# Patient Record
Sex: Female | Born: 1935 | Race: White | Hispanic: No | Smoking: Current every day smoker
Health system: Southern US, Community
[De-identification: ages and names within clinical notes are randomized; demographics above are authoritative.]

## PROBLEM LIST (undated history)

## (undated) DIAGNOSIS — I639 Cerebral infarction, unspecified: Secondary | ICD-10-CM

## (undated) DIAGNOSIS — R600 Localized edema: Secondary | ICD-10-CM

## (undated) DIAGNOSIS — E7800 Pure hypercholesterolemia, unspecified: Secondary | ICD-10-CM

## (undated) DIAGNOSIS — E079 Disorder of thyroid, unspecified: Secondary | ICD-10-CM

## (undated) DIAGNOSIS — I10 Essential (primary) hypertension: Secondary | ICD-10-CM

## (undated) DIAGNOSIS — I050 Rheumatic mitral stenosis: Secondary | ICD-10-CM

## (undated) DIAGNOSIS — I499 Cardiac arrhythmia, unspecified: Secondary | ICD-10-CM

## (undated) HISTORY — DX: Disorder of thyroid, unspecified: E07.9

## (undated) HISTORY — DX: Rheumatic mitral stenosis: I05.0

## (undated) HISTORY — DX: Cardiac arrhythmia, unspecified: I49.9

## (undated) HISTORY — DX: Localized edema: R60.0

---

## 1994-10-16 DIAGNOSIS — I050 Rheumatic mitral stenosis: Secondary | ICD-10-CM

## 1994-10-16 HISTORY — DX: Rheumatic mitral stenosis: I05.0

## 1994-10-16 HISTORY — PX: MITRAL VALVE SURGERY: SHX714

## 2004-03-08 HISTORY — PX: NM MYOCAR PERF WALL MOTION: HXRAD629

## 2009-11-06 ENCOUNTER — Emergency Department (HOSPITAL_COMMUNITY): Admission: EM | Admit: 2009-11-06 | Discharge: 2009-11-06 | Payer: Self-pay | Admitting: Emergency Medicine

## 2010-08-04 DIAGNOSIS — E039 Hypothyroidism, unspecified: Secondary | ICD-10-CM | POA: Insufficient documentation

## 2010-08-17 DIAGNOSIS — I639 Cerebral infarction, unspecified: Secondary | ICD-10-CM

## 2010-08-17 HISTORY — DX: Cerebral infarction, unspecified: I63.9

## 2010-08-17 HISTORY — PX: TRANSESOPHAGEAL ECHOCARDIOGRAM: SHX273

## 2010-09-05 ENCOUNTER — Inpatient Hospital Stay (HOSPITAL_COMMUNITY)
Admission: EM | Admit: 2010-09-05 | Discharge: 2010-09-09 | DRG: 066 | Disposition: A | Discharge status: Home / Self Care | Payer: Medicare Other | Attending: Internal Medicine | Admitting: Internal Medicine

## 2010-09-05 ENCOUNTER — Emergency Department (HOSPITAL_COMMUNITY): Payer: Medicare Other

## 2010-09-05 DIAGNOSIS — R791 Abnormal coagulation profile: Secondary | ICD-10-CM | POA: Diagnosis present

## 2010-09-05 DIAGNOSIS — F17200 Nicotine dependence, unspecified, uncomplicated: Secondary | ICD-10-CM | POA: Diagnosis present

## 2010-09-05 DIAGNOSIS — M216X9 Other acquired deformities of unspecified foot: Secondary | ICD-10-CM | POA: Diagnosis present

## 2010-09-05 DIAGNOSIS — Z79899 Other long term (current) drug therapy: Secondary | ICD-10-CM

## 2010-09-05 DIAGNOSIS — E039 Hypothyroidism, unspecified: Secondary | ICD-10-CM | POA: Diagnosis present

## 2010-09-05 DIAGNOSIS — I4891 Unspecified atrial fibrillation: Secondary | ICD-10-CM | POA: Diagnosis present

## 2010-09-05 DIAGNOSIS — Z954 Presence of other heart-valve replacement: Secondary | ICD-10-CM

## 2010-09-05 DIAGNOSIS — Z8673 Personal history of transient ischemic attack (TIA), and cerebral infarction without residual deficits: Secondary | ICD-10-CM

## 2010-09-05 DIAGNOSIS — Z7901 Long term (current) use of anticoagulants: Secondary | ICD-10-CM

## 2010-09-05 DIAGNOSIS — E785 Hyperlipidemia, unspecified: Secondary | ICD-10-CM | POA: Diagnosis present

## 2010-09-05 DIAGNOSIS — I498 Other specified cardiac arrhythmias: Secondary | ICD-10-CM | POA: Diagnosis not present

## 2010-09-05 DIAGNOSIS — I6340 Cerebral infarction due to embolism of unspecified cerebral artery: Principal | ICD-10-CM | POA: Diagnosis present

## 2010-09-05 DIAGNOSIS — Z23 Encounter for immunization: Secondary | ICD-10-CM

## 2010-09-05 DIAGNOSIS — I672 Cerebral atherosclerosis: Secondary | ICD-10-CM | POA: Diagnosis present

## 2010-09-05 DIAGNOSIS — I10 Essential (primary) hypertension: Secondary | ICD-10-CM | POA: Diagnosis present

## 2010-09-05 DIAGNOSIS — I6529 Occlusion and stenosis of unspecified carotid artery: Secondary | ICD-10-CM | POA: Diagnosis present

## 2010-09-05 LAB — CBC
Hemoglobin: 13.5 g/dL (ref 12.0–15.0)
MCHC: 32.5 g/dL (ref 30.0–36.0)
Platelets: 159 10*3/uL (ref 150–400)
RDW: 14.7 % (ref 11.5–15.5)

## 2010-09-05 LAB — COMPREHENSIVE METABOLIC PANEL
AST: 27 U/L (ref 0–37)
Albumin: 3.4 g/dL — ABNORMAL LOW (ref 3.5–5.2)
Alkaline Phosphatase: 56 U/L (ref 39–117)
BUN: 18 mg/dL (ref 6–23)
CO2: 29 mEq/L (ref 19–32)
Creatinine, Ser: 0.74 mg/dL (ref 0.4–1.2)
GFR calc Af Amer: >60 mL/min (ref >60)
GFR calc non Af Amer: >60 mL/min (ref >60)
Sodium: 137 mEq/L (ref 135–145)

## 2010-09-05 LAB — CK TOTAL AND CKMB (NOT AT ARMC)
CK, MB: 2.5 ng/mL (ref 0.3–4.0)
Total CK: 116 U/L (ref 7–177)

## 2010-09-05 LAB — HEMOGLOBIN A1C: Hgb A1c MFr Bld: 5.6 % (ref <5.7)

## 2010-09-05 MED ORDER — GADOBENATE DIMEGLUMINE 529 MG/ML IV SOLN
15.0000 mL | Freq: Once | INTRAVENOUS | Status: AC
  Administered 2010-09-05: 15 mL via INTRAVENOUS

## 2010-09-05 NOTE — H&P (Signed)
NAME:  Tina Wade, Tina Wade NO.:  [id number]  MEDICAL RECORD NO.:  [id number]           PATIENT TYPE:  E  LOCATION:  [facility]                         FACILITY:  [facility]  PHYSICIAN:  Jeoffrey Massed, MD    DATE OF BIRTH:  1929-05-07  DATE OF ADMISSION:  09/05/2010 DATE OF DISCHARGE:                             HISTORY & PHYSICAL   PRIMARY CARE PRACTITIONER:  Maryelizabeth Rowan, MD.  PRIMARY CARDIOLOGIST:  Garen Lah, MD, [facility], [facility].  CHIEF COMPLAINT:  Right-sided weakness since she woke up early this a.m.  HISTORY OF PRESENT ILLNESS:  The patient is a very pleasant 77 year old left-handed female with a past medical history of atrial fibrillation, history of mitral valve replacement with a mechanical valve on chronic Coumadin therapy, dyslipidemia, hypertension, smoker, comes in with the above-noted complaint.  Please note that this patient was last seen normal yesterday before she went to bed.  This morning when she woke up, she realized she had right-sided weakness.  She claims her legs were much more weaker than her right upper extremity.  She claims that she could not even stand and had difficulty walking to the bathroom.  To walk to the bathroom, she had to mostly lean on her left side and hold on to wall or different objects to get to the bathroom.  She denies any visual changes.  Denies any speech difficulty.  Denies any shortness of breath, chest pain, nausea, vomiting, diarrhea, or headaches.  Denies any urinary problems as well.  The patient does not have any fever, no cough as well.  The patient claims she has had TIAs twice in the past few years.  ALLERGIES:  The patient denies any allergies.  PAST MEDICAL HISTORY: 1. Hypertension. 2. Hypothyroidism. 3. Transient ischemic attacks x2 in the past few years. 4. Atrial fibrillation. 5. Dyslipidemia. 6. On chronic Coumadin therapy for atrial fibrillation and mechanical     mitral  valve. 7. Questionable history of carotid stenosis on the left side.  PAST SURGICAL HISTORY: 1. Tonsillectomy. 2. Status post mechanical/metallic valve replacement 17 years ago.  HOME MEDICATIONS:  Include, 1. Captopril. 2. Digoxin. 3. Lasix. 4. Levothyroxine. 5. Vytorin. 6. Coumadin.  FAMILY HISTORY:  Her sister had a stroke a few years back, otherwise nonsignificant to this current condition.  SOCIAL HISTORY:  The patient continues to smoke and now she is cut down to around 7-10 cigarettes a day and she has been a smoker for around 50 years.  She denies any toxic habits.  REVIEW OF SYSTEMS:  A detailed review of 12 systems were done and these are negative except for the ones mentioned in the HPI.  PHYSICAL EXAM:  VITAL SIGNS:  Last set of vital signs shows a heart rate of 72, blood pressure 124/67, respirations of 14, pulse ox of 96% on room air, afebrile. GENERAL:  An elderly white female lying in bed, does not appear to be any distress.  There is no facial droop.  She has clear speech. HEENT:  Atraumatic, normocephalic.  Pupils are equally reactive to light and accommodation.  Oral mucosa is moist. NECK:  Supple.  No JVD. CHEST:  Bilaterally clear to auscultation. CARDIOVASCULAR:  Heart sounds are regular.  No murmurs heard. ABDOMEN:  Soft, nontender, nondistended. EXTREMITIES:  No edema, no cyanosis. SKIN:  No rash. NEUROLOGIC:  The patient is alert and awake.  She is able to follow all commands and is able to communicate with very clear speech.  She has no gross sensory deficits.  Her motor strength on her left upper and left lower extremity are 5/5.  Her strength on her right upper extremity is around 4+/5.  Her strength on the left lower extremity is around 4/5 with no obvious footdrop on the right side.  Her plantars are equivocal, but tend to go up bilaterally.  She has brisk reflexes on the deep tendon reflexes, particularly on the knee on her right side  compared to the left.  LABORATORY DATA:  CBC shows WBC of 7.6, hemoglobin of 13.5, hematocrit of 41.5, MCV of 93.5, and platelet count of 159.  INR is 2.25. Chemistry shows sodium of 137, potassium of 4.2, chloride of 102, bicarb of 29, glucose of 105, BUN of 18, creatinine of 0.74.  LFT shows a total bilirubin of 0.7, alkaline phosphatase of 56, AST of 27, ALT of 16, total protein of 7.0, albumin of 3.5, and calcium of 9.0.  Digoxin level is 0.9.  RADIOLOGICAL STUDIES:  CT of the head done on 2010-10-02, shows no acute intracranial hemorrhage.  Remote infarction, small vessel disease type changes without CT evidence of large acute infarct as noted above.  EKG shows atrial fibrillation with a rate around 70 beats per minute.  ASSESSMENT: 1. Sudden onset of right-sided weakness, mostly resolved now with     mostly a persistant right footdrop, given her symptomatology obviously     cerebrovascular accident is a concern. 2. Coagulopathy secondary to chronic Coumadin therapy. 3. Rate-controlled atrial fibrillation, on chronic Coumadin therapy.  PLAN: 1. The patient will be admitted to the Neuro Telemetry Unit. 2. This patient, although she has a metallic mitral valve and dental    implants both of which were placed more than 10 years ago has had     an MRI of her brain done last year without any incident, so we will     go ahead and repeat a MRI/MRA of her brain.  We will also obtain an     MRA of her neck.  We will also go ahead and order a 2-D echo as     well. 3. We will place her on aspirin and continue her on simvastatin. 4. A Neurology consultation has also been called for. 5. Further plan will depend as the patient's clinical situation     evolves, and further input from Neurology as well as further     results pending radiological studies. 6. The patient will be monitored and followed closely on a day-to-day     basis. 7. DVT prophylaxis is not needed as her INR is  therapeutic.  CODE STATUS:  The patient is a full code.  Total time spent 45 minutes.     Jeoffrey Massed, MD     SG/MEDQ  D:  Oct 02, 2010  T:  2010-10-02  Job:  [id number]  cc:   Maryelizabeth Rowan, M.D. Rollene Rotunda, MD, [facility]  Electronically Signed by Jeoffrey Massed  on 02-Oct-2010 04:37:11 PM

## 2010-09-06 ENCOUNTER — Inpatient Hospital Stay (HOSPITAL_COMMUNITY): Payer: Medicare Other

## 2010-09-06 LAB — PROTIME-INR: INR: 2.49 — ABNORMAL HIGH (ref 0.00–1.49)

## 2010-09-06 LAB — URINALYSIS, MICROSCOPIC ONLY
Urobilinogen, UA: 0.2 mg/dL (ref 0.0–1.0)
pH: 7 (ref 5.0–8.0)

## 2010-09-06 LAB — GLUCOSE, CAPILLARY: Glucose-Capillary: 94 mg/dL (ref 70–99)

## 2010-09-06 LAB — LIPID PANEL
Cholesterol: 114 mg/dL (ref 0–200)
LDL Cholesterol: 52 mg/dL (ref 0–99)
Triglycerides: 101 mg/dL (ref <150)

## 2010-09-07 LAB — GLUCOSE, CAPILLARY
Glucose-Capillary: 105 mg/dL — ABNORMAL HIGH (ref 70–99)
Glucose-Capillary: 91 mg/dL (ref 70–99)
Glucose-Capillary: 125 mg/dL — ABNORMAL HIGH (ref 70–99)
Glucose-Capillary: 110 mg/dL — ABNORMAL HIGH (ref 70–99)

## 2010-09-07 LAB — PROTIME-INR: Prothrombin Time: 28 seconds — ABNORMAL HIGH (ref 11.6–15.2)

## 2010-09-08 LAB — DIGOXIN LEVEL: Digoxin Level: 1.1 ng/mL (ref 0.8–2.0)

## 2010-09-08 LAB — GLUCOSE, CAPILLARY

## 2010-09-08 LAB — MAGNESIUM: Magnesium: 2.5 mg/dL (ref 1.5–2.5)

## 2010-09-08 LAB — POTASSIUM: Potassium: 4.5 mEq/L (ref 3.5–5.1)

## 2010-09-09 LAB — GLUCOSE, CAPILLARY: Glucose-Capillary: 106 mg/dL — ABNORMAL HIGH (ref 70–99)

## 2010-09-09 LAB — CBC
HCT: 41.9 % (ref 36.0–46.0)
Hemoglobin: 13.6 g/dL (ref 12.0–15.0)
MCH: 30.9 pg (ref 26.0–34.0)
RBC: 4.4 MIL/uL (ref 3.87–5.11)

## 2010-09-09 LAB — PROTIME-INR: Prothrombin Time: 30.2 seconds — ABNORMAL HIGH (ref 11.6–15.2)

## 2010-09-10 NOTE — Discharge Summary (Signed)
NAME:  Tina Wade, Tina Wade              ACCOUNT NO.:  [id number]  MEDICAL RECORD NO.:  [id number]           PATIENT TYPE:  I  LOCATION:  3005                         FACILITY:  [facility]  PHYSICIAN:  Andreas Blower, MD       DATE OF BIRTH:  11-16-1928  DATE OF ADMISSION:  09/05/2010 DATE OF DISCHARGE:  09/09/2010                              DISCHARGE SUMMARY   PRIMARY CARE PHYSICIAN:  Dr. Duanne Guess.  PRIMARY CARDIOLOGIST:  [facility].  DISCHARGE DIAGNOSES: 1. Multifocal areas of acute cortical infarcts. 2. Bradycardia/slow heart rate. 3. History of atrial fibrillation. 4. Hypothyroidism. 5. History of transient ischemic attacks x2. 6. Hyperlipidemia. 7. On chronic anticoagulation for atrial fibrillation and mechanical     mitral valve with slow INR between 3 and 3.5. 8. Mechanical mitral valve. 9. A 50-75% stenosis of proximal left internal carotid artery.  DISCHARGE MEDICATIONS: 1. Aspirin 81 mg p.o. daily 2. Polyethylene glycol 17 g p.o. daily. 3. Warfarin 7.5 mg p.o. daily. 4. Acetaminophen 325 mg 1-2 tablets 3 times a day as needed. 5. Calcium carbonate over the counter 1 tablet p.o. daily. 6. Captopril 12.5 mg p.o. 3 times a day. 7. Furosemide 40 mg p.o. daily 8. Levothyroxine 112 mcg p.o. daily. 9. Miconazole vaginal cream 1 application topically daily as needed     for yeast infections. 10.Multivitamins 1 tablet p.o. daily. 11.Ezetimibe/simvastatin 10/20 one tablet p.o. daily.  RADIOLOGY/IMAGING: 1. The patient had a CT of the head without contrast, which shows no     intracranial hemorrhage.  Remote infarcts and small-vessel disease     type changes without CT evidence of large acute infarct. 2. The patient had an MRA of the head without contrast, MRA of the     neck with and without contrast and MRI of the head with and without     contrast on September 05, 2010, which showed multifocal areas of     acute cortical infarctions, question emboli.  The  patient has left     posterior frontal parasagittal acute infarct, which correlates with     right foot weakness. 3. MRA of the head is negative.  MRA of the neck showed 50-70%     stenosis in the proximal left internal carotid artery. 4. The patient had chest x-ray 2-view, which shows no acute     cardiopulmonary process status post aortic valve replacement. 5. The patient had a transesophageal echocardiogram on September 08, 2010, which showed that her left ventricle had mild concentric     hypertrophy.  Systolic function was normal.  Ejection fraction was     55-60%.  Wall motion was normal.  Aortic valve showed moderate     regurgitation, mitral valve showed mildly calcified annulus.  A St.     Jude mechanical prosthesis was present and was functioning     normally.  Left atrium showed spontaneous echo contrast (smoke in     the cavity).  The source of embolus likely from the left atrium.  LABORATORY DATA:  CBC shows white count of 7.7, hemoglobin 13.6, hematocrit 41.9, platelet count 161.  INR is 2.88.  Electrolytes normal with a creatinine of 0.74.  Liver function tests normal except albumin is 3.48, magnesium is 2.5.  Hemoglobin A1c is 5.6.  LDL is 52.  UA was negative for nitrites and leukocytes.  CONSULTATIONS:  [facility] was consulted during the course of the hospital stay and Neurology was consulted during the course of the hospital stay.  DISPOSITION AND FOLLOWUP:  The patient to follow up with her primary care physician in 1 week.  The patient to follow up with Dr. Lynnea Ferrier, office will call for appointment, date and time.  The patient to have her anticoagulation checked at Dr. Donavan Burnet office early next week. Dr. Donavan Burnet office will call with an appointment, date and time.  HOSPITAL COURSE BY PROBLEM: 1. Acute multifocal CVA, most likely from left atrium.  Neurology and     Cardiology was consulted.  The above imaging was obtained.      Neurology and Cardiology agreed that her goal INR should be     increased to 3.0-3.5.  In regards to her right foot drop, a brace     was obtained and the patient ambulated with the brace.  The patient     will receive outpatient physical therapy and occupational therapy. 2. Bradycardia/slow heart rate.  During the course of the hospital     stay, the patient had a slow heart rate and was bradycardic while     the patient was getting a TEE.  She was bradycardic with a heart     rate in 30s.  Cardiology recommended discontinuing the digoxin.     Further followup with Dr. Lynnea Ferrier, Cardiology as outpatient. 3. AFib.  The patient anticoagulated.  Heart rate is controlled. 4. Hypothyroidism.  Continue levothyroxine. 5. Hypertension, stable.  Continue the patient on home medications.     Further titration of antihypertensive medications to be done as     outpatient.  Time spent on discharge talking to the patient, talking to the consultants, and coordinating care was 35 minutes.     Andreas Blower, MD     SR/MEDQ  D:  09/09/2010  T:  09/10/2010  Job:  [id number]  Electronically Signed by Wardell Heath Artelia Game  on 09/10/2010 08:08:05 PM

## 2010-09-14 NOTE — Consult Note (Signed)
NAME:  Tina Wade, Tina Wade              ACCOUNT NO.:  [id number]  MEDICAL RECORD NO.:  [id number]           PATIENT TYPE:  I  LOCATION:  3005                         FACILITY:  [facility]  PHYSICIAN:  Ritta Slot, MD     DATE OF BIRTH:  16-Jan-1929  DATE OF CONSULTATION:  09/06/2010 DATE OF DISCHARGE:                                CONSULTATION   HISTORY OF PRESENT ILLNESS:  The patient is a pleasant 77 year old female who had been seen by our group back in March 2011.  She has a history of valvular heart disease and had a St. Jude Mitral valve implanted in [place] in 1996.  She had no coronary disease.  She then moved to [facility] and had spent last several years out there.  She recently moved back to the area.  She was seen in our office and set up for an echocardiogram, which showed good LV function and no significant valvular abnormalities.  Dr.Eichorne who saw her at that time did offer her a Myoview because she has some risk factors for coronary disease, but the patient declined.  She has some moderate carotid disease and we did do carotid Doppler and lower extremity arterial Doppler.  Lower extremity arterial Doppler revealed ABIs of 1.1 on the right and 1.07 on the left.  A carotid Doppler showed less than 49% left internal carotid artery narrowing.  Previous adenosine Myoview had been done in [facility] in 2005 and showed a question of mild ischemia in the anterior apical walls.  This was felt to be a subtle defect.  The patient has not had symptoms of angina.  She had been followed by Dr. Larina Bras and then her care was transferred to Dr. Maryelizabeth Rowan in December 2011.  I called Dr. Chauncy Passy office, they had seen her on August 04, 2010 and her INR was 2.5.  The patient was admitted to the emergency room on September 05, 2010 with numbness and weakness in the right arm and right leg.  Her INR on admission was 2.49.  MRI showed multiple areas of acute cortical  infarct, question embolic.  The patient has improved since admission, but continues to have some very mild right upper extremity grip weakness and still some significant right lower extremity weakness.  She says her INR has been checked monthly and has been stable.  As noted above, the last INR I have is from August 04, 2010 and it was 2.5.  We were asked to see her for further evaluation.  PAST MEDICAL HISTORY:  Remarkable for chronic atrial fibrillation with controlled ventricular response.  She has treated hypothyroidism.  She has treated hypertension.  She has treated dyslipidemia.  HOME MEDICATIONS: 1. Capoten 12.5 t.i.d. 2. Lasix 40 mg a day. 3. Coumadin 5 mg a day. 4. Vytorin 10/20 daily. 5. Multivitamin daily. 6. Synthroid 0.112 mg a day. 7. Lanoxin 0.25 mg a day.  ALLERGIES:  She has no known drug allergies.  SOCIAL HISTORY:  She is a widow.  She has 6 children, 12 grandchildren. She smokes a half-pack a day and drinks alcohol occasionally.  FAMILY HISTORY:  Remarkable that the patient's mother died at 81 from coronary disease, which was diagnosed in her late 21s.  The patient's father died at 56 of TB.  REVIEW OF SYSTEMS:  Essentially unremarkable except for noted above. Please see history and physical for complete details.  She did complain to me of some right ear pain and asked that to evaluate this.  PHYSICAL EXAM:  VITAL SIGNS:  Blood pressure 134/75, pulse 68, temp 97.6, room air sat is 94%. GENERAL:  She is an elderly female, in no acute distress. HEENT:  Grossly normocephalic.  Extraocular movements are intact. Sclerae are nonicteric.  I did examine her ears, the right ear was very tender and I could not get a good look in canal, there was cerumen noted.  There is no obvious mass noted.  There was no obvious erythema. The left ear exam was essentially normal with some wax noted in the canal, but no obstruction. NECK:  Without JVD or bruit. CHEST:  Clear to  auscultation and percussion. CARDIAC EXAM:  Reveals irregularly irregular rhythm with a soft systolic murmur and positive bowel sounds. ABDOMEN:  Not tender, not distended. EXTREMITIES:  Without edema.  Distal pulses are faint, but intact. NEURO EXAM:  Grossly intact, she does have some mild grip weakness compared to the left in the upper extremities and some right footdrop noted.  She is awake, alert, oriented, and cooperative. SKIN:  Cool and dry.  LABORATORY DATA:  INR is 2.49, INR on admission was 2.25.  Sodium 137, potassium 4.2, BUN 18, creatinine 0.74.  Liver functions are normal. Hemoglobin A1c is 5.6.  CK and troponin are negative.  Cholesterol is 111, HDL 42, LDL 52.  MRI shows multiple areas of acute cortical infarct, question embolic.  MRA of the neck shows a 50-75% left internal carotid artery stenosis.  Carotid Doppler have been done and the results are pending.  EKG shows atrial fibrillation with controlled ventricular response and narrow complex QRS LVH by voltage.  IMPRESSION: 1. Embolic cerebrovascular accident in the setting of subtherapeutic     INR of 2.25. 2. St. Jude mitral valve in [place], [place], in 1996. 3. Moderate vascular disease in the left internal carotid artery,     Doppler are pending, but Doppler in our office in October of 2011     showed less than 49% narrowing. 4. Smoking history. 5. Treated hypothyroidism. 6. Treated dyslipidemia. 7. Treated hypertension. 8. Mildly abnormal Myoview in 2005 with no history of coronary disease     and no history of chest pain.  PLAN:  The patient will be seen by cardiologist this afternoon.  She will need closer followup of her INR with a goal of 2.5 to 3.5, although as noted at Dr. Chauncy Passy office her INR was 2.5.     Abelino Derrick, P.A.   ______________________________ Ritta Slot, MD    LKK/MEDQ  D:  09/06/2010  T:  09/06/2010  Job:  [id number]  cc:   Ritta Slot, MD Maryelizabeth Rowan, M.D.  Electronically Signed by Corine Shelter P.A. on 09/07/2010 12:26:54 PM Electronically Signed by Ritta Slot MD on 09/14/2010 02:27:14 PM

## 2010-09-15 ENCOUNTER — Ambulatory Visit: Payer: Medicare Other | Attending: Internal Medicine

## 2010-09-15 DIAGNOSIS — M6281 Muscle weakness (generalized): Secondary | ICD-10-CM | POA: Insufficient documentation

## 2010-09-15 DIAGNOSIS — IMO0001 Reserved for inherently not codable concepts without codable children: Secondary | ICD-10-CM | POA: Insufficient documentation

## 2010-09-15 DIAGNOSIS — R269 Unspecified abnormalities of gait and mobility: Secondary | ICD-10-CM | POA: Insufficient documentation

## 2010-09-19 ENCOUNTER — Ambulatory Visit: Payer: Medicare Other

## 2010-09-21 ENCOUNTER — Ambulatory Visit: Payer: Medicare Other

## 2010-09-26 ENCOUNTER — Ambulatory Visit: Payer: Medicare Other

## 2010-09-28 ENCOUNTER — Ambulatory Visit: Payer: Medicare Other

## 2010-10-04 LAB — POCT CARDIAC MARKERS: Myoglobin, poc: 171 ng/mL (ref 12–200)

## 2010-10-04 LAB — COMPREHENSIVE METABOLIC PANEL
ALT: 24 U/L (ref 0–35)
Alkaline Phosphatase: 72 U/L (ref 39–117)
CO2: 32 mEq/L (ref 19–32)
GFR calc non Af Amer: >60 mL/min (ref >60)
Glucose, Bld: 109 mg/dL — ABNORMAL HIGH (ref 70–99)
Potassium: 4 mEq/L (ref 3.5–5.1)
Sodium: 139 mEq/L (ref 135–145)

## 2010-10-04 LAB — URINALYSIS, ROUTINE W REFLEX MICROSCOPIC
Bilirubin Urine: NEGATIVE
Hgb urine dipstick: NEGATIVE
Ketones, ur: NEGATIVE mg/dL
Protein, ur: NEGATIVE mg/dL
Specific Gravity, Urine: 1.007 (ref 1.005–1.030)
Urobilinogen, UA: 0.2 mg/dL (ref 0.0–1.0)

## 2010-10-04 LAB — CBC
HCT: 46.7 % — ABNORMAL HIGH (ref 36.0–46.0)
Hemoglobin: 15.3 g/dL — ABNORMAL HIGH (ref 12.0–15.0)
MCHC: 32.7 g/dL (ref 30.0–36.0)
RBC: 5.03 MIL/uL (ref 3.87–5.11)

## 2010-10-04 LAB — DIFFERENTIAL
Basophils Absolute: 0 10*3/uL (ref 0.0–0.1)
Basophils Relative: 0 % (ref 0–1)
Eosinophils Absolute: 0.1 10*3/uL (ref 0.0–0.7)
Neutrophils Relative %: 81 % — ABNORMAL HIGH (ref 43–77)

## 2010-10-04 LAB — PROTIME-INR
INR: 2.5 — ABNORMAL HIGH (ref 0.00–1.49)
Prothrombin Time: 26.8 seconds — ABNORMAL HIGH (ref 11.6–15.2)

## 2010-10-05 ENCOUNTER — Ambulatory Visit: Payer: Medicare Other

## 2010-10-06 ENCOUNTER — Ambulatory Visit: Payer: Medicare Other

## 2010-10-10 ENCOUNTER — Ambulatory Visit: Payer: Medicare Other

## 2010-10-12 ENCOUNTER — Ambulatory Visit: Payer: Medicare Other

## 2010-10-17 ENCOUNTER — Ambulatory Visit: Payer: Medicare Other | Attending: Internal Medicine

## 2010-10-17 DIAGNOSIS — IMO0001 Reserved for inherently not codable concepts without codable children: Secondary | ICD-10-CM | POA: Insufficient documentation

## 2010-10-17 DIAGNOSIS — R269 Unspecified abnormalities of gait and mobility: Secondary | ICD-10-CM | POA: Insufficient documentation

## 2010-10-17 DIAGNOSIS — M6281 Muscle weakness (generalized): Secondary | ICD-10-CM | POA: Insufficient documentation

## 2010-10-18 DIAGNOSIS — Z8673 Personal history of transient ischemic attack (TIA), and cerebral infarction without residual deficits: Secondary | ICD-10-CM | POA: Insufficient documentation

## 2010-10-19 ENCOUNTER — Ambulatory Visit: Payer: Medicare Other

## 2010-10-25 ENCOUNTER — Ambulatory Visit: Payer: Medicare Other

## 2010-10-27 ENCOUNTER — Ambulatory Visit: Payer: Medicare Other

## 2010-10-31 ENCOUNTER — Ambulatory Visit: Payer: Medicare Other

## 2010-11-02 ENCOUNTER — Ambulatory Visit: Payer: Medicare Other

## 2010-11-07 ENCOUNTER — Ambulatory Visit: Payer: Medicare Other

## 2010-11-09 ENCOUNTER — Ambulatory Visit: Payer: Medicare Other

## 2010-11-14 ENCOUNTER — Ambulatory Visit: Payer: Medicare Other

## 2010-11-16 ENCOUNTER — Ambulatory Visit: Payer: Medicare Other | Attending: Internal Medicine

## 2010-11-16 DIAGNOSIS — R269 Unspecified abnormalities of gait and mobility: Secondary | ICD-10-CM | POA: Insufficient documentation

## 2010-11-16 DIAGNOSIS — M6281 Muscle weakness (generalized): Secondary | ICD-10-CM | POA: Insufficient documentation

## 2010-11-16 DIAGNOSIS — IMO0001 Reserved for inherently not codable concepts without codable children: Secondary | ICD-10-CM | POA: Insufficient documentation

## 2010-11-22 ENCOUNTER — Ambulatory Visit: Payer: Medicare Other

## 2010-11-22 NOTE — Consult Note (Signed)
Tina Wade, Tina Wade              ACCOUNT NO.:  [id number]  MEDICAL RECORD NO.:  [id number]           PATIENT TYPE:  I  LOCATION:  3005                         FACILITY:  [facility]  PHYSICIAN:  Melvyn Novas, M.D.  DATE OF BIRTH:  [birth year]  DATE OF CONSULTATION:  09/05/2010 DATE OF DISCHARGE:                                CONSULTATION   CHIEF COMPLAINT:  Right lower extremity weakness.  HISTORY OF PRESENT ILLNESS:  A 77 year old woman with past medical history of mitral valve prolapse, on Coumadin, comes to the ED for right lower extremity weakness.  The patient reports that she had no symptoms before going to sleep.  The patient woke up with right lower extremity weakness.  Denies upper extremity involvement.  Denies trauma, headache, fever, or chills.  The patient had resolved right lower leg weakness, except for a foot drop on the right side.  Neurology consulted for further evaluation.  PAST MEDICAL HISTORY: 1. Hypertension. 2. TIA, April 2011. 3. Hypothyroidism.  SURGICAL HISTORY: 1. Mitral valve replacement. 2. Cataract surgery.  MEDICATIONS: 1. Warfarin 5 mg p.o. daily as directed. 2. Tylenol 325 mg 1-2 tablets p.o. t.i.d. p.r.n. pain. 3. Multivitamin 1 tablet p.o. daily. 4. Calcium carbonate p.o. 1 tablet daily. 5. Levothyroxine 112 mcg p.o. daily. 6. Lasix 40 mg p.o. daily. 7. Digoxin 0.25 p.o. daily. 8. Captopril 12.5 mg p.o. daily. 9. Vytorin 10/20 mg p.o. daily.  ALLERGIES:  No known drug allergies.  FAMILY HISTORY:  Noncontributory.  SOCIAL HISTORY:  Smoking history and occasional alcohol drinker.  REVIEW OF SYSTEMS:  All negative as per HPI.  PHYSICAL EXAM:  VITAL SIGNS:  Blood pressure 163/67, pulse 69, respiratory rate 20, temperature 97.6. MENTAL STATUS:  Alert and oriented x3, carries out two-step commands. CRANIAL NERVES:  Eyes:  EOMI, PERRLA.  Visual field intact. NEUROLOGIC:  Face symmetrical.  Tongue midline.  Uvula midline.   No dysarthria.  Sensation normal.  Shoulder shrug and head turn normal. Coordination:  Finger-to-nose normal.  Heel-to-shin normal.  Fine motor movement normal.  Deep tendon reflexes, right lower extremity 0, all other extremities 1.  Motor:  4/5 right plantar flexion, 3/5 right dorsi flexion, 5/5 strength in lower left extremity, upper left extremity and right upper extremity normal.  Drift none. SKIN:  No rashes. EXTREMITIES:  2+ pulses throughout. PULMONARY:  Clear to auscultation bilaterally. CARDIOVASCULAR:  Regular rate and rhythm.  No murmurs, rubs, or gallops. NECK:  Supple.  Sensation normal.  LABS:  Sodium 137, potassium 4.2, chloride 102, bicarb 29, BUN 18, creatinine 0.74, glucose 105.  White blood cells 7.6, hemoglobin 13.5, hematocrit 41.5, platelets 159.  PT 25, INR 2.25, digoxin 0.9.  IMAGING TEST:  CT of the head, impression: 1. No intracranial hemorrhage. 2. Remote infarct and small vessel disease type changes without CT     evidence of large acute infarct.  ASSESSMENT:  Right foot drop most likely peripheral nerve injury.  Check MRI of the head.  PT consult done for continued physical therapy.  The patient will need a foot brace.  Neurology will follow as needed.     Danne Harbor, MD  ______________________________ Melvyn Novas, M.D.    RV/MEDQ  D:  09/05/2010  T:  09/06/2010  Job:  [id number]  Electronically Signed by Danne Harbor MD on 10/19/2010 01:59:59 PM Electronically Signed by Melvyn Novas M.D. on 11/22/2010 12:05:40 PM

## 2010-12-05 ENCOUNTER — Ambulatory Visit: Payer: Medicare Other

## 2010-12-11 ENCOUNTER — Emergency Department (HOSPITAL_COMMUNITY)
Admission: EM | Admit: 2010-12-11 | Discharge: 2010-12-11 | Disposition: A | Discharge status: Home / Self Care | Payer: Medicare Other | Attending: Emergency Medicine | Admitting: Emergency Medicine

## 2010-12-11 DIAGNOSIS — I10 Essential (primary) hypertension: Secondary | ICD-10-CM | POA: Insufficient documentation

## 2010-12-11 DIAGNOSIS — J3489 Other specified disorders of nose and nasal sinuses: Secondary | ICD-10-CM | POA: Insufficient documentation

## 2010-12-11 DIAGNOSIS — Z8673 Personal history of transient ischemic attack (TIA), and cerebral infarction without residual deficits: Secondary | ICD-10-CM | POA: Insufficient documentation

## 2010-12-11 DIAGNOSIS — E039 Hypothyroidism, unspecified: Secondary | ICD-10-CM | POA: Insufficient documentation

## 2010-12-11 DIAGNOSIS — R040 Epistaxis: Secondary | ICD-10-CM | POA: Insufficient documentation

## 2010-12-11 DIAGNOSIS — Z7901 Long term (current) use of anticoagulants: Secondary | ICD-10-CM | POA: Insufficient documentation

## 2010-12-11 DIAGNOSIS — Z8679 Personal history of other diseases of the circulatory system: Secondary | ICD-10-CM | POA: Insufficient documentation

## 2010-12-11 LAB — PROTIME-INR
INR: 2.46 — ABNORMAL HIGH (ref 0.00–1.49)
Prothrombin Time: 26.8 seconds — ABNORMAL HIGH (ref 11.6–15.2)

## 2010-12-11 LAB — CBC
Hemoglobin: 12.7 g/dL (ref 12.0–15.0)
MCH: 31.1 pg (ref 26.0–34.0)
RBC: 4.09 MIL/uL (ref 3.87–5.11)

## 2010-12-11 LAB — DIFFERENTIAL
Basophils Relative: 0 % (ref 0–1)
Monocytes Relative: 10 % (ref 3–12)
Neutro Abs: 5.6 10*3/uL (ref 1.7–7.7)
Neutrophils Relative %: 68 % (ref 43–77)

## 2011-04-04 DIAGNOSIS — Z87891 Personal history of nicotine dependence: Secondary | ICD-10-CM | POA: Insufficient documentation

## 2011-06-18 ENCOUNTER — Emergency Department (HOSPITAL_COMMUNITY)
Admission: EM | Admit: 2011-06-18 | Discharge: 2011-06-18 | Disposition: A | Discharge status: Home / Self Care | Payer: Medicare Other | Attending: Emergency Medicine | Admitting: Emergency Medicine

## 2011-06-18 ENCOUNTER — Encounter: Payer: Self-pay | Admitting: Emergency Medicine

## 2011-06-18 DIAGNOSIS — Z7982 Long term (current) use of aspirin: Secondary | ICD-10-CM | POA: Insufficient documentation

## 2011-06-18 DIAGNOSIS — Z79899 Other long term (current) drug therapy: Secondary | ICD-10-CM | POA: Insufficient documentation

## 2011-06-18 DIAGNOSIS — R040 Epistaxis: Secondary | ICD-10-CM

## 2011-06-18 DIAGNOSIS — Z8673 Personal history of transient ischemic attack (TIA), and cerebral infarction without residual deficits: Secondary | ICD-10-CM | POA: Insufficient documentation

## 2011-06-18 DIAGNOSIS — E7800 Pure hypercholesterolemia, unspecified: Secondary | ICD-10-CM | POA: Insufficient documentation

## 2011-06-18 DIAGNOSIS — Z7901 Long term (current) use of anticoagulants: Secondary | ICD-10-CM | POA: Insufficient documentation

## 2011-06-18 DIAGNOSIS — I10 Essential (primary) hypertension: Secondary | ICD-10-CM | POA: Insufficient documentation

## 2011-06-18 HISTORY — DX: Pure hypercholesterolemia, unspecified: E78.00

## 2011-06-18 HISTORY — DX: Cerebral infarction, unspecified: I63.9

## 2011-06-18 HISTORY — DX: Essential (primary) hypertension: I10

## 2011-06-18 LAB — CBC
HCT: 32 % — ABNORMAL LOW (ref 36.0–46.0)
Hemoglobin: 10.6 g/dL — ABNORMAL LOW (ref 12.0–15.0)
MCV: 93 fL (ref 78.0–100.0)
RBC: 3.44 MIL/uL — ABNORMAL LOW (ref 3.87–5.11)
RDW: 15.5 % (ref 11.5–15.5)
WBC: 7.6 10*3/uL (ref 4.0–10.5)

## 2011-06-18 LAB — BASIC METABOLIC PANEL
CO2: 30 mEq/L (ref 19–32)
Calcium: 8.6 mg/dL (ref 8.4–10.5)
Potassium: 3.4 mEq/L — ABNORMAL LOW (ref 3.5–5.1)
Sodium: 135 mEq/L (ref 135–145)

## 2011-06-18 LAB — PROTIME-INR: INR: 5.31 (ref 0.00–1.49)

## 2011-06-18 NOTE — ED Provider Notes (Signed)
History     CSN: [id number] Arrival date & time: 06/18/2011  4:34 PM   First MD Initiated Contact with Patient 06/18/11 1758      Chief Complaint  Patient presents with  . Epistaxis    (Consider location/radiation/quality/duration/timing/severity/associated sxs/prior treatment) Patient is a 77 y.o. female presenting with nosebleeds. The history is provided by the patient.  Epistaxis  The current episode started 6 to 12 hours ago (Today at 6:00 in the morning). The problem occurs constantly. The problem has been resolved. The problem is associated with anticoagulants. The bleeding has been from both (But predominantly right side) nares. She has tried applying pressure for the symptoms. Her past medical history is significant for bleeding disorder. Her past medical history does not include sinus problems, nose-picking or frequent nosebleeds.   Patient is on Coumadin status post mitral valve replacement. Her primary care doctors are keeping her INR in a range of 3-3.5, her Coumadin was recently adjusted from taking 5 mg a day to taking 7.5 mg a day on Monday Wednesdays and Friday. Patient is also taking aspirin daily. Currently the nosebleed has stopped that was at approximately 6:00 PM. Patient has a history of nosebleed in the past that occurred [place]. This required packing and she was seen by ear nose and throat doctor there and upon return home follow up with ear nose and throat here. However she cannot remember who it was but she has those records at home. The ear nose and throat doctors discovered that she has a septal defect. However other than the packing no specific therapy was recommended. No history of any bleeding since June until today.   Patient also gives a history of black stool for several days, this has preceded the history of the nosebleed. No other complaints.   Past Medical History  Diagnosis Date  . Stroke   . Hypertension   . High cholesterol      No past surgical history on file.  No family history on file.  History  Substance Use Topics  . Smoking status: Current Everyday Smoker  . Smokeless tobacco: Not on file  . Alcohol Use: No    OB History    Grav Para Term Preterm Abortions TAB SAB Ect Mult Living                  Review of Systems  Constitutional: Negative for fever.  HENT: Positive for nosebleeds. Negative for congestion, trouble swallowing and neck pain.   Eyes: Negative for visual disturbance.  Respiratory: Negative for cough and shortness of breath.   Cardiovascular: Negative for chest pain.  Gastrointestinal: Negative for nausea, vomiting, abdominal pain and diarrhea.  Genitourinary: Negative for dysuria.  Musculoskeletal: Negative for back pain.  Neurological: Negative for weakness and headaches.  Hematological: Bruises/bleeds easily.    Allergies  Review of patient's allergies indicates no known allergies.  Home Medications   Current Outpatient Rx  Name Route Sig Dispense Refill  . ASPIRIN 81 MG PO TABS Oral Take 81 mg by mouth daily.      Marland Kitchen CALCIUM CARBONATE 1500 MG PO TABS Oral Take 1,500 mg by mouth daily.      Marland Kitchen EZETIMIBE-SIMVASTATIN 10-20 MG PO TABS Oral Take 1 tablet by mouth at bedtime.      . FUROSEMIDE 40 MG PO TABS Oral Take 40 mg by mouth 2 (two) times daily.      Marland Kitchen LEVOTHYROXINE SODIUM 112 MCG PO TABS Oral Take 112  mcg by mouth daily.      Marland Kitchen LOSARTAN POTASSIUM 100 MG PO TABS Oral Take 100 mg by mouth daily.      Carma Leaven M PLUS PO TABS Oral Take 1 tablet by mouth daily.      . WARFARIN SODIUM 5 MG PO TABS Oral Take 5-7.5 mg by mouth daily. Take 7.5 mg on mondays, wednesdays and fridays. Take 5 mg the rest of the week.       BP 126/51  Pulse 61  Temp(Src) 97.9 F (36.6 C) (Oral)  Resp 18  SpO2 95%  Physical Exam  Nursing note and vitals reviewed. Constitutional: She is oriented to person, place, and time. She appears well-developed and well-nourished. No distress.  HENT:   Head: Normocephalic and atraumatic.  Mouth/Throat: Oropharynx is clear and moist.       Dried blood around both nares no blood in the hypopharynx area. Blood clot in the right nares. No active bleeding.  Neck: Normal range of motion. Neck supple.  Cardiovascular: Normal rate and regular rhythm.   Pulmonary/Chest: Effort normal and breath sounds normal. She has no rales.  Abdominal: Soft. Bowel sounds are normal. There is no tenderness.  Musculoskeletal: Normal range of motion. She exhibits no edema.  Neurological: She is alert and oriented to person, place, and time. No cranial nerve deficit. She exhibits normal muscle tone. Coordination normal.  Skin: Skin is warm. No rash noted.    ED Course  EPISTAXIS MANAGEMENT Performed by: Shelda Jakes. Authorized by: Shelda Jakes. Risks and benefits: risks, benefits and alternatives were discussed Patient understanding: patient states understanding of the procedure being performed Patient identity confirmed: verbally with patient Time out: Immediately prior to procedure a "time out" was called to verify the correct patient, procedure, equipment, support staff and site/side marked as required. Comments: Rhino Rocket 5.5 cm placed in the right nares. Prior to placement it was greased with bacitracin ointment. After placement in the right nares which patient tolerated well the balloon was inflated the patient felt pressure that she could tolerate.    (including critical care time)  Labs Reviewed  CBC - Abnormal; Notable for the following:    RBC 3.44 (*)    Hemoglobin 10.6 (*)    HCT 32.0 (*)    All other components within normal limits  PROTIME-INR - Abnormal; Notable for the following:    Prothrombin Time 49.4 (*)    INR 5.31 (*)    All other components within normal limits  BASIC METABOLIC PANEL - Abnormal; Notable for the following:    Potassium 3.4 (*)    Glucose, Bld 106 (*)    GFR calc non Af Amer 79 (*)    All other  components within normal limits     1. Epistaxis       MDM   As stated above patient with a history of nosebleed back in June. No nosebleeds since then until today. Onset today was at 6:00 in the morning. But, saw patient this evening the nosebleed did stop it she stated that touch very concerned about a recurring so I Rhino Rocket was placed in her right nares which was the site of the per dominant bleeding. She tolerated placement of Rhino Rocket fine. INR is elevated at 5.3, patient informed of this. She will hold Coumadin and has followup with her primary care provider tomorrow. She will follow up with ear nose and throat doctor that saw her after the incident in June here  locally, to have the packing Rhino Rocket removed in 2-3 days.         Shelda Jakes, MD 06/18/11 [phone]

## 2011-06-18 NOTE — ED Notes (Signed)
Patient is resting comfortably. Nose continues to bleed pt given tissue to hold pressure.Nose bleed cart at the bedside.

## 2011-06-18 NOTE — ED Notes (Signed)
C/o nosebleed since 6am this morning.  Pt states she is taking Coumadin.

## 2011-06-18 NOTE — ED Notes (Signed)
Nasal tampon in r nare w/o s/s bleeding. No c/o's

## 2011-09-13 HISTORY — PX: DOPPLER ECHOCARDIOGRAPHY: SHX263

## 2012-01-10 ENCOUNTER — Ambulatory Visit
Admission: RE | Admit: 2012-01-10 | Discharge: 2012-01-10 | Disposition: A | Discharge status: Home / Self Care | Payer: Medicare Other | Source: Ambulatory Visit | Attending: Family Medicine | Admitting: Family Medicine

## 2012-01-10 ENCOUNTER — Other Ambulatory Visit: Payer: Self-pay | Admitting: Family Medicine

## 2012-01-10 DIAGNOSIS — R05 Cough: Secondary | ICD-10-CM

## 2012-01-10 DIAGNOSIS — R059 Cough, unspecified: Secondary | ICD-10-CM

## 2012-01-11 DIAGNOSIS — I517 Cardiomegaly: Secondary | ICD-10-CM | POA: Insufficient documentation

## 2012-01-26 ENCOUNTER — Other Ambulatory Visit: Payer: Self-pay | Admitting: Family Medicine

## 2012-01-26 DIAGNOSIS — Z1231 Encounter for screening mammogram for malignant neoplasm of breast: Secondary | ICD-10-CM

## 2012-01-26 DIAGNOSIS — Z780 Asymptomatic menopausal state: Secondary | ICD-10-CM

## 2012-02-12 ENCOUNTER — Ambulatory Visit
Admission: RE | Admit: 2012-02-12 | Discharge: 2012-02-12 | Disposition: A | Discharge status: Home / Self Care | Payer: Medicare Other | Source: Ambulatory Visit | Attending: Family Medicine | Admitting: Family Medicine

## 2012-02-12 DIAGNOSIS — Z1231 Encounter for screening mammogram for malignant neoplasm of breast: Secondary | ICD-10-CM

## 2012-02-12 DIAGNOSIS — Z780 Asymptomatic menopausal state: Secondary | ICD-10-CM

## 2012-02-13 DIAGNOSIS — M810 Age-related osteoporosis without current pathological fracture: Secondary | ICD-10-CM | POA: Insufficient documentation

## 2012-04-25 HISTORY — PX: OTHER SURGICAL HISTORY: SHX169

## 2012-06-18 DIAGNOSIS — K921 Melena: Secondary | ICD-10-CM | POA: Insufficient documentation

## 2012-10-03 ENCOUNTER — Ambulatory Visit: Payer: Self-pay | Admitting: Cardiology

## 2012-10-03 DIAGNOSIS — I639 Cerebral infarction, unspecified: Secondary | ICD-10-CM | POA: Insufficient documentation

## 2012-10-03 DIAGNOSIS — Z7901 Long term (current) use of anticoagulants: Secondary | ICD-10-CM | POA: Insufficient documentation

## 2012-10-03 DIAGNOSIS — Z952 Presence of prosthetic heart valve: Secondary | ICD-10-CM | POA: Insufficient documentation

## 2012-12-13 ENCOUNTER — Ambulatory Visit (INDEPENDENT_AMBULATORY_CARE_PROVIDER_SITE_OTHER): Payer: Medicare Other | Admitting: Pharmacist Clinician (PhC)/ Clinical Pharmacy Specialist

## 2012-12-13 VITALS — BP 130/80 | HR 76

## 2012-12-13 DIAGNOSIS — I639 Cerebral infarction, unspecified: Secondary | ICD-10-CM

## 2012-12-13 DIAGNOSIS — Z7901 Long term (current) use of anticoagulants: Secondary | ICD-10-CM

## 2012-12-13 DIAGNOSIS — Z952 Presence of prosthetic heart valve: Secondary | ICD-10-CM

## 2012-12-13 DIAGNOSIS — Z954 Presence of other heart-valve replacement: Secondary | ICD-10-CM

## 2012-12-13 DIAGNOSIS — I6350 Cerebral infarction due to unspecified occlusion or stenosis of unspecified cerebral artery: Secondary | ICD-10-CM

## 2012-12-13 LAB — POCT INR: INR: 3.6

## 2012-12-22 DIAGNOSIS — D3502 Benign neoplasm of left adrenal gland: Secondary | ICD-10-CM | POA: Insufficient documentation

## 2012-12-27 ENCOUNTER — Telehealth: Payer: Self-pay | Admitting: Cardiology

## 2012-12-27 NOTE — Telephone Encounter (Signed)
Message forwarded to B. Leron Croak, PA-C for further instructions.  Chart# [id number] on cart.

## 2012-12-27 NOTE — Telephone Encounter (Signed)
Pt Had to have blood today because hemoglobin was so low-Can they hold her Coumadin and Aspirin?Please let them know asap!

## 2012-12-27 NOTE — Telephone Encounter (Signed)
I spoke with from [place] at [facility] who asked if it is ok to hold coumadin and ASA due to GIB.  Patient's HGB was 6.1 and stool guaiac was positive.  She was transfused two units of PRBCs.  I recommended holding coumadin and continuing ASA as long as the patient's H&H is monitored closely.  She is scheduled to see GI next week. Chronic Afib and St. Jude mitral valve.

## 2012-12-31 ENCOUNTER — Telehealth: Payer: Self-pay | Admitting: Cardiology

## 2012-12-31 ENCOUNTER — Encounter: Payer: Self-pay | Admitting: Pharmacist Clinician (PhC)/ Clinical Pharmacy Specialist

## 2012-12-31 MED ORDER — ENOXAPARIN SODIUM 80 MG/0.8ML ~~LOC~~ SOLN: 80.0000 mg | Freq: Two times a day (BID) | SUBCUTANEOUS | Status: DC

## 2012-12-31 NOTE — Telephone Encounter (Signed)
LMOM for Tina Wade, we will lovenox bridge patient.  Spoke w/ patient, explained Lovenox bridge to her, will mail copy of info to her today as well.  Lovenox rx x 5 days sent to pharmacy.  Will not bridge prior to procedure as she is only off warfarin x 3 days prior.

## 2012-12-31 NOTE — Telephone Encounter (Signed)
I would defer this to [place].    She is on long-term anticoagulation for her atrial fibrillation and mechanical mitral valve. For her to endoscopy on Friday he she should aborted and stopping her warfarin.  Usually bridge for 2-3 days preprocedure with Lovenox. But Belenda Cruise is a protocol for this.

## 2012-12-31 NOTE — Telephone Encounter (Signed)
Message forwarded to Dr. Herbie Baltimore.  Paper chart# [id number]

## 2012-12-31 NOTE — Telephone Encounter (Signed)
Please advise on Lovenox bridge for Endo!Need this asap-having Endo on Friday!

## 2013-01-03 DIAGNOSIS — D500 Iron deficiency anemia secondary to blood loss (chronic): Secondary | ICD-10-CM | POA: Insufficient documentation

## 2013-01-09 ENCOUNTER — Ambulatory Visit (INDEPENDENT_AMBULATORY_CARE_PROVIDER_SITE_OTHER): Payer: Medicare Other | Admitting: Pharmacist Clinician (PhC)/ Clinical Pharmacy Specialist

## 2013-01-09 VITALS — BP 140/72 | HR 84

## 2013-01-09 DIAGNOSIS — Z7901 Long term (current) use of anticoagulants: Secondary | ICD-10-CM

## 2013-01-09 DIAGNOSIS — Z952 Presence of prosthetic heart valve: Secondary | ICD-10-CM

## 2013-01-09 DIAGNOSIS — Z954 Presence of other heart-valve replacement: Secondary | ICD-10-CM

## 2013-01-09 DIAGNOSIS — I639 Cerebral infarction, unspecified: Secondary | ICD-10-CM

## 2013-01-09 DIAGNOSIS — I6350 Cerebral infarction due to unspecified occlusion or stenosis of unspecified cerebral artery: Secondary | ICD-10-CM

## 2013-01-09 LAB — POCT INR: INR: 3.4

## 2013-01-23 ENCOUNTER — Ambulatory Visit: Payer: Medicare Other | Admitting: Pharmacist Clinician (PhC)/ Clinical Pharmacy Specialist

## 2013-02-11 ENCOUNTER — Ambulatory Visit (INDEPENDENT_AMBULATORY_CARE_PROVIDER_SITE_OTHER): Payer: Medicare Other | Admitting: Pharmacist Clinician (PhC)/ Clinical Pharmacy Specialist

## 2013-02-11 DIAGNOSIS — Z954 Presence of other heart-valve replacement: Secondary | ICD-10-CM

## 2013-02-11 DIAGNOSIS — I639 Cerebral infarction, unspecified: Secondary | ICD-10-CM

## 2013-02-11 DIAGNOSIS — I6350 Cerebral infarction due to unspecified occlusion or stenosis of unspecified cerebral artery: Secondary | ICD-10-CM

## 2013-02-11 DIAGNOSIS — Z952 Presence of prosthetic heart valve: Secondary | ICD-10-CM

## 2013-02-11 DIAGNOSIS — Z7901 Long term (current) use of anticoagulants: Secondary | ICD-10-CM

## 2013-02-11 LAB — POCT INR: INR: 5.4

## 2013-02-20 ENCOUNTER — Ambulatory Visit (INDEPENDENT_AMBULATORY_CARE_PROVIDER_SITE_OTHER): Payer: Medicare Other | Admitting: Pharmacist Clinician (PhC)/ Clinical Pharmacy Specialist

## 2013-02-20 DIAGNOSIS — Z7901 Long term (current) use of anticoagulants: Secondary | ICD-10-CM

## 2013-02-20 DIAGNOSIS — I6350 Cerebral infarction due to unspecified occlusion or stenosis of unspecified cerebral artery: Secondary | ICD-10-CM

## 2013-02-20 DIAGNOSIS — Z954 Presence of other heart-valve replacement: Secondary | ICD-10-CM

## 2013-02-20 DIAGNOSIS — Z952 Presence of prosthetic heart valve: Secondary | ICD-10-CM

## 2013-02-20 DIAGNOSIS — I639 Cerebral infarction, unspecified: Secondary | ICD-10-CM

## 2013-03-05 ENCOUNTER — Ambulatory Visit (INDEPENDENT_AMBULATORY_CARE_PROVIDER_SITE_OTHER): Payer: Medicare Other | Admitting: Pharmacist Clinician (PhC)/ Clinical Pharmacy Specialist

## 2013-03-05 DIAGNOSIS — Z7901 Long term (current) use of anticoagulants: Secondary | ICD-10-CM

## 2013-03-05 DIAGNOSIS — I639 Cerebral infarction, unspecified: Secondary | ICD-10-CM

## 2013-03-05 DIAGNOSIS — Z954 Presence of other heart-valve replacement: Secondary | ICD-10-CM

## 2013-03-05 DIAGNOSIS — I6350 Cerebral infarction due to unspecified occlusion or stenosis of unspecified cerebral artery: Secondary | ICD-10-CM

## 2013-03-05 DIAGNOSIS — Z952 Presence of prosthetic heart valve: Secondary | ICD-10-CM

## 2013-03-05 LAB — POCT INR: INR: 3.6

## 2013-03-06 ENCOUNTER — Ambulatory Visit: Payer: Medicare Other | Admitting: Pharmacist Clinician (PhC)/ Clinical Pharmacy Specialist

## 2013-04-02 ENCOUNTER — Ambulatory Visit (INDEPENDENT_AMBULATORY_CARE_PROVIDER_SITE_OTHER): Payer: Medicare Other | Admitting: Pharmacist Clinician (PhC)/ Clinical Pharmacy Specialist

## 2013-04-02 VITALS — BP 122/70 | HR 68

## 2013-04-02 DIAGNOSIS — I639 Cerebral infarction, unspecified: Secondary | ICD-10-CM

## 2013-04-02 DIAGNOSIS — Z954 Presence of other heart-valve replacement: Secondary | ICD-10-CM

## 2013-04-02 DIAGNOSIS — Z7901 Long term (current) use of anticoagulants: Secondary | ICD-10-CM

## 2013-04-02 DIAGNOSIS — I6350 Cerebral infarction due to unspecified occlusion or stenosis of unspecified cerebral artery: Secondary | ICD-10-CM

## 2013-04-02 DIAGNOSIS — Z952 Presence of prosthetic heart valve: Secondary | ICD-10-CM

## 2013-04-28 ENCOUNTER — Encounter: Payer: Self-pay | Admitting: *Deleted

## 2013-04-30 ENCOUNTER — Encounter: Payer: Self-pay | Admitting: Cardiology

## 2013-04-30 ENCOUNTER — Ambulatory Visit (INDEPENDENT_AMBULATORY_CARE_PROVIDER_SITE_OTHER): Payer: Medicare Other | Admitting: Pharmacist Clinician (PhC)/ Clinical Pharmacy Specialist

## 2013-04-30 ENCOUNTER — Ambulatory Visit: Payer: Medicare Other | Admitting: Pharmacist Clinician (PhC)/ Clinical Pharmacy Specialist

## 2013-04-30 ENCOUNTER — Ambulatory Visit (INDEPENDENT_AMBULATORY_CARE_PROVIDER_SITE_OTHER): Payer: Medicare Other | Admitting: Cardiology

## 2013-04-30 VITALS — BP 142/78 | HR 71 | Ht 65.0 in | Wt 177.0 lb

## 2013-04-30 DIAGNOSIS — F17210 Nicotine dependence, cigarettes, uncomplicated: Secondary | ICD-10-CM

## 2013-04-30 DIAGNOSIS — Z952 Presence of prosthetic heart valve: Secondary | ICD-10-CM

## 2013-04-30 DIAGNOSIS — I6350 Cerebral infarction due to unspecified occlusion or stenosis of unspecified cerebral artery: Secondary | ICD-10-CM

## 2013-04-30 DIAGNOSIS — R609 Edema, unspecified: Secondary | ICD-10-CM

## 2013-04-30 DIAGNOSIS — Z954 Presence of other heart-valve replacement: Secondary | ICD-10-CM

## 2013-04-30 DIAGNOSIS — I10 Essential (primary) hypertension: Secondary | ICD-10-CM | POA: Insufficient documentation

## 2013-04-30 DIAGNOSIS — I482 Chronic atrial fibrillation, unspecified: Secondary | ICD-10-CM

## 2013-04-30 DIAGNOSIS — I4891 Unspecified atrial fibrillation: Secondary | ICD-10-CM

## 2013-04-30 DIAGNOSIS — I639 Cerebral infarction, unspecified: Secondary | ICD-10-CM

## 2013-04-30 DIAGNOSIS — Z7901 Long term (current) use of anticoagulants: Secondary | ICD-10-CM

## 2013-04-30 DIAGNOSIS — F17200 Nicotine dependence, unspecified, uncomplicated: Secondary | ICD-10-CM

## 2013-04-30 DIAGNOSIS — R600 Localized edema: Secondary | ICD-10-CM

## 2013-04-30 MED ORDER — LOSARTAN POTASSIUM 25 MG PO TABS: 100.0000 mg | ORAL_TABLET | Freq: Every day | ORAL | Status: DC

## 2013-04-30 NOTE — Patient Instructions (Signed)
You seem to be doing well.    I do want to restart you on some of the Losartan - just not at the full dose. -- will start with 25 mg daily.  We do need to recheck your Echocardiogram in ~Feb 2015 for post Valve Replacement.  Otherwise, I will see you back in ~1 year.  Marykay Lex, MD  Your physician wants you to follow-up in: 12 months. You will receive a reminder letter in the mail two months in advance. If you don't receive a letter, please call our office to schedule the follow-up appointment.

## 2013-05-02 ENCOUNTER — Other Ambulatory Visit: Payer: Self-pay | Admitting: *Deleted

## 2013-05-02 DIAGNOSIS — R600 Localized edema: Secondary | ICD-10-CM | POA: Insufficient documentation

## 2013-05-02 DIAGNOSIS — F17210 Nicotine dependence, cigarettes, uncomplicated: Secondary | ICD-10-CM | POA: Insufficient documentation

## 2013-05-02 MED ORDER — LOSARTAN POTASSIUM 25 MG PO TABS: 100.0000 mg | ORAL_TABLET | Freq: Every day | ORAL | Status: DC

## 2013-05-02 NOTE — Assessment & Plan Note (Signed)
On warfarin, monitored her bike [employer], RPH-CPP; usually well controlled and therapeutic.

## 2013-05-02 NOTE — Assessment & Plan Note (Signed)
Continues to be rate controlled on no medications for rate control.  She is anticoagulated with warfarin with no significant bleeding. Relatively asymptomatic.

## 2013-05-02 NOTE — Assessment & Plan Note (Signed)
Her blood pressures is a little bit higher than I would like to be, and she is actually not on her ARB.  With her having some mild edema somewhat elevated filling pressures on her echocardiogram to the past, I would like her to have some macular reduction.  I will restart losartan, but 25 mg a day.

## 2013-05-02 NOTE — Progress Notes (Signed)
PATIENT: Tina Wade MRN: [id number]  DOB: 03/31/1929   DOV:05/02/2013 PCP: Willow Ora, MD  Clinic Note: Chief Complaint  Patient presents with  . Annual Exam    Pain in legs-due to arthritis, mild swelling at the end of the day.    HPI: Tina Wade is a 79 y.o. female with a PMH below who presents today for one-year followup. She is a very pleasant elderly woman with a history of valvular heart disease and atrial fibrillation.  She underwent mitral valve replacement with a St. Jude mechanical valve in 1996 (in [place], [place]) for mitral stenosis.  She also has chronic atrial fibrillation is rate controlled and anticoagulated with warfarin.  Her last echo was in February 2013 as noted below.  She actually declined a Myoview stress test in 2011.  Her last stress test was in 2005.  Interval History: Tina Wade has been doing very well for the last year.  She still notes having a little bit short of breath with going up and down stairs if she really pushes it.  But she is to be set to being somewhat out of shape and affect she still smokes about 4-6 cigarettes a day.  She is not yet willing to give it all up.  Besides the mild dyspnea on exertion, she has no symptoms of chest tightness or pressure with rest or exertion.  The disease only with significant exertion but not with routine exertion.  She denies any PND, orthopnea or edema.  She does have easy bruising but no significant bleeding symptoms of melena, hematochezia or hematuria/nosebleeds.  She does not note being in atrial fibrillation.  She doesn't feel palpitations or regular heartbeats.  She's not had any rapid episodes.  She does note that somewhere along the line she stopped taking losartan and is not sure what happened with that. Her lipids are being followed by her primary physician, last checked about a month ago.  The remainder of Cardiovascular ROS: no chest pain or dyspnea on exertion positive for - edema and  Mild at the end of the day, goes down with raising her feet negative for - irregular heartbeat, loss of consciousness, murmur, orthopnea, palpitations, paroxysmal nocturnal dyspnea, rapid heart rate or shortness of breath: Additional cardiac review of systems: Lightheadedness - no, dizziness - no, syncope/near-syncope - no; TIA/amaurosis fugax - no Melena - no, hematochezia no; hematuria - no; nosebleeds - no; claudication - no  Past Medical History  Diagnosis Date  . Hypertension   . High cholesterol   . Mitral valve stenosis 10/1994    s/p St. Jude mechanical valve placement -17M-101 MODEL  . Stroke 08/2010    from office visit 2013- the setting of normal INR of 2.8 ;therefore increased goal to 3.5  . Arrhythmia     chronic atrial fib. with controlled rate ,on warfarin therapy  . Thyroid disease     hypothyroidism on synthroid  . Lower extremity edema     mild -stable   Prior Cardiac Evaluation and Past Surgical History: Past Surgical History  Procedure Laterality Date  . Doppler echocardiography  09/13/2011    mild concentric LVH ,EF 45-50%,mild to moderate dilated right ventricle ,moderately dilated left atrium,prosthetic vlve noemal geadients and normal functioning bileaflet valve.The aortic valve had only trace regurg. and was mildly sclerotic and the septal wall motion with post-op state makes the echo EF difficult to assess  . Nm myocar perf wall motion  03/08/2004    done in [place]-----adenosine--normal  study,no ischemia  . Carotid doppler  04/25/2012    right ICA SMALL AMT FIBROUS PLAQUE;LEFT BULB SMALL TO MODERATE AMT OF MIXED DENSITY PLAQUE 50-69%; LEFT ICA 0-49%  . Mitral valve surgery  10/1994  . Transesophageal echocardiogram  08/2010    showed mild concentric LVH, EF 50-60% with moderate aortic regurg,calcified mitral annnulus with mechanical  prosthesis well functioning smoke inthe left atrial cavity noted;mod. to severe dilated. this when she was diagnosise with afib did  not undergo cardioversion   No Known Allergies  Current Outpatient Prescriptions  Medication Sig Dispense Refill  . aspirin 81 MG tablet Take 81 mg by mouth daily.        . calcium carbonate (OS-CAL) 600 MG TABS tablet Take 600 mg by mouth 2 (two) times daily with a meal.      . diclofenac sodium (VOLTAREN) 1 % GEL Apply topically. PRN      . ezetimibe-simvastatin (VYTORIN) 10-20 MG per tablet Take 1 tablet by mouth at bedtime.        . ferrous sulfate 325 (65 FE) MG tablet Take 325 mg by mouth every other day.      . furosemide (LASIX) 40 MG tablet Take 40 mg by mouth daily.       Marland Kitchen levothyroxine (SYNTHROID, LEVOTHROID) 112 MCG tablet Take 112 mcg by mouth daily.        . montelukast (SINGULAIR) 10 MG tablet Take 10 mg by mouth at bedtime.      . Multiple Vitamins-Minerals (MULTIVITAMINS THER. W/MINERALS) TABS Take 1 tablet by mouth daily.        Marland Kitchen warfarin (COUMADIN) 5 MG tablet Take 5 mg by mouth daily.       Marland Kitchen losartan (COZAAR) 25 MG tablet Take 4 tablets (100 mg total) by mouth daily.  360 tablet  3   No current facility-administered medications for this visit.    losartan reordered toda at lower dose y, was on 100 mg, but stopped for unknown reason  History   Social History Narrative  . No narrative on file   ROS: A comprehensive Review of Systems - Negative except Minimal symptoms noted above in history of present illness  PHYSICAL EXAM BP 142/78  Pulse 71  Ht 5\' 5"  (1.651 m)  Wt 177 lb (80.287 kg)  BMI 29.45 kg/m2 General appearance: alert, cooperative, appears stated age, no distress and borderline obese; well-nourished and well-groomed Neck: no adenopathy, no carotid bruit and no JVD Lungs: clear to auscultation bilaterally, normal percussion bilaterally and non-labored Heart: Irregularly irregular heart rate/rhythm but controlled rate, S1, S2 normal, no murmur, click, rub or gallop; nondisplaced PMI Abdomen: soft, non-tender; bowel sounds normal; no masses,  no  organomegaly; Extremities: extremities normal, atraumatic, no cyanosis, and trivial pedal edema  Pulses: 2+ and symmetric;  Neurologic: Mental status: Alert, oriented, thought content appropriate Cranial nerves: normal (II-XII grossly intact) HEENT: [place]/AT, EOMI, MMM, anicteric sclera  AOZ:[id number] today: Yes Rate: 71 , Rhythm: A. fib with PVCs/fusion beat  Recent Labs: Checked by PCP, but not available  ASSESSMENT / PLAN: H/O mitral valve replacement Her last echocardiogram was in February 2013, as was per plan Will continue to check every 2 years or so.  Will order for February 2015 to reevaluate her prosthetic valve, EF and filling pressures.  Chronic atrial fibrillation Continues to be rate controlled on no medications for rate control.  She is anticoagulated with warfarin with no significant bleeding. Relatively asymptomatic.  Current use of long term anticoagulation On  warfarin, monitored her bike [employer], RPH-CPP; usually well controlled and therapeutic.  Essential hypertension Her blood pressures is a little bit higher than I would like to be, and she is actually not on her ARB.  With her having some mild edema somewhat elevated filling pressures on her echocardiogram to the past, I would like her to have some macular reduction.  I will restart losartan, but 25 mg a day.  Bilateral lower extremity edema - mild Relatively well-controlled on furosemide.  She doesn't always take it daily.  Cigarette smoker one half pack a day or less unmotivated he quit -  I tried talking to her about cutting back her cigarettes.  I recommended she is only smoking 4-6 a day if she can cut that in half but, see her in a year.  She last and said "I'd just like to have a few cigarettes here and there." We will continue to address and followup visits, but I don't see that she can be very motivated to quit     Orders Placed This Encounter  Procedures  . EKG 12-Lead  . EKG 12-Lead     This order was created through External Result Entry  . 2D Echocardiogram without contrast    Standing Status: Future     Number of Occurrences:      Standing Expiration Date: 04/30/2014    Scheduling Instructions:     Schedule  for  Feb 2015    Order Specific Question:  Type of Echo    Answer:  Complete    Order Specific Question:  Where should this test be performed    Answer:  [facility]    Order Specific Question:  Reason for exam-Echo    Answer:  Mitral Valve Disorder  424.0   Meds ordered this encounter  Medications  . calcium carbonate (OS-CAL) 600 MG TABS tablet    Sig: Take 600 mg by mouth 2 (two) times daily with a meal.  . DISCONTD: losartan (COZAAR) 25 MG tablet    Sig: Take 4 tablets (100 mg total) by mouth daily.    Dispense:  90 tablet    Refill:  3    Followup: One year  DAVID W. Herbie Baltimore, M.D., M.S. [facility] [address] [place], [place]  [address]  [phone] Pager # [phone]

## 2013-05-02 NOTE — Telephone Encounter (Signed)
Rx was sent to pharmacy electronically. 

## 2013-05-02 NOTE — Assessment & Plan Note (Signed)
Her last echocardiogram was in February 2013, as was per plan Will continue to check every 2 years or so.  Will order for February 2015 to reevaluate her prosthetic valve, EF and filling pressures.

## 2013-05-02 NOTE — Assessment & Plan Note (Signed)
I tried talking to her about cutting back her cigarettes.  I recommended she is only smoking 4-6 a day if she can cut that in half but, see her in a year.  She last and said "I'd just like to have a few cigarettes here and there." We will continue to address and followup visits, but I don't see that she can be very motivated to quit

## 2013-05-02 NOTE — Assessment & Plan Note (Signed)
Relatively well-controlled on furosemide.  She doesn't always take it daily.

## 2013-05-21 ENCOUNTER — Ambulatory Visit (INDEPENDENT_AMBULATORY_CARE_PROVIDER_SITE_OTHER): Payer: Medicare Other | Admitting: Pharmacist Clinician (PhC)/ Clinical Pharmacy Specialist

## 2013-05-21 VITALS — BP 130/84 | HR 68

## 2013-05-21 DIAGNOSIS — I6350 Cerebral infarction due to unspecified occlusion or stenosis of unspecified cerebral artery: Secondary | ICD-10-CM

## 2013-05-21 DIAGNOSIS — Z952 Presence of prosthetic heart valve: Secondary | ICD-10-CM

## 2013-05-21 DIAGNOSIS — Z954 Presence of other heart-valve replacement: Secondary | ICD-10-CM

## 2013-05-21 DIAGNOSIS — Z7901 Long term (current) use of anticoagulants: Secondary | ICD-10-CM

## 2013-05-21 DIAGNOSIS — I639 Cerebral infarction, unspecified: Secondary | ICD-10-CM

## 2013-05-27 ENCOUNTER — Other Ambulatory Visit (HOSPITAL_COMMUNITY): Payer: Self-pay | Admitting: Cardiology

## 2013-05-27 ENCOUNTER — Telehealth (HOSPITAL_COMMUNITY): Payer: Self-pay | Admitting: *Deleted

## 2013-05-30 ENCOUNTER — Ambulatory Visit (HOSPITAL_COMMUNITY)
Admission: RE | Admit: 2013-05-30 | Discharge: 2013-05-30 | Disposition: A | Discharge status: Home / Self Care | Payer: Medicare Other | Source: Ambulatory Visit | Attending: Cardiovascular Disease | Admitting: Cardiovascular Disease

## 2013-05-30 DIAGNOSIS — I6529 Occlusion and stenosis of unspecified carotid artery: Secondary | ICD-10-CM

## 2013-05-30 NOTE — Progress Notes (Signed)
Carotid Duplex Completed. °Brianna L Mazza,RVT °

## 2013-06-18 ENCOUNTER — Ambulatory Visit (INDEPENDENT_AMBULATORY_CARE_PROVIDER_SITE_OTHER): Payer: Medicare Other | Admitting: Pharmacist Clinician (PhC)/ Clinical Pharmacy Specialist

## 2013-06-18 ENCOUNTER — Telehealth: Payer: Self-pay | Admitting: *Deleted

## 2013-06-18 ENCOUNTER — Other Ambulatory Visit (HOSPITAL_COMMUNITY): Payer: Self-pay | Admitting: Cardiology

## 2013-06-18 VITALS — BP 130/66 | HR 80

## 2013-06-18 DIAGNOSIS — I6350 Cerebral infarction due to unspecified occlusion or stenosis of unspecified cerebral artery: Secondary | ICD-10-CM

## 2013-06-18 DIAGNOSIS — I639 Cerebral infarction, unspecified: Secondary | ICD-10-CM

## 2013-06-18 DIAGNOSIS — Z952 Presence of prosthetic heart valve: Secondary | ICD-10-CM

## 2013-06-18 DIAGNOSIS — Z7901 Long term (current) use of anticoagulants: Secondary | ICD-10-CM

## 2013-06-18 DIAGNOSIS — Z954 Presence of other heart-valve replacement: Secondary | ICD-10-CM

## 2013-06-18 LAB — POCT INR: INR: 3.3

## 2013-06-18 NOTE — Telephone Encounter (Signed)
Spoke to patient.Carotid Result given . Verbalized understanding Recheck in 12 months(nov 2015)-Bilateral carotid Placed an ordered for next year

## 2013-06-18 NOTE — Telephone Encounter (Signed)
Message copied by Tobin Chad on Wed Jun 18, 2013  4:51 PM ------      Message from: Herbie Baltimore, DAVID W      Created: Fri Jun 06, 2013  5:21 PM       Stable moderate plaque in the Left Carotid.              Recheck 12 months.            Marykay Lex, MD       ------

## 2013-07-21 ENCOUNTER — Ambulatory Visit (INDEPENDENT_AMBULATORY_CARE_PROVIDER_SITE_OTHER): Payer: Medicare Other | Admitting: Pharmacist Clinician (PhC)/ Clinical Pharmacy Specialist

## 2013-07-21 VITALS — BP 128/70 | HR 88

## 2013-07-21 DIAGNOSIS — I639 Cerebral infarction, unspecified: Secondary | ICD-10-CM

## 2013-07-21 DIAGNOSIS — Z954 Presence of other heart-valve replacement: Secondary | ICD-10-CM

## 2013-07-21 DIAGNOSIS — Z7901 Long term (current) use of anticoagulants: Secondary | ICD-10-CM

## 2013-07-21 DIAGNOSIS — Z5181 Encounter for therapeutic drug level monitoring: Secondary | ICD-10-CM

## 2013-07-21 DIAGNOSIS — Z952 Presence of prosthetic heart valve: Secondary | ICD-10-CM

## 2013-07-21 DIAGNOSIS — I6350 Cerebral infarction due to unspecified occlusion or stenosis of unspecified cerebral artery: Secondary | ICD-10-CM

## 2013-07-21 LAB — POCT INR: INR: 2.4

## 2013-07-22 LAB — BASIC METABOLIC PANEL
BUN: 17 mg/dL (ref 6–23)
CALCIUM: 9.5 mg/dL (ref 8.4–10.5)
CO2: 32 mEq/L (ref 19–32)
Chloride: 99 mEq/L (ref 96–112)
Creat: 0.81 mg/dL (ref 0.50–1.10)
GLUCOSE: 93 mg/dL (ref 70–99)
Potassium: 4 mEq/L (ref 3.5–5.3)
SODIUM: 138 meq/L (ref 135–145)

## 2013-07-25 ENCOUNTER — Encounter: Payer: Self-pay | Admitting: Cardiology

## 2013-07-29 ENCOUNTER — Telehealth: Payer: Self-pay | Admitting: *Deleted

## 2013-07-29 NOTE — Telephone Encounter (Signed)
Spoke to patient. BMP  Result given . Verbalized understanding  

## 2013-07-29 NOTE — Telephone Encounter (Signed)
Message copied by Raiford Simmonds on Tue Jul 29, 2013  6:03 PM ------      Message from: Leonie Man      Created: Tue Jul 22, 2013 11:56 AM       Look great!Marland KitchenMarland Kitchen      Leonie Man, MD\ ------

## 2013-08-11 ENCOUNTER — Ambulatory Visit (INDEPENDENT_AMBULATORY_CARE_PROVIDER_SITE_OTHER): Payer: Medicare Other | Admitting: Pharmacist Clinician (PhC)/ Clinical Pharmacy Specialist

## 2013-08-11 VITALS — BP 112/56 | HR 84

## 2013-08-11 DIAGNOSIS — Z954 Presence of other heart-valve replacement: Secondary | ICD-10-CM

## 2013-08-11 DIAGNOSIS — I6350 Cerebral infarction due to unspecified occlusion or stenosis of unspecified cerebral artery: Secondary | ICD-10-CM

## 2013-08-11 DIAGNOSIS — I639 Cerebral infarction, unspecified: Secondary | ICD-10-CM

## 2013-08-11 DIAGNOSIS — Z952 Presence of prosthetic heart valve: Secondary | ICD-10-CM

## 2013-08-11 DIAGNOSIS — Z7901 Long term (current) use of anticoagulants: Secondary | ICD-10-CM

## 2013-08-11 LAB — POCT INR: INR: 3

## 2013-09-08 ENCOUNTER — Ambulatory Visit (INDEPENDENT_AMBULATORY_CARE_PROVIDER_SITE_OTHER): Payer: Medicare Other | Admitting: Pharmacist Clinician (PhC)/ Clinical Pharmacy Specialist

## 2013-09-08 VITALS — BP 132/68 | HR 80

## 2013-09-08 DIAGNOSIS — Z7901 Long term (current) use of anticoagulants: Secondary | ICD-10-CM

## 2013-09-08 DIAGNOSIS — Z952 Presence of prosthetic heart valve: Secondary | ICD-10-CM

## 2013-09-08 DIAGNOSIS — I6350 Cerebral infarction due to unspecified occlusion or stenosis of unspecified cerebral artery: Secondary | ICD-10-CM

## 2013-09-08 DIAGNOSIS — Z954 Presence of other heart-valve replacement: Secondary | ICD-10-CM

## 2013-09-08 DIAGNOSIS — I639 Cerebral infarction, unspecified: Secondary | ICD-10-CM

## 2013-09-08 LAB — POCT INR: INR: 2.7

## 2013-09-24 ENCOUNTER — Telehealth: Payer: Self-pay | Admitting: Pharmacist Clinician (PhC)/ Clinical Pharmacy Specialist

## 2013-09-24 NOTE — Telephone Encounter (Signed)
Pt went to PCP today, has active bleeding in throat, INR was 3.6 at their office.  Pt notices bleeding when lies flat, will spit up blood, otherwise is swallowing.  Advised her to hold warfarin Thursday (has already taken today), and if still actively bleeding on Friday to skip then as well.  Pt waiting to hear of appt with ENT to find cause.  Asked her to call back with date/MD that she is referred to .  Pt voiced understanding

## 2013-09-25 ENCOUNTER — Telehealth: Payer: Self-pay | Admitting: Pharmacist Clinician (PhC)/ Clinical Pharmacy Specialist

## 2013-09-25 NOTE — Telephone Encounter (Signed)
Pt concerned about missing multiple doses of warfarin , has appt with Dr. Leroy Sea (ENT in [place]) Friday at [facility] with patient, she thinks there is still some throat bleeding, but not sure.  I asked her to hold warfarin today and call after visit with Dr. Leroy Sea tomorrow.  We will know more at that time whether warfarin still needs to be held or ok to restart.  Pt voiced understanding, will call back Friday afternoon.

## 2013-10-06 ENCOUNTER — Other Ambulatory Visit (HOSPITAL_COMMUNITY): Payer: Self-pay | Admitting: Pharmacist Clinician (PhC)/ Clinical Pharmacy Specialist

## 2013-10-06 ENCOUNTER — Ambulatory Visit (INDEPENDENT_AMBULATORY_CARE_PROVIDER_SITE_OTHER): Payer: Medicare Other | Admitting: Pharmacist Clinician (PhC)/ Clinical Pharmacy Specialist

## 2013-10-06 VITALS — BP 122/70 | HR 68

## 2013-10-06 DIAGNOSIS — I6350 Cerebral infarction due to unspecified occlusion or stenosis of unspecified cerebral artery: Secondary | ICD-10-CM

## 2013-10-06 DIAGNOSIS — Z954 Presence of other heart-valve replacement: Secondary | ICD-10-CM

## 2013-10-06 DIAGNOSIS — Z952 Presence of prosthetic heart valve: Secondary | ICD-10-CM

## 2013-10-06 DIAGNOSIS — Z7901 Long term (current) use of anticoagulants: Secondary | ICD-10-CM

## 2013-10-06 DIAGNOSIS — I639 Cerebral infarction, unspecified: Secondary | ICD-10-CM

## 2013-10-06 LAB — POCT INR: INR: 2.7

## 2013-10-27 ENCOUNTER — Ambulatory Visit (INDEPENDENT_AMBULATORY_CARE_PROVIDER_SITE_OTHER): Payer: Medicare Other | Admitting: Pharmacist Clinician (PhC)/ Clinical Pharmacy Specialist

## 2013-10-27 DIAGNOSIS — Z7901 Long term (current) use of anticoagulants: Secondary | ICD-10-CM

## 2013-10-27 DIAGNOSIS — Z954 Presence of other heart-valve replacement: Secondary | ICD-10-CM

## 2013-10-27 DIAGNOSIS — Z952 Presence of prosthetic heart valve: Secondary | ICD-10-CM

## 2013-10-27 DIAGNOSIS — I639 Cerebral infarction, unspecified: Secondary | ICD-10-CM

## 2013-10-27 DIAGNOSIS — I6350 Cerebral infarction due to unspecified occlusion or stenosis of unspecified cerebral artery: Secondary | ICD-10-CM

## 2013-10-27 LAB — POCT INR
INR: 3.2
INR: 3.2

## 2013-11-24 ENCOUNTER — Ambulatory Visit (INDEPENDENT_AMBULATORY_CARE_PROVIDER_SITE_OTHER): Payer: Medicare Other | Admitting: Pharmacist Clinician (PhC)/ Clinical Pharmacy Specialist

## 2013-11-24 VITALS — BP 150/78 | HR 84

## 2013-11-24 DIAGNOSIS — I6350 Cerebral infarction due to unspecified occlusion or stenosis of unspecified cerebral artery: Secondary | ICD-10-CM

## 2013-11-24 DIAGNOSIS — Z7901 Long term (current) use of anticoagulants: Secondary | ICD-10-CM

## 2013-11-24 DIAGNOSIS — I639 Cerebral infarction, unspecified: Secondary | ICD-10-CM

## 2013-11-24 DIAGNOSIS — Z954 Presence of other heart-valve replacement: Secondary | ICD-10-CM

## 2013-11-24 DIAGNOSIS — Z952 Presence of prosthetic heart valve: Secondary | ICD-10-CM

## 2013-11-24 LAB — POCT INR: INR: 3.3

## 2013-12-05 ENCOUNTER — Ambulatory Visit: Payer: Self-pay | Admitting: Pharmacist Clinician (PhC)/ Clinical Pharmacy Specialist

## 2013-12-05 DIAGNOSIS — I639 Cerebral infarction, unspecified: Secondary | ICD-10-CM

## 2013-12-05 DIAGNOSIS — Z7901 Long term (current) use of anticoagulants: Secondary | ICD-10-CM

## 2013-12-05 DIAGNOSIS — Z952 Presence of prosthetic heart valve: Secondary | ICD-10-CM

## 2013-12-22 ENCOUNTER — Ambulatory Visit: Payer: Medicare Other | Admitting: Pharmacist Clinician (PhC)/ Clinical Pharmacy Specialist

## 2014-03-22 ENCOUNTER — Other Ambulatory Visit: Payer: Self-pay | Admitting: Cardiology

## 2014-03-26 DIAGNOSIS — Z8673 Personal history of transient ischemic attack (TIA), and cerebral infarction without residual deficits: Secondary | ICD-10-CM | POA: Insufficient documentation

## 2014-03-27 NOTE — Telephone Encounter (Signed)
Rx refill sent to patient pharmacy   

## 2014-05-13 ENCOUNTER — Telehealth (HOSPITAL_COMMUNITY): Payer: Self-pay | Admitting: *Deleted

## 2014-06-30 ENCOUNTER — Telehealth (HOSPITAL_COMMUNITY): Payer: Self-pay | Admitting: *Deleted

## 2014-07-21 ENCOUNTER — Other Ambulatory Visit: Payer: Self-pay | Admitting: Family Medicine

## 2014-07-21 DIAGNOSIS — M8580 Other specified disorders of bone density and structure, unspecified site: Secondary | ICD-10-CM

## 2014-08-05 ENCOUNTER — Ambulatory Visit
Admission: RE | Admit: 2014-08-05 | Discharge: 2014-08-05 | Disposition: A | Discharge status: Home / Self Care | Payer: Medicare Other | Source: Ambulatory Visit | Attending: Family Medicine | Admitting: Family Medicine

## 2014-08-05 DIAGNOSIS — M8580 Other specified disorders of bone density and structure, unspecified site: Secondary | ICD-10-CM

## 2015-09-12 DIAGNOSIS — E876 Hypokalemia: Secondary | ICD-10-CM | POA: Insufficient documentation

## 2015-09-16 DIAGNOSIS — I609 Nontraumatic subarachnoid hemorrhage, unspecified: Secondary | ICD-10-CM | POA: Insufficient documentation

## 2016-07-13 ENCOUNTER — Other Ambulatory Visit: Payer: Self-pay | Admitting: Family Medicine

## 2016-07-13 DIAGNOSIS — R1319 Other dysphagia: Secondary | ICD-10-CM

## 2016-07-13 DIAGNOSIS — R1310 Dysphagia, unspecified: Secondary | ICD-10-CM

## 2016-07-24 ENCOUNTER — Ambulatory Visit
Admission: RE | Admit: 2016-07-24 | Discharge: 2016-07-24 | Disposition: A | Discharge status: Home / Self Care | Payer: Medicare Other | Source: Ambulatory Visit | Attending: Family Medicine | Admitting: Family Medicine

## 2016-07-24 ENCOUNTER — Other Ambulatory Visit: Payer: Medicare Other

## 2016-07-24 DIAGNOSIS — R1310 Dysphagia, unspecified: Secondary | ICD-10-CM

## 2016-07-24 DIAGNOSIS — R1319 Other dysphagia: Secondary | ICD-10-CM

## 2016-09-20 DIAGNOSIS — R1310 Dysphagia, unspecified: Secondary | ICD-10-CM | POA: Insufficient documentation

## 2016-11-07 DIAGNOSIS — J449 Chronic obstructive pulmonary disease, unspecified: Secondary | ICD-10-CM | POA: Insufficient documentation

## 2016-12-04 ENCOUNTER — Encounter: Payer: Self-pay | Admitting: Physical Therapy

## 2016-12-04 ENCOUNTER — Ambulatory Visit: Payer: Medicare Other | Attending: Family Medicine | Admitting: Physical Therapy

## 2016-12-04 DIAGNOSIS — R2689 Other abnormalities of gait and mobility: Secondary | ICD-10-CM

## 2016-12-04 DIAGNOSIS — M6281 Muscle weakness (generalized): Secondary | ICD-10-CM | POA: Insufficient documentation

## 2016-12-04 NOTE — Patient Instructions (Signed)

## 2016-12-04 NOTE — Therapy (Signed)
[facility] [address] [place], [place], [address] Phone: [phone]   Fax:  [phone]  Physical Therapy Evaluation  Patient Details  Name: Tina Wade MRN: [id number] Date of Birth: [birth year] Referring Provider: Dr. Dierdre Forth  Encounter Date: 12/04/2016      PT End of Session - 12/04/16 1232    Visit Number 1   Number of Visits 10   Date for PT Re-Evaluation 01/29/17   Authorization Type medicare g-code on 10th; Kx modifier on 15th   PT Start Time 1145   PT Stop Time 1225   PT Time Calculation (min) 40 min   Activity Tolerance Patient tolerated treatment well   Behavior During Therapy [facility] for tasks assessed/performed      Past Medical History:  Diagnosis Date  . Arrhythmia    chronic atrial fib. with controlled rate ,on warfarin therapy  . High cholesterol   . Hypertension   . Lower extremity edema    mild -stable  . Mitral valve stenosis 10/1994   s/p St. Jude mechanical valve placement -27M-101 MODEL  . Stroke ([place]) 08/2010   from office visit 2013- the setting of normal INR of 2.8 ;therefore increased goal to 3.5  . Thyroid disease    hypothyroidism on synthroid    Past Surgical History:  Procedure Laterality Date  . CAROTID DOPPLER  04/25/2012   right ICA SMALL AMT FIBROUS PLAQUE;LEFT BULB SMALL TO MODERATE AMT OF MIXED DENSITY PLAQUE 50-69%; LEFT ICA 0-49%  . DOPPLER ECHOCARDIOGRAPHY  09/13/2011   mild concentric LVH ,EF 45-50%,mild to moderate dilated right ventricle ,moderately dilated left atrium,prosthetic vlve noemal geadients and normal functioning bileaflet valve.The aortic valve had only trace regurg. and was mildly sclerotic and the septal wall motion with post-op state makes the echo EF difficult to assess  . MITRAL VALVE SURGERY  10/1994  . NM MYOCAR PERF WALL MOTION  03/08/2004   done in [place]-----adenosine--normal study,no ischemia  . TRANSESOPHAGEAL ECHOCARDIOGRAM   08/2010   showed mild concentric LVH, EF 50-60% with moderate aortic regurg,calcified mitral annnulus with mechanical  prosthesis well functioning smoke inthe left atrial cavity noted;mod. to severe dilated. this when she was diagnosise with afib did not undergo cardioversion    There were no vitals filed for this visit.       Subjective Assessment - 12/04/16 1157    Subjective Patient had a fall last year and middle of May.  Patient does not know why she fell and had her cane. Patient stepped off a curb with cane and fell.    Patient Stated Goals Improve balance   Currently in Pain? No/denies            [facility] PT Assessment - 12/04/16 0001      Assessment   Medical Diagnosis R26.89 Balance Problems   Referring Provider Dr. Dierdre Forth   Onset Date/Surgical Date 11/26/16   Prior Therapy balance in the past     Precautions   Precautions Fall   Precaution Comments fall risk, osteopenia     Restrictions   Weight Bearing Restrictions No     Balance Screen   Has the patient fallen in the past 6 months Yes   How many times? 1   Has the patient had a decrease in activity level because of a fear of falling?  No   Is the patient reluctant to leave their home because of a fear of falling?  No  fear of falling and  careful     [place] residence   Living Arrangements Children   Type of [place] Access Level entry   Home Layout One level     Prior Function   Level of [place] Retired   Leisure sedentary hobies     Cognition   Overall Cognitive Status Within Functional Limits for tasks assessed     Observation/Other Assessments   Focus on Therapeutic Outcomes (FOTO)  Berg balance score is 41/56 80% chance of falls  goal is 52/56 so 25% chance of falls     ROM / Strength   AROM / PROM / Strength Strength;AROM     Strength   Right Hip Flexion 4/5   Right Hip Extension 3+/5   Right Hip ABduction  3-/5   Left Hip Flexion 4/5   Left Hip Extension 3+/5   Left Hip ABduction 3/5   Right Knee Flexion 4+/5   Right Knee Extension 4+/5   Left Knee Flexion 4+/5   Left Knee Extension 4+/5     Transfers   Transfers Not assessed     Ambulation/Gait   Ambulation/Gait Yes   Assistive device Straight cane   Gait Pattern Decreased step length - right;Decreased step length - left   Ambulation Surface Level     Standardized Balance Assessment   Standardized Balance Assessment Timed Up and Go Test;Berg Balance Test     Berg Balance Test   Sit to Stand Able to stand  independently using hands   Standing Unsupported Able to stand safely 2 minutes   Sitting with Back Unsupported but Feet Supported on Floor or Stool Able to sit safely and securely 2 minutes   Stand to Sit Controls descent by using hands   Transfers Able to transfer safely, definite need of hands   Standing Unsupported with Eyes Closed Able to stand 10 seconds safely   Standing Ubsupported with Feet Together Able to place feet together independently and stand 1 minute safely   From Standing, Reach Forward with Outstretched Arm Can reach forward >12 cm safely (5")   From Standing Position, Pick up Object from Floor Able to pick up shoe safely and easily   From Standing Position, Turn to Look Behind Over each Shoulder Looks behind from both sides and weight shifts well   Turn 360 Degrees Able to turn 360 degrees safely but slowly  7 sec   Standing Unsupported, Alternately Place Feet on Step/Stool Needs assistance to keep from falling or unable to try   Standing Unsupported, One Foot in Front Able to take small step independently and hold 30 seconds   Standing on One Leg Tries to lift leg/unable to hold 3 seconds but remains standing independently   Total Score 41   Berg comment: 80% chance of falling  goal is 52/56 (25% chance of falls)     Timed Up and Go Test   TUG Normal TUG   Normal TUG (seconds) 18  no assistive device    TUG Comments risk of fall                           PT Education - 12/04/16 1233    Education provided Yes   Education Details tips to avoid falls   Person(s) Educated Patient   Methods Explanation;Handout   Comprehension Verbalized understanding          PT Short Term  Goals - 12/04/16 1243      PT SHORT TERM GOAL #1   Title independent with initial HEP for balance   Time 4   Period Weeks   Status New     PT SHORT TERM GOAL #2   Title understand tips to avoid falls   Time 4   Period Weeks   Status New     PT SHORT TERM GOAL #3   Title Berg balance score is >/= 45/56 due to improved strength   Time 4   Period Weeks   Status New     PT SHORT TERM GOAL #4   Title TUG score is </= 15 sec with no assistive device  due to improve fear of falls    Time 4   Period Weeks   Status New           PT Long Term Goals - 12/04/16 1245      PT LONG TERM GOAL #1   Title independent with HEP and understands how to progress herself   Time 8   Period Weeks   Status New     PT LONG TERM GOAL #2   Title walking 15 min. per day in a safe place with no hills using her cane to build her endurance   Time 8   Period Weeks   Status New     PT LONG TERM GOAL #3   Title Berg balance score is >/= 52/56 due to increased bilateral lower strength >/= 4/5    Time 8   Period Weeks   Status New     PT LONG TERM GOAL #4   Title TUG score is </= 13 seconds with no assistive device due to reduction in fear of falls   Time 8   Period Weeks   Status New     PT LONG TERM GOAL #5   Title ability to go up and down a curb and step with single point cane safely due to increased strength   Time 8   Period Weeks   Status New               Plan - 12/04/16 1235    Clinical Impression Statement Patient is a 83 year old female with fall on 11/26/2016 while stepping down a curb and using a cane.  Patient reports she has a fear of falls.  Patient reports no pain.   Berg balance score is 41/56 with difficulty performing single leg stance of tasks that involved with lifting one leg and keeping balance.  TUG score was 18 seconds without a cane but she was nervous to fall. Bilateral knee strength was 4+/5.  Bilateral hip abduction was 3-/5, hip extension 3+/5 and flexion was 4/5.  Patient is moderately complex evaluation due to an evolving condition and comorbidities such as history of TIA, on coumadin, COPD, and CVA that could impact care provided.  Patient will benefit from skilled therapy to improve balance by increasing strength and edurance.    Rehab Potential Excellent   Clinical Impairments Affecting Rehab Potential history of TIA; COPD; on coumadin; history of CVA   PT Frequency 2x / week   PT Duration 8 weeks   PT Treatment/Interventions Patient/family education;Stair training;Gait training;Therapeutic activities;Therapeutic exercise;Balance training;Neuromuscular re-education   PT Next Visit Plan hip strength; bil. knee strength; balance; nustep   PT Home Exercise Plan progress as needed   Recommended Other Services None   Consulted and Agree with Plan of Care Patient  Patient will benefit from skilled therapeutic intervention in order to improve the following deficits and impairments:  Abnormal gait, Difficulty walking, Decreased strength, Decreased endurance  Visit Diagnosis: Muscle weakness (generalized)  Other abnormalities of gait and mobility      G-Codes - 2017/01/03 1226    Functional Assessment Tool Used (Outpatient Only) Berg balance score is 41/56 indicating 80% chance of falls   Functional Limitation Mobility: Walking and moving around   Mobility: Walking and Moving Around Current Status [phone]) At least 80 percent but less than 100 percent impaired, limited or restricted   Mobility: Walking and Moving Around Goal Status [phone]) At least 20 percent but less than 40 percent impaired, limited or restricted       Problem  List Patient Active Problem List   Diagnosis Date Noted  . Bilateral lower extremity edema - mild 05/02/2013  . Cigarette smoker one half pack a day or less unmotivated he quit -  05/02/2013  . Chronic atrial fibrillation ([place]) 04/30/2013  . Essential hypertension 04/30/2013  . Long term (current) use of anticoagulants 10/03/2012  . CVA (cerebral vascular accident) ([place]) 10/03/2012  . H/O mitral valve replacement 10/03/2012    Earlie Counts, PT 01-03-2017 12:50 PM    [facility] [address], [place], [place], [address] Phone: [phone]   Fax:  [phone]  Name: Tina Wade MRN: [id number] Date of Birth: 04-May-1929

## 2016-12-05 ENCOUNTER — Ambulatory Visit: Payer: Medicare Other | Admitting: Physical Therapy

## 2016-12-05 ENCOUNTER — Encounter: Payer: Self-pay | Admitting: Physical Therapy

## 2016-12-05 DIAGNOSIS — R2689 Other abnormalities of gait and mobility: Secondary | ICD-10-CM

## 2016-12-05 DIAGNOSIS — M6281 Muscle weakness (generalized): Secondary | ICD-10-CM

## 2016-12-05 NOTE — Therapy (Signed)
[facility] [address], [place], [place], [address] Phone: [phone]   Fax:  [phone]  Physical Therapy Treatment  Patient Details  Name: Tina Wade MRN: [id number] Date of Birth: 12-30-1928 Referring Provider: Dr. Dierdre Forth  Encounter Date: 12/05/2016      PT End of Session - 12/05/16 1441    Visit Number 2   Number of Visits 10   Date for PT Re-Evaluation 01/29/17   Authorization Type medicare g-code on 10th; Kx modifier on 15th   PT Start Time 1400   PT Stop Time 1440   PT Time Calculation (min) 40 min   Activity Tolerance Patient tolerated treatment well   Behavior During Therapy [facility] for tasks assessed/performed      Past Medical History:  Diagnosis Date  . Arrhythmia    chronic atrial fib. with controlled rate ,on warfarin therapy  . High cholesterol   . Hypertension   . Lower extremity edema    mild -stable  . Mitral valve stenosis 10/1994   s/p St. Jude mechanical valve placement -67M-101 MODEL  . Stroke ([place]) 08/2010   from office visit 2013- the setting of normal INR of 2.8 ;therefore increased goal to 3.5  . Thyroid disease    hypothyroidism on synthroid    Past Surgical History:  Procedure Laterality Date  . CAROTID DOPPLER  04/25/2012   right ICA SMALL AMT FIBROUS PLAQUE;LEFT BULB SMALL TO MODERATE AMT OF MIXED DENSITY PLAQUE 50-69%; LEFT ICA 0-49%  . DOPPLER ECHOCARDIOGRAPHY  09/13/2011   mild concentric LVH ,EF 45-50%,mild to moderate dilated right ventricle ,moderately dilated left atrium,prosthetic vlve noemal geadients and normal functioning bileaflet valve.The aortic valve had only trace regurg. and was mildly sclerotic and the septal wall motion with post-op state makes the echo EF difficult to assess  . MITRAL VALVE SURGERY  10/1994  . NM MYOCAR PERF WALL MOTION  03/08/2004   done in [place]-----adenosine--normal study,no ischemia  . TRANSESOPHAGEAL ECHOCARDIOGRAM  08/2010    showed mild concentric LVH, EF 50-60% with moderate aortic regurg,calcified mitral annnulus with mechanical  prosthesis well functioning smoke inthe left atrial cavity noted;mod. to severe dilated. this when she was diagnosise with afib did not undergo cardioversion    There were no vitals filed for this visit.      Subjective Assessment - 12/05/16 1408    Subjective I am worried about my risk to fall.  My hips were achy after the last visit.    Patient Stated Goals Improve balance   Currently in Pain? No/denies   Multiple Pain Sites No                         [facility] Adult PT Treatment/Exercise - 12/05/16 0001      Knee/Hip Exercises: Aerobic   Nustep level 1x 5 min ;seat #7, arms #11     Knee/Hip Exercises: Standing   Other Standing Knee Exercises stairs placing alternate feet with 2 hands on railings 10x vc to contract abdominals;      Knee/Hip Exercises: Seated   Long Arc Quad Strengthening;Right;Left;2 sets;15 reps;Weights   Long Arc Quad Weight 3 lbs.   Sit to Sand 1 set;15 reps;with UE support  redband around knees to increase hip ER                PT Education - 12/05/16 1426    Education provided Yes   Education Details hip and ankle exercises  in standing at [employer]) Educated Patient   Methods Explanation;Demonstration;Verbal cues;Handout   Comprehension Returned demonstration;Verbalized understanding          PT Short Term Goals - 12-08-16 1243      PT SHORT TERM GOAL #1   Title independent with initial HEP for balance   Time 4   Period Weeks   Status New     PT SHORT TERM GOAL #2   Title understand tips to avoid falls   Time 4   Period Weeks   Status New     PT SHORT TERM GOAL #3   Title Berg balance score is >/= 45/56 due to improved strength   Time 4   Period Weeks   Status New     PT SHORT TERM GOAL #4   Title TUG score is </= 15 sec with no assistive device  due to improve fear of falls    Time 4    Period Weeks   Status New           PT Long Term Goals - 12-08-16 1245      PT LONG TERM GOAL #1   Title independent with HEP and understands how to progress herself   Time 8   Period Weeks   Status New     PT LONG TERM GOAL #2   Title walking 15 min. per day in a safe place with no hills using her cane to build her endurance   Time 8   Period Weeks   Status New     PT LONG TERM GOAL #3   Title Berg balance score is >/= 52/56 due to increased bilateral lower strength >/= 4/5    Time 8   Period Weeks   Status New     PT LONG TERM GOAL #4   Title TUG score is </= 13 seconds with no assistive device due to reduction in fear of falls   Time 8   Period Weeks   Status New     PT LONG TERM GOAL #5   Title ability to go up and down a curb and step with single point cane safely due to increased strength   Time 8   Period Weeks   Status New               Plan - 12/05/16 1441    Clinical Impression Statement Patient was able to do exercises without pain.  Patient needed verbal cues to contract her abdominals with exercise to improve core strength.  Patient has to use her hands to go from sit to stand due to LE strength.  Patient will benefit from skilled therapy to improve balance by increasing strength and edurance.    Rehab Potential Excellent   Clinical Impairments Affecting Rehab Potential history of TIA; COPD; on coumadin; history of CVA   PT Frequency 2x / week   PT Duration 8 weeks   PT Treatment/Interventions Patient/family education;Stair training;Gait training;Therapeutic activities;Therapeutic exercise;Balance training;Neuromuscular re-education   PT Next Visit Plan ; balance; nustep   PT Home Exercise Plan progress as needed   Consulted and Agree with Plan of Care Patient      Patient will benefit from skilled therapeutic intervention in order to improve the following deficits and impairments:  Abnormal gait, Difficulty walking, Decreased strength,  Decreased endurance  Visit Diagnosis: Muscle weakness (generalized)  Other abnormalities of gait and mobility       G-Codes - 2016/12/08 1226  Functional Assessment Tool Used (Outpatient Only) Berg balance score is 41/56 indicating 80% chance of falls   Functional Limitation Mobility: Walking and moving around   Mobility: Walking and Moving Around Current Status [phone]) At least 80 percent but less than 100 percent impaired, limited or restricted   Mobility: Walking and Moving Around Goal Status [phone]) At least 20 percent but less than 40 percent impaired, limited or restricted      Problem List Patient Active Problem List   Diagnosis Date Noted  . Bilateral lower extremity edema - mild 05/02/2013  . Cigarette smoker one half pack a day or less unmotivated he quit -  05/02/2013  . Chronic atrial fibrillation ([place]) 04/30/2013  . Essential hypertension 04/30/2013  . Long term (current) use of anticoagulants 10/03/2012  . CVA (cerebral vascular accident) ([place]) 10/03/2012  . H/O mitral valve replacement 10/03/2012    Earlie Counts, PT 12/05/16 2:44 PM    [facility] [address], [place], [place], [address] Phone: [phone]   Fax:  [phone]  Name: KEELEE YANKEY MRN: [id number] Date of Birth: Dec 01, 1928

## 2016-12-05 NOTE — Patient Instructions (Addendum)
ABDUCTION: Standing (Active)    Stand, feet flat. Lift right leg out to side.  Complete _2__ sets of _10__ repetitions. Perform __1_ sessions per day. Then do on left leg.  Do not let trunk move. http://gtsc.exer.us/110   Copyright  VHI. All rights reserved.  EXTENSION: Standing (Active)    Stand, both feet flat. Draw right leg behind body as far as possible. Complete _2__ sets of _10__ repetitions. Perform __1_ sessions per day. Then do on left leg. Do not let the trunk move.  http://gtsc.exer.us/76   Copyright  VHI. All rights reserved.  With Support    Stand on one leg in neutral spine holding support. Hold __5__ seconds. Repeat on other leg. Do _2___ repetitions, __1__ sets.  http://bt.exer.us/34   Copyright  VHI. All rights reserved.   Heel Raise: Bilateral (Standing)    Rise on balls of feet.  Hold onto counter Repeat _15___ times per set. Do _1___ sets per session. Do _1___ sessions per day.  http://orth.exer.us/39   Copyright  VHI. All rights reserved.  [place] [address], [place], [place] [address] Phone # [phone] Fax [phone]

## 2016-12-12 ENCOUNTER — Encounter: Payer: No Typology Code available for payment source | Admitting: Physical Therapy

## 2016-12-14 ENCOUNTER — Ambulatory Visit: Payer: Medicare Other

## 2016-12-14 DIAGNOSIS — R2689 Other abnormalities of gait and mobility: Secondary | ICD-10-CM

## 2016-12-14 DIAGNOSIS — M6281 Muscle weakness (generalized): Secondary | ICD-10-CM | POA: Diagnosis not present

## 2016-12-14 NOTE — Therapy (Signed)
[facility] [address] [place], [place], [address] Phone: [phone]   Fax:  [phone]  Physical Therapy Treatment  Patient Details  Name: Tina Wade MRN: [id number] Date of Birth: February 05, 1929 Referring Provider: Dr. Dierdre Forth  Encounter Date: 12/14/2016      PT End of Session - 12/14/16 1303    Visit Number 3   Number of Visits 10   Date for PT Re-Evaluation 01/29/17   Authorization Type medicare g-code on 10th; Kx modifier on 15th   PT Start Time 1224   PT Stop Time 1302   PT Time Calculation (min) 38 min   Activity Tolerance Patient tolerated treatment well;Patient limited by fatigue   Behavior During Therapy [facility] for tasks assessed/performed      Past Medical History:  Diagnosis Date  . Arrhythmia    chronic atrial fib. with controlled rate ,on warfarin therapy  . High cholesterol   . Hypertension   . Lower extremity edema    mild -stable  . Mitral valve stenosis 10/1994   s/p St. Jude mechanical valve placement -65M-101 MODEL  . Stroke ([place]) 08/2010   from office visit 2013- the setting of normal INR of 2.8 ;therefore increased goal to 3.5  . Thyroid disease    hypothyroidism on synthroid    Past Surgical History:  Procedure Laterality Date  . CAROTID DOPPLER  04/25/2012   right ICA SMALL AMT FIBROUS PLAQUE;LEFT BULB SMALL TO MODERATE AMT OF MIXED DENSITY PLAQUE 50-69%; LEFT ICA 0-49%  . DOPPLER ECHOCARDIOGRAPHY  09/13/2011   mild concentric LVH ,EF 45-50%,mild to moderate dilated right ventricle ,moderately dilated left atrium,prosthetic vlve noemal geadients and normal functioning bileaflet valve.The aortic valve had only trace regurg. and was mildly sclerotic and the septal wall motion with post-op state makes the echo EF difficult to assess  . MITRAL VALVE SURGERY  10/1994  . NM MYOCAR PERF WALL MOTION  03/08/2004   done in [place]-----adenosine--normal study,no ischemia  .  TRANSESOPHAGEAL ECHOCARDIOGRAM  08/2010   showed mild concentric LVH, EF 50-60% with moderate aortic regurg,calcified mitral annnulus with mechanical  prosthesis well functioning smoke inthe left atrial cavity noted;mod. to severe dilated. this when she was diagnosise with afib did not undergo cardioversion    There were no vitals filed for this visit.      Subjective Assessment - 12/14/16 1229    Subjective I haven't done my exercises as much because I had a nose bleed that lasted 2 days (intermittent)   Patient Stated Goals Improve balance   Currently in Pain? No/denies                         [facility] Adult PT Treatment/Exercise - 12/14/16 0001      Knee/Hip Exercises: Aerobic   Nustep level 1x 8 min ;seat #7, arms #11     Knee/Hip Exercises: Standing   Heel Raises Both;2 sets;10 reps   Hip Abduction Stengthening;Both;2 sets;10 reps   Abduction Limitations 3#   Hip Extension Stengthening;Both;2 sets;10 reps   Extension Limitations 3#   Rocker Board 3 minutes   Rebounder weight shifting 3 ways x1 min each   Other Standing Knee Exercises stairs placing alternate feet with 2 hands on railings 10x vc to contract abdominals;      Knee/Hip Exercises: Seated   Long Arc Quad Strengthening;Right;Left;2 sets;15 reps;Weights   Long Arc Quad Weight 3 lbs.   Marching Limitations 2x10   Marching  Weights 3 lbs.   Sit to Sand 1 set;15 reps;with UE support                  PT Short Term Goals - 12/04/16 1243      PT SHORT TERM GOAL #1   Title independent with initial HEP for balance   Time 4   Period Weeks   Status New     PT SHORT TERM GOAL #2   Title understand tips to avoid falls   Time 4   Period Weeks   Status New     PT SHORT TERM GOAL #3   Title Berg balance score is >/= 45/56 due to improved strength   Time 4   Period Weeks   Status New     PT SHORT TERM GOAL #4   Title TUG score is </= 15 sec with no assistive device  due to improve fear of  falls    Time 4   Period Weeks   Status New           PT Long Term Goals - 12/04/16 1245      PT LONG TERM GOAL #1   Title independent with HEP and understands how to progress herself   Time 8   Period Weeks   Status New     PT LONG TERM GOAL #2   Title walking 15 min. per day in a safe place with no hills using her cane to build her endurance   Time 8   Period Weeks   Status New     PT LONG TERM GOAL #3   Title Berg balance score is >/= 52/56 due to increased bilateral lower strength >/= 4/5    Time 8   Period Weeks   Status New     PT LONG TERM GOAL #4   Title TUG score is </= 13 seconds with no assistive device due to reduction in fear of falls   Time 8   Period Weeks   Status New     PT LONG TERM GOAL #5   Title ability to go up and down a curb and step with single point cane safely due to increased strength   Time 8   Period Weeks   Status New               Plan - 12/14/16 1245    Clinical Impression Statement Pt is independent and compliant with standing strength/balance exercises. Pt able to tolerate addition of ankle weights with standing and seated exercise today.  Verbal cues from PT for abdominal contraction with exercise to improve balance.  Pt with fatigue with exercise today so session ended early.  Pt will continue to benefit from skilled PT for strength, balance and endurance exercise.     Rehab Potential Excellent   Clinical Impairments Affecting Rehab Potential history of TIA; COPD; on coumadin; history of CVA   PT Frequency 2x / week   PT Duration 8 weeks   PT Treatment/Interventions Patient/family education;Stair training;Gait training;Therapeutic activities;Therapeutic exercise;Balance training;Neuromuscular re-education   PT Next Visit Plan balance, strength, endurance   PT Home Exercise Plan initial certification is signed   Consulted and Agree with Plan of Care Patient      Patient will benefit from skilled therapeutic  intervention in order to improve the following deficits and impairments:  Abnormal gait, Difficulty walking, Decreased strength, Decreased endurance  Visit Diagnosis: Muscle weakness (generalized)  Other abnormalities of gait and mobility  Problem List Patient Active Problem List   Diagnosis Date Noted  . Bilateral lower extremity edema - mild 05/02/2013  . Cigarette smoker one half pack a day or less unmotivated he quit -  05/02/2013  . Chronic atrial fibrillation ([place]) 04/30/2013  . Essential hypertension 04/30/2013  . Long term (current) use of anticoagulants 10/03/2012  . CVA (cerebral vascular accident) ([place]) 10/03/2012  . H/O mitral valve replacement 10/03/2012    Sigurd Sos, PT 12/14/16 1:06 PM  [facility] [address], [place], [place], [address] Phone: [phone]   Fax:  [phone]  Name: Tina Wade MRN: [id number] Date of Birth: 1929/07/07

## 2016-12-18 ENCOUNTER — Ambulatory Visit: Payer: Medicare Other | Attending: Family Medicine

## 2016-12-18 DIAGNOSIS — R2689 Other abnormalities of gait and mobility: Secondary | ICD-10-CM | POA: Diagnosis present

## 2016-12-18 DIAGNOSIS — M6281 Muscle weakness (generalized): Secondary | ICD-10-CM

## 2016-12-18 NOTE — Therapy (Signed)
[facility] [address] [place], [place], [address] Phone: [phone]   Fax:  [phone]  Physical Therapy Treatment  Patient Details  Name: Tina Wade MRN: [id number] Date of Birth: 08/22/1928 Referring Provider: Dr. Dierdre Forth  Encounter Date: 12/18/2016      PT End of Session - 12/18/16 1224    Visit Number 4   Number of Visits 10   Date for PT Re-Evaluation 01/29/17   Authorization Type medicare g-code on 10th; Kx modifier on 15th   PT Start Time 1158  pt late   PT Stop Time 1228   PT Time Calculation (min) 30 min   Activity Tolerance Patient tolerated treatment well   Behavior During Therapy [facility] for tasks assessed/performed      Past Medical History:  Diagnosis Date  . Arrhythmia    chronic atrial fib. with controlled rate ,on warfarin therapy  . High cholesterol   . Hypertension   . Lower extremity edema    mild -stable  . Mitral valve stenosis 10/1994   s/p St. Jude mechanical valve placement -21M-101 MODEL  . Stroke ([place]) 08/2010   from office visit 2013- the setting of normal INR of 2.8 ;therefore increased goal to 3.5  . Thyroid disease    hypothyroidism on synthroid    Past Surgical History:  Procedure Laterality Date  . CAROTID DOPPLER  04/25/2012   right ICA SMALL AMT FIBROUS PLAQUE;LEFT BULB SMALL TO MODERATE AMT OF MIXED DENSITY PLAQUE 50-69%; LEFT ICA 0-49%  . DOPPLER ECHOCARDIOGRAPHY  09/13/2011   mild concentric LVH ,EF 45-50%,mild to moderate dilated right ventricle ,moderately dilated left atrium,prosthetic vlve noemal geadients and normal functioning bileaflet valve.The aortic valve had only trace regurg. and was mildly sclerotic and the septal wall motion with post-op state makes the echo EF difficult to assess  . MITRAL VALVE SURGERY  10/1994  . NM MYOCAR PERF WALL MOTION  03/08/2004   done in [place]-----adenosine--normal study,no ischemia  . TRANSESOPHAGEAL  ECHOCARDIOGRAM  08/2010   showed mild concentric LVH, EF 50-60% with moderate aortic regurg,calcified mitral annnulus with mechanical  prosthesis well functioning smoke inthe left atrial cavity noted;mod. to severe dilated. this when she was diagnosise with afib did not undergo cardioversion    There were no vitals filed for this visit.      Subjective Assessment - 12/18/16 1156    Subjective I have been more consistent with my exercises.     Currently in Pain? No/denies                         [facility] Adult PT Treatment/Exercise - 12/18/16 0001      Knee/Hip Exercises: Aerobic   Nustep level 1x 8 min ;seat #7, arms #11     Knee/Hip Exercises: Standing   Heel Raises --   Hip Abduction Stengthening;Both;2 sets;10 reps   Abduction Limitations 3#   Hip Extension Stengthening;Both;2 sets;10 reps   Extension Limitations 3#   Rocker Board 3 minutes   Rebounder weight shifting 3 ways x1 min each     Knee/Hip Exercises: Seated   Long Arc Quad Strengthening;Right;Left;2 sets;15 reps;Weights   Long Arc Quad Weight 3 lbs.   Marching Limitations 2x10   Marching Weights 3 lbs.                  PT Short Term Goals - 12/18/16 1158      PT SHORT TERM GOAL #1  Title independent with initial HEP for balance   Status Achieved     PT SHORT TERM GOAL #2   Title understand tips to avoid falls   Status Achieved     PT SHORT TERM GOAL #3   Title Berg balance score is >/= 45/56 due to improved strength   Period Weeks   Status On-going     PT SHORT TERM GOAL #4   Title TUG score is </= 15 sec with no assistive device  due to improve fear of falls    Time 4   Period Weeks   Status On-going           PT Long Term Goals - 12/04/16 1245      PT LONG TERM GOAL #1   Title independent with HEP and understands how to progress herself   Time 8   Period Weeks   Status New     PT LONG TERM GOAL #2   Title walking 15 min. per day in a safe place with no hills  using her cane to build her endurance   Time 8   Period Weeks   Status New     PT LONG TERM GOAL #3   Title Berg balance score is >/= 52/56 due to increased bilateral lower strength >/= 4/5    Time 8   Period Weeks   Status New     PT LONG TERM GOAL #4   Title TUG score is </= 13 seconds with no assistive device due to reduction in fear of falls   Time 8   Period Weeks   Status New     PT LONG TERM GOAL #5   Title ability to go up and down a curb and step with single point cane safely due to increased strength   Time 8   Period Weeks   Status New               Plan - 12/18/16 1200    Clinical Impression Statement Pt is more consistent with her HEP now.  Pt tolerates all exercise well in the clinic.  Pt demonstrates instability with gait on level surface with use of her cane.  Pt is activating her core with standing exercises with fewer verbal uces.  Pt will continue to benefit from skilled PT for LE strength, endurance and mobility to improve safety at home and in the community.     Rehab Potential Excellent   Clinical Impairments Affecting Rehab Potential history of TIA; COPD; on coumadin; history of CVA   PT Frequency 2x / week   PT Duration 8 weeks   PT Treatment/Interventions Patient/family education;Stair training;Gait training;Therapeutic activities;Therapeutic exercise;Balance training;Neuromuscular re-education   PT Next Visit Plan balance, strength, endurance   Recommended Other Services initial certification is signed   Consulted and Agree with Plan of Care Patient      Patient will benefit from skilled therapeutic intervention in order to improve the following deficits and impairments:  Abnormal gait, Difficulty walking, Decreased strength, Decreased endurance  Visit Diagnosis: Muscle weakness (generalized)  Other abnormalities of gait and mobility     Problem List Patient Active Problem List   Diagnosis Date Noted  . Bilateral lower extremity  edema - mild 05/02/2013  . Cigarette smoker one half pack a day or less unmotivated he quit -  05/02/2013  . Chronic atrial fibrillation ([place]) 04/30/2013  . Essential hypertension 04/30/2013  . Long term (current) use of anticoagulants 10/03/2012  . CVA (cerebral  vascular accident) ([place]) 10/03/2012  . H/O mitral valve replacement 10/03/2012    Sigurd Sos, PT 12/18/16 12:29 PM  [facility] [address] [place], [place], [address] Phone: [phone]   Fax:  [phone]  Name: ZANYAH LENTSCH MRN: [id number] Date of Birth: 10/31/1928

## 2016-12-21 ENCOUNTER — Ambulatory Visit: Payer: Medicare Other | Admitting: Physical Therapy

## 2016-12-21 ENCOUNTER — Encounter: Payer: Self-pay | Admitting: Physical Therapy

## 2016-12-21 DIAGNOSIS — M6281 Muscle weakness (generalized): Secondary | ICD-10-CM | POA: Diagnosis not present

## 2016-12-21 DIAGNOSIS — R2689 Other abnormalities of gait and mobility: Secondary | ICD-10-CM

## 2016-12-21 NOTE — Therapy (Signed)
[facility] [address] [place], [place], [address] Phone: [phone]   Fax:  [phone]  Physical Therapy Treatment  Patient Details  Name: MIRABEL AHLGREN MRN: [id number] Date of Birth: 10-29-1928 Referring Provider: Dr. Dierdre Forth  Encounter Date: 12/21/2016      PT End of Session - 12/21/16 1132    Visit Number 5   Number of Visits 10   Date for PT Re-Evaluation 01/29/17   Authorization Type medicare g-code on 10th; Kx modifier on 15th   PT Start Time 1100   PT Stop Time 1140   PT Time Calculation (min) 40 min   Activity Tolerance Patient tolerated treatment well   Behavior During Therapy [facility] for tasks assessed/performed      Past Medical History:  Diagnosis Date  . Arrhythmia    chronic atrial fib. with controlled rate ,on warfarin therapy  . High cholesterol   . Hypertension   . Lower extremity edema    mild -stable  . Mitral valve stenosis 10/1994   s/p St. Jude mechanical valve placement -58M-101 MODEL  . Stroke ([place]) 08/2010   from office visit 2013- the setting of normal INR of 2.8 ;therefore increased goal to 3.5  . Thyroid disease    hypothyroidism on synthroid    Past Surgical History:  Procedure Laterality Date  . CAROTID DOPPLER  04/25/2012   right ICA SMALL AMT FIBROUS PLAQUE;LEFT BULB SMALL TO MODERATE AMT OF MIXED DENSITY PLAQUE 50-69%; LEFT ICA 0-49%  . DOPPLER ECHOCARDIOGRAPHY  09/13/2011   mild concentric LVH ,EF 45-50%,mild to moderate dilated right ventricle ,moderately dilated left atrium,prosthetic vlve noemal geadients and normal functioning bileaflet valve.The aortic valve had only trace regurg. and was mildly sclerotic and the septal wall motion with post-op state makes the echo EF difficult to assess  . MITRAL VALVE SURGERY  10/1994  . NM MYOCAR PERF WALL MOTION  03/08/2004   done in [place]-----adenosine--normal study,no ischemia  . TRANSESOPHAGEAL ECHOCARDIOGRAM  08/2010    showed mild concentric LVH, EF 50-60% with moderate aortic regurg,calcified mitral annnulus with mechanical  prosthesis well functioning smoke inthe left atrial cavity noted;mod. to severe dilated. this when she was diagnosise with afib did not undergo cardioversion    There were no vitals filed for this visit.      Subjective Assessment - 12/21/16 1104    Patient Stated Goals Improve balance                         [facility] Adult PT Treatment/Exercise - 12/21/16 0001      Knee/Hip Exercises: Aerobic   Nustep level 3x 8 min ;seat #10, arms #11     Knee/Hip Exercises: Standing   Heel Raises Left;Right;1 set;10 reps  single leg stance   Heel Raises Limitations lift left heel higher than right   Hip Abduction Stengthening;Both;2 sets;10 reps   Abduction Limitations 4#   Hip Extension Stengthening;Both;2 sets;10 reps   Extension Limitations 4#   Rebounder weight shifting 3 ways x1 min each   Other Standing Knee Exercises stand and turn 360 in 4 sec 3 times each way     Knee/Hip Exercises: Seated   Long Arc Quad Strengthening;Right;Left;2 sets;15 reps;Weights   Long Arc Quad Weight 4 lbs.   Marching Limitations 2x10   Marching Weights 4 lbs.   Sit to Sand 2 sets;5 reps;without UE support  PT Short Term Goals - 12/18/16 1158      PT SHORT TERM GOAL #1   Title independent with initial HEP for balance   Status Achieved     PT SHORT TERM GOAL #2   Title understand tips to avoid falls   Status Achieved     PT SHORT TERM GOAL #3   Title Berg balance score is >/= 45/56 due to improved strength   Period Weeks   Status On-going     PT SHORT TERM GOAL #4   Title TUG score is </= 15 sec with no assistive device  due to improve fear of falls    Time 4   Period Weeks   Status On-going           PT Long Term Goals - 12/04/16 1245      PT LONG TERM GOAL #1   Title independent with HEP and understands how to progress herself    Time 8   Period Weeks   Status New     PT LONG TERM GOAL #2   Title walking 15 min. per day in a safe place with no hills using her cane to build her endurance   Time 8   Period Weeks   Status New     PT LONG TERM GOAL #3   Title Berg balance score is >/= 52/56 due to increased bilateral lower strength >/= 4/5    Time 8   Period Weeks   Status New     PT LONG TERM GOAL #4   Title TUG score is </= 13 seconds with no assistive device due to reduction in fear of falls   Time 8   Period Weeks   Status New     PT LONG TERM GOAL #5   Title ability to go up and down a curb and step with single point cane safely due to increased strength   Time 8   Period Weeks   Status New               Plan - 12/21/16 1120    Clinical Impression Statement Patient able to go from sit to stand without arm support.  Patient is now able to turn 360 degrees in 4 seconds both ways. Patient is able to stand on one foot with holding on and do heel raise but right not as high than left. Patient reports she has increased confidence with walking.  Paitent will benefit from skilled PT for LE strenght, endurance and mobility to improve safety at home and in the community.    Rehab Potential Excellent   Clinical Impairments Affecting Rehab Potential history of TIA; COPD; on coumadin; history of CVA   PT Frequency 2x / week   PT Duration 8 weeks   PT Treatment/Interventions Patient/family education;Stair training;Gait training;Therapeutic activities;Therapeutic exercise;Balance training;Neuromuscular re-education   PT Next Visit Plan work on tandem stance, reaching forward   PT Home Exercise Plan initial certification is signed   Consulted and Agree with Plan of Care Patient      Patient will benefit from skilled therapeutic intervention in order to improve the following deficits and impairments:  Abnormal gait, Difficulty walking, Decreased strength, Decreased endurance  Visit Diagnosis: Muscle  weakness (generalized)  Other abnormalities of gait and mobility     Problem List Patient Active Problem List   Diagnosis Date Noted  . Bilateral lower extremity edema - mild 05/02/2013  . Cigarette smoker one half pack a day or less  unmotivated he quit -  05/02/2013  . Chronic atrial fibrillation ([place]) 04/30/2013  . Essential hypertension 04/30/2013  . Long term (current) use of anticoagulants 10/03/2012  . CVA (cerebral vascular accident) ([place]) 10/03/2012  . H/O mitral valve replacement 10/03/2012    Earlie Counts, PT 12/21/16 11:38 AM   [facility] [address] [place], [place], [address] Phone: [phone]   Fax:  [phone]  Name: SHAWNTRICE SALLE MRN: [id number] Date of Birth: November 10, 1928

## 2016-12-26 ENCOUNTER — Ambulatory Visit: Payer: Medicare Other | Admitting: Physical Therapy

## 2016-12-26 ENCOUNTER — Encounter: Payer: Self-pay | Admitting: Physical Therapy

## 2016-12-26 DIAGNOSIS — R2689 Other abnormalities of gait and mobility: Secondary | ICD-10-CM

## 2016-12-26 DIAGNOSIS — M6281 Muscle weakness (generalized): Secondary | ICD-10-CM

## 2016-12-26 NOTE — Therapy (Signed)
[facility] [address], [place], [place], [address] Phone: [phone]   Fax:  [phone]  Physical Therapy Treatment  Patient Details  Name: Tina Wade MRN: [id number] Date of Birth: 01-Oct-1928 Referring Provider: Dr. Dierdre Forth  Encounter Date: 12/26/2016      Tina Wade End of Session - 12/26/16 1317    Visit Number 6   Number of Visits 10   Date for Tina Wade Re-Evaluation 01/29/17   Authorization Type medicare g-code on 10th; Kx modifier on 15th   Tina Wade Start Time 1230   Tina Wade Stop Time 1310   Tina Wade Time Calculation (min) 40 min   Activity Tolerance Patient tolerated treatment well   Behavior During Therapy [facility] for tasks assessed/performed      Past Medical History:  Diagnosis Date  . Arrhythmia    chronic atrial fib. with controlled rate ,on warfarin therapy  . High cholesterol   . Hypertension   . Lower extremity edema    mild -stable  . Mitral valve stenosis 10/1994   s/p St. Jude mechanical valve placement -38M-101 MODEL  . Stroke ([place]) 08/2010   from office visit 2013- the setting of normal INR of 2.8 ;therefore increased goal to 3.5  . Thyroid disease    hypothyroidism on synthroid    Past Surgical History:  Procedure Laterality Date  . CAROTID DOPPLER  04/25/2012   right ICA SMALL AMT FIBROUS PLAQUE;LEFT BULB SMALL TO MODERATE AMT OF MIXED DENSITY PLAQUE 50-69%; LEFT ICA 0-49%  . DOPPLER ECHOCARDIOGRAPHY  09/13/2011   mild concentric LVH ,EF 45-50%,mild to moderate dilated right ventricle ,moderately dilated left atrium,prosthetic vlve noemal geadients and normal functioning bileaflet valve.The aortic valve had only trace regurg. and was mildly sclerotic and the septal wall motion with post-op state makes the echo EF difficult to assess  . MITRAL VALVE SURGERY  10/1994  . NM MYOCAR PERF WALL MOTION  03/08/2004   done in [place]-----adenosine--normal study,no ischemia  . TRANSESOPHAGEAL ECHOCARDIOGRAM  08/2010    showed mild concentric LVH, EF 50-60% with moderate aortic regurg,calcified mitral annnulus with mechanical  prosthesis well functioning smoke inthe left atrial cavity noted;mod. to severe dilated. this when she was diagnosise with afib did not undergo cardioversion    There were no vitals filed for this visit.      Subjective Assessment - 12/26/16 1240    Subjective My balance is better.  I have trouble backing up.    Patient Stated Goals Improve balance   Currently in Pain? No/denies                         [facility] Adult Tina Wade Treatment/Exercise - 12/26/16 0001      Knee/Hip Exercises: Aerobic   Nustep level 3x 10 min ;seat #10, arms #11     Knee/Hip Exercises: Standing   Heel Raises Both;1 set;15 reps;1 second  holding on   Heel Raises Limitations 10x on each leg holding on   Hip Abduction Stengthening;Both;2 sets;10 reps   Abduction Limitations 4#   Hip Extension Stengthening;Both;2 sets;10 reps   Extension Limitations 4#   Rocker Board 1 minute  eyes open   Rocker Board Limitations 1 min eyes closed   Rebounder weight shifting 3 ways x1 min each   Other Standing Knee Exercises walk up steps and down 5 times holding rails     Knee/Hip Exercises: Seated   Long Arc Quad Strengthening;Right;Left;2 sets;15 reps;Weights   Long [employer]  Weight 4 lbs.   Long Arc Quad Limitations 10x with 5#   Marching Limitations 2x10   Marching Weights 4 lbs.   Sit to Sand 1 set;5 reps;without UE support                  Tina Wade Short Term Goals - 12/26/16 1329      Tina Wade SHORT TERM GOAL #3   Title Berg balance score is >/= 45/56 due to improved strength   Time 4   Period Weeks   Status On-going     Tina Wade SHORT TERM GOAL #4   Title TUG score is </= 15 sec with no assistive device  due to improve fear of falls    Time 4   Period Weeks   Status On-going           Tina Wade Long Term Goals - 12/04/16 1245      Tina Wade LONG TERM GOAL #1   Title independent with HEP and  understands how to progress herself   Time 8   Period Weeks   Status New     Tina Wade LONG TERM GOAL #2   Title walking 15 min. per day in a safe place with no hills using her cane to build her endurance   Time 8   Period Weeks   Status New     Tina Wade LONG TERM GOAL #3   Title Berg balance score is >/= 52/56 due to increased bilateral lower strength >/= 4/5    Time 8   Period Weeks   Status New     Tina Wade LONG TERM GOAL #4   Title TUG score is </= 13 seconds with no assistive device due to reduction in fear of falls   Time 8   Period Weeks   Status New     Tina Wade LONG TERM GOAL #5   Title ability to go up and down a curb and step with single point cane safely due to increased strength   Time 8   Period Weeks   Status New               Plan - 12/26/16 1318    Clinical Impression Statement Patient able to go from sit to stand 5 times with hands instead of 10x.  Patient feels she is able to do more at home due to increased balance. Patient was able to walk steadier. Patient able to increase hip extension with stairs.  Patient will benefit form skilled therapy from skilled Tina Wade for LE strength, endurance and mobility to improve safety at home and in the community.    Rehab Potential Excellent   Clinical Impairments Affecting Rehab Potential history of TIA; COPD; on coumadin; history of CVA   Tina Wade Frequency 2x / week   Tina Wade Duration 8 weeks   Tina Wade Treatment/Interventions Patient/family education;Stair training;Gait training;Therapeutic activities;Therapeutic exercise;Balance training;Neuromuscular re-education   Tina Wade Next Visit Plan work on tandem stance, reaching forward; TUG   Recommended Other Services initial certification is signed   Consulted and Agree with Plan of Care Patient      Patient will benefit from skilled therapeutic intervention in order to improve the following deficits and impairments:  Abnormal gait, Difficulty walking, Decreased strength, Decreased endurance  Visit  Diagnosis: Muscle weakness (generalized)  Other abnormalities of gait and mobility     Problem List Patient Active Problem List   Diagnosis Date Noted  . Bilateral lower extremity edema - mild 05/02/2013  . Cigarette smoker one half pack a  day or less unmotivated he quit -  05/02/2013  . Chronic atrial fibrillation ([place]) 04/30/2013  . Essential hypertension 04/30/2013  . Long term (current) use of anticoagulants 10/03/2012  . CVA (cerebral vascular accident) ([place]) 10/03/2012  . H/O mitral valve replacement 10/03/2012    Tina Wade, Tina Wade 12/26/16 1:31 PM   [facility] [address], [place], [place], [address] Phone: [phone]   Fax:  [phone]  Name: Tina Wade MRN: [id number] Date of Birth: 07-Aug-1928

## 2016-12-28 ENCOUNTER — Ambulatory Visit: Payer: Medicare Other

## 2016-12-28 DIAGNOSIS — M6281 Muscle weakness (generalized): Secondary | ICD-10-CM | POA: Diagnosis not present

## 2016-12-28 DIAGNOSIS — R2689 Other abnormalities of gait and mobility: Secondary | ICD-10-CM

## 2016-12-28 NOTE — Therapy (Signed)
[facility] [address] [place], [place], [address] Phone: [phone]   Fax:  [phone]  Physical Therapy Treatment  Patient Details  Name: Tina Wade MRN: [id number] Date of Birth: [birth year] Referring Provider: Dr. Dierdre Forth  Encounter Date: 12/28/2016      PT End of Session - 12/28/16 1258    Visit Number 7   Number of Visits 10   Date for PT Re-Evaluation 01/29/17   Authorization Type medicare g-code on 10th; Kx modifier on 15th   PT Start Time 1230   PT Stop Time 1309   PT Time Calculation (min) 39 min   Activity Tolerance Patient tolerated treatment well   Behavior During Therapy [facility] for tasks assessed/performed      Past Medical History:  Diagnosis Date  . Arrhythmia    chronic atrial fib. with controlled rate ,on warfarin therapy  . High cholesterol   . Hypertension   . Lower extremity edema    mild -stable  . Mitral valve stenosis 10/1994   s/p St. Jude mechanical valve placement -50M-101 MODEL  . Stroke ([place]) 08/2010   from office visit 2013- the setting of normal INR of 2.8 ;therefore increased goal to 3.5  . Thyroid disease    hypothyroidism on synthroid    Past Surgical History:  Procedure Laterality Date  . CAROTID DOPPLER  04/25/2012   right ICA SMALL AMT FIBROUS PLAQUE;LEFT BULB SMALL TO MODERATE AMT OF MIXED DENSITY PLAQUE 50-69%; LEFT ICA 0-49%  . DOPPLER ECHOCARDIOGRAPHY  09/13/2011   mild concentric LVH ,EF 45-50%,mild to moderate dilated right ventricle ,moderately dilated left atrium,prosthetic vlve noemal geadients and normal functioning bileaflet valve.The aortic valve had only trace regurg. and was mildly sclerotic and the septal wall motion with post-op state makes the echo EF difficult to assess  . MITRAL VALVE SURGERY  10/1994  . NM MYOCAR PERF WALL MOTION  03/08/2004   done in [place]-----adenosine--normal study,no ischemia  . TRANSESOPHAGEAL ECHOCARDIOGRAM  08/2010    showed mild concentric LVH, EF 50-60% with moderate aortic regurg,calcified mitral annnulus with mechanical  prosthesis well functioning smoke inthe left atrial cavity noted;mod. to severe dilated. this when she was diagnosise with afib did not undergo cardioversion    There were no vitals filed for this visit.      Subjective Assessment - 12/28/16 1230    Subjective My balance is better.     Currently in Pain? No/denies                         [facility] Adult PT Treatment/Exercise - 12/28/16 0001      Knee/Hip Exercises: Aerobic   Nustep level 3x 10 min ;seat #10, arms #11     Knee/Hip Exercises: Standing   Heel Raises Both;15 reps;1 second;2 sets  holding on   Hip Abduction Stengthening;Both;2 sets;10 reps   Abduction Limitations 5#   Hip Extension Stengthening;Both;2 sets;10 reps   Extension Limitations 5#   Rocker Board 1 minute  eyes open   Rocker Board Limitations 1 min eyes closed   Rebounder weight shifting 3 ways x1 min each   Other Standing Knee Exercises tandem stance x 30 seconds each   Other Standing Knee Exercises stand and turn 360 in 4 sec 3 times each way     Knee/Hip Exercises: Seated   Long Arc Quad Strengthening;Right;Left;2 sets;15 reps;Weights   Long Arc Quad Weight 5 lbs.   Marching Limitations 2x10  Marching Weights 5 lbs.   Sit to Sand 1 set;without UE support;10 reps                  PT Short Term Goals - 12/26/16 1329      PT SHORT TERM GOAL #3   Title Berg balance score is >/= 45/56 due to improved strength   Time 4   Period Weeks   Status On-going     PT SHORT TERM GOAL #4   Title TUG score is </= 15 sec with no assistive device  due to improve fear of falls    Time 4   Period Weeks   Status On-going           PT Long Term Goals - 12/04/16 1245      PT LONG TERM GOAL #1   Title independent with HEP and understands how to progress herself   Time 8   Period Weeks   Status New     PT LONG TERM GOAL  #2   Title walking 15 min. per day in a safe place with no hills using her cane to build her endurance   Time 8   Period Weeks   Status New     PT LONG TERM GOAL #3   Title Berg balance score is >/= 52/56 due to increased bilateral lower strength >/= 4/5    Time 8   Period Weeks   Status New     PT LONG TERM GOAL #4   Title TUG score is </= 13 seconds with no assistive device due to reduction in fear of falls   Time 8   Period Weeks   Status New     PT LONG TERM GOAL #5   Title ability to go up and down a curb and step with single point cane safely due to increased strength   Time 8   Period Weeks   Status New             Patient will benefit from skilled therapeutic intervention in order to improve the following deficits and impairments:     Visit Diagnosis: Muscle weakness (generalized)  Other abnormalities of gait and mobility     Problem List Patient Active Problem List   Diagnosis Date Noted  . Bilateral lower extremity edema - mild 05/02/2013  . Cigarette smoker one half pack a day or less unmotivated he quit -  05/02/2013  . Chronic atrial fibrillation ([place]) 04/30/2013  . Essential hypertension 04/30/2013  . Long term (current) use of anticoagulants 10/03/2012  . CVA (cerebral vascular accident) ([place]) 10/03/2012  . H/O mitral valve replacement 10/03/2012     Sigurd Sos, PT 12/28/16 1:01 PM  [facility] [address] [place], [place], [address] Phone: [phone]   Fax:  [phone]  Name: Tina Wade MRN: [id number] Date of Birth: December 28, 1928

## 2017-01-01 ENCOUNTER — Ambulatory Visit: Payer: Medicare Other | Admitting: Physical Therapy

## 2017-01-01 ENCOUNTER — Encounter: Payer: Self-pay | Admitting: Physical Therapy

## 2017-01-01 DIAGNOSIS — R2689 Other abnormalities of gait and mobility: Secondary | ICD-10-CM

## 2017-01-01 DIAGNOSIS — M6281 Muscle weakness (generalized): Secondary | ICD-10-CM

## 2017-01-01 NOTE — Therapy (Signed)
[facility] [address] [place], [place], [address] Phone: [phone]   Fax:  [phone]  Physical Therapy Treatment  Patient Details  Name: Tina Wade MRN: [id number] Date of Birth: [birth year] Referring Provider: Dr. Dierdre Forth  Encounter Date: 01/01/2017      PT End of Session - 01/01/17 1152    Visit Number 8   Number of Visits 10   Date for PT Re-Evaluation 01/29/17   Authorization Type medicare g-code on 10th; Kx modifier on 15th   PT Start Time 1145   PT Stop Time 1225   PT Time Calculation (min) 40 min   Activity Tolerance Patient tolerated treatment well   Behavior During Therapy [facility] for tasks assessed/performed      Past Medical History:  Diagnosis Date  . Arrhythmia    chronic atrial fib. with controlled rate ,on warfarin therapy  . High cholesterol   . Hypertension   . Lower extremity edema    mild -stable  . Mitral valve stenosis 10/1994   s/p St. Jude mechanical valve placement -38M-101 MODEL  . Stroke ([place]) 08/2010   from office visit 2013- the setting of normal INR of 2.8 ;therefore increased goal to 3.5  . Thyroid disease    hypothyroidism on synthroid    Past Surgical History:  Procedure Laterality Date  . CAROTID DOPPLER  04/25/2012   right ICA SMALL AMT FIBROUS PLAQUE;LEFT BULB SMALL TO MODERATE AMT OF MIXED DENSITY PLAQUE 50-69%; LEFT ICA 0-49%  . DOPPLER ECHOCARDIOGRAPHY  09/13/2011   mild concentric LVH ,EF 45-50%,mild to moderate dilated right ventricle ,moderately dilated left atrium,prosthetic vlve noemal geadients and normal functioning bileaflet valve.The aortic valve had only trace regurg. and was mildly sclerotic and the septal wall motion with post-op state makes the echo EF difficult to assess  . MITRAL VALVE SURGERY  10/1994  . NM MYOCAR PERF WALL MOTION  03/08/2004   done in [place]-----adenosine--normal study,no ischemia  . TRANSESOPHAGEAL ECHOCARDIOGRAM  08/2010    showed mild concentric LVH, EF 50-60% with moderate aortic regurg,calcified mitral annnulus with mechanical  prosthesis well functioning smoke inthe left atrial cavity noted;mod. to severe dilated. this when she was diagnosise with afib did not undergo cardioversion    There were no vitals filed for this visit.      Subjective Assessment - 01/01/17 1150    Subjective I am walking around the house better. I was sore with the 5# weights on legs for exercise.    Patient Stated Goals Improve balance   Currently in Pain? No/denies   Multiple Pain Sites No            [facility] PT Assessment - 01/01/17 0001      Timed Up and Go Test   TUG Normal TUG   Normal TUG (seconds) 21  no assistive device; 18, 18                     [facility] Adult PT Treatment/Exercise - 01/01/17 0001      Knee/Hip Exercises: Aerobic   Stationary Bike level 1 6 min     Knee/Hip Exercises: Standing   Heel Raises Both;15 reps;1 second;2 sets  holding on   Heel Raises Limitations 10x on each leg holding on   Hip Abduction Stengthening;Both;2 sets;10 reps   Abduction Limitations 4#   Hip Extension Stengthening;Both;2 sets;10 reps   Extension Limitations 4#   Rebounder weight shifting 3 ways x1 min each  Other Standing Knee Exercises alternating toe tap on 6 inch step with finger tip holding on     Knee/Hip Exercises: Seated   Long Arc Quad Strengthening;Right;Left;2 sets;15 reps;Weights   Long Arc Quad Weight 4 lbs.   Marching Limitations 20   Marching Weights 4 lbs.                  PT Short Term Goals - 01/01/17 1234      PT SHORT TERM GOAL #3   Title Berg balance score is >/= 45/56 due to improved strength   Time 4   Period Weeks   Status On-going     PT SHORT TERM GOAL #4   Title TUG score is </= 15 sec with no assistive device  due to improve fear of falls    Time 4   Period Weeks   Status On-going  18 sec           PT Long Term Goals - 12/04/16 1245      PT  LONG TERM GOAL #1   Title independent with HEP and understands how to progress herself   Time 8   Period Weeks   Status New     PT LONG TERM GOAL #2   Title walking 15 min. per day in a safe place with no hills using her cane to build her endurance   Time 8   Period Weeks   Status New     PT LONG TERM GOAL #3   Title Berg balance score is >/= 52/56 due to increased bilateral lower strength >/= 4/5    Time 8   Period Weeks   Status New     PT LONG TERM GOAL #4   Title TUG score is </= 13 seconds with no assistive device due to reduction in fear of falls   Time 8   Period Weeks   Status New     PT LONG TERM GOAL #5   Title ability to go up and down a curb and step with single point cane safely due to increased strength   Time 8   Period Weeks   Status New               Plan - 01/01/17 1147    Clinical Impression Statement Patient TUG score is the same.  Patient is steadier on her feet while walking. Patient able to go up and down steps with alternating pattern after verbal cues.  Patient is steadier on the trampoling and not nervous. Patient will benefit from skilled therapy to improve balance and strength.    Rehab Potential Excellent   Clinical Impairments Affecting Rehab Potential history of TIA; COPD; on coumadin; history of CVA   PT Frequency 2x / week   PT Duration 8 weeks   PT Treatment/Interventions Patient/family education;Stair training;Gait training;Therapeutic activities;Therapeutic exercise;Balance training;Neuromuscular re-education   PT Next Visit Plan work on tandem stance, reaching forward; Oceanographer   PT Home Exercise Plan initial certification is signed   Consulted and Agree with Plan of Care Patient      Patient will benefit from skilled therapeutic intervention in order to improve the following deficits and impairments:  Abnormal gait, Difficulty walking, Decreased strength, Decreased endurance  Visit Diagnosis: Muscle weakness  (generalized)  Other abnormalities of gait and mobility     Problem List Patient Active Problem List   Diagnosis Date Noted  . Bilateral lower extremity edema - mild 05/02/2013  . Cigarette smoker one  half pack a day or less unmotivated he quit -  05/02/2013  . Chronic atrial fibrillation ([place]) 04/30/2013  . Essential hypertension 04/30/2013  . Long term (current) use of anticoagulants 10/03/2012  . CVA (cerebral vascular accident) ([place]) 10/03/2012  . H/O mitral valve replacement 10/03/2012    Earlie Counts, PT 01/01/17 12:36 PM   [facility] [address], [place], [place], [address] Phone: [phone]   Fax:  [phone]  Name: Tina Wade MRN: [id number] Date of Birth: 10/14/1928

## 2017-01-03 ENCOUNTER — Ambulatory Visit: Payer: Medicare Other

## 2017-01-03 DIAGNOSIS — M6281 Muscle weakness (generalized): Secondary | ICD-10-CM | POA: Diagnosis not present

## 2017-01-03 DIAGNOSIS — R2689 Other abnormalities of gait and mobility: Secondary | ICD-10-CM

## 2017-01-03 NOTE — Therapy (Signed)
[facility] [address] [place], [place], [address] Phone: [phone]   Fax:  [phone]  Physical Therapy Treatment  Patient Details  Name: Tina Wade MRN: [id number] Date of Birth: [birth year] Referring Provider: Dr. Dierdre Forth  Encounter Date: 01/03/2017      PT End of Session - 01/03/17 1225    Visit Number 9   Number of Visits 10   Date for PT Re-Evaluation 01/29/17   Authorization Type medicare g-code on 10th; Kx modifier on 15th   PT Start Time 1144   PT Stop Time 1226   PT Time Calculation (min) 42 min   Activity Tolerance Patient tolerated treatment well   Behavior During Therapy [facility] for tasks assessed/performed      Past Medical History:  Diagnosis Date  . Arrhythmia    chronic atrial fib. with controlled rate ,on warfarin therapy  . High cholesterol   . Hypertension   . Lower extremity edema    mild -stable  . Mitral valve stenosis 10/1994   s/p St. Jude mechanical valve placement -15M-101 MODEL  . Stroke ([place]) 08/2010   from office visit 2013- the setting of normal INR of 2.8 ;therefore increased goal to 3.5  . Thyroid disease    hypothyroidism on synthroid    Past Surgical History:  Procedure Laterality Date  . CAROTID DOPPLER  04/25/2012   right ICA SMALL AMT FIBROUS PLAQUE;LEFT BULB SMALL TO MODERATE AMT OF MIXED DENSITY PLAQUE 50-69%; LEFT ICA 0-49%  . DOPPLER ECHOCARDIOGRAPHY  09/13/2011   mild concentric LVH ,EF 45-50%,mild to moderate dilated right ventricle ,moderately dilated left atrium,prosthetic vlve noemal geadients and normal functioning bileaflet valve.The aortic valve had only trace regurg. and was mildly sclerotic and the septal wall motion with post-op state makes the echo EF difficult to assess  . MITRAL VALVE SURGERY  10/1994  . NM MYOCAR PERF WALL MOTION  03/08/2004   done in [place]-----adenosine--normal study,no ischemia  . TRANSESOPHAGEAL ECHOCARDIOGRAM  08/2010    showed mild concentric LVH, EF 50-60% with moderate aortic regurg,calcified mitral annnulus with mechanical  prosthesis well functioning smoke inthe left atrial cavity noted;mod. to severe dilated. this when she was diagnosise with afib did not undergo cardioversion    There were no vitals filed for this visit.      Subjective Assessment - 01/03/17 1149    Subjective I am walking better at home.     Currently in Pain? No/denies                         [facility] Adult PT Treatment/Exercise - 01/03/17 0001      Knee/Hip Exercises: Aerobic   Nustep level 3x 10 min ;seat #10, arms #11     Knee/Hip Exercises: Standing   Heel Raises Both;15 reps;1 second;2 sets  holding on   Hip Abduction Stengthening;Both;2 sets;10 reps   Abduction Limitations 4#   Hip Extension Stengthening;Both;2 sets;10 reps   Extension Limitations 4#   Rocker Board 1 minute  eyes open   Rebounder weight shifting 3 ways x1 min each   Other Standing Knee Exercises tandem stance x 30 seconds each   Other Standing Knee Exercises alternating toe tap on 6 inch step with finger tip holding on     Knee/Hip Exercises: Seated   Long Arc Quad Strengthening;Right;Left;2 sets;15 reps;Weights   Long Arc Quad Weight 4 lbs.   Marching Limitations 20   Marching Weights 4 lbs.  Sit to Sand 1 set;without UE support;10 reps                  PT Short Term Goals - 01/01/17 1234      PT SHORT TERM GOAL #3   Title Berg balance score is >/= 45/56 due to improved strength   Time 4   Period Weeks   Status On-going     PT SHORT TERM GOAL #4   Title TUG score is </= 15 sec with no assistive device  due to improve fear of falls    Time 4   Period Weeks   Status On-going  18 sec           PT Long Term Goals - 12/04/16 1245      PT LONG TERM GOAL #1   Title independent with HEP and understands how to progress herself   Time 8   Period Weeks   Status New     PT LONG TERM GOAL #2   Title  walking 15 min. per day in a safe place with no hills using her cane to build her endurance   Time 8   Period Weeks   Status New     PT LONG TERM GOAL #3   Title Berg balance score is >/= 52/56 due to increased bilateral lower strength >/= 4/5    Time 8   Period Weeks   Status New     PT LONG TERM GOAL #4   Title TUG score is </= 13 seconds with no assistive device due to reduction in fear of falls   Time 8   Period Weeks   Status New     PT LONG TERM GOAL #5   Title ability to go up and down a curb and step with single point cane safely due to increased strength   Time 8   Period Weeks   Status New               Plan - 01/03/17 1148    Clinical Impression Statement Pt reports improved stability with walking now.  Pt is able to ascend and descend steps with step-over-step gait with verbal cueing.  Pt with chronic balance deficits.  No change in TUG this week.  Pt will continue to benefit from skilled PT for strength, balance and endurance training to improve safety in the community.     Rehab Potential Excellent   Clinical Impairments Affecting Rehab Potential history of TIA; COPD; on coumadin; history of CVA   PT Frequency 2x / week   PT Duration 8 weeks   PT Treatment/Interventions Patient/family education;Stair training;Gait training;Therapeutic activities;Therapeutic exercise;Balance training;Neuromuscular re-education   PT Next Visit Plan balance activities, strength, Berg balance   Consulted and Agree with Plan of Care Patient      Patient will benefit from skilled therapeutic intervention in order to improve the following deficits and impairments:  Abnormal gait, Difficulty walking, Decreased strength, Decreased endurance  Visit Diagnosis: Muscle weakness (generalized)  Other abnormalities of gait and mobility     Problem List Patient Active Problem List   Diagnosis Date Noted  . Bilateral lower extremity edema - mild 05/02/2013  . Cigarette smoker one  half pack a day or less unmotivated he quit -  05/02/2013  . Chronic atrial fibrillation ([place]) 04/30/2013  . Essential hypertension 04/30/2013  . Long term (current) use of anticoagulants 10/03/2012  . CVA (cerebral vascular accident) ([place]) 10/03/2012  . H/O mitral valve replacement 10/03/2012  Sigurd Sos, PT 01/03/17 12:27 PM   [facility] [address] [place], [place], [address] Phone: [phone]   Fax:  [phone]  Name: Tina Wade MRN: [id number] Date of Birth: [birth year]

## 2017-01-10 ENCOUNTER — Encounter: Payer: No Typology Code available for payment source | Admitting: Physical Therapy

## 2017-01-15 ENCOUNTER — Ambulatory Visit: Payer: Medicare Other | Attending: Family Medicine

## 2017-01-15 DIAGNOSIS — R2689 Other abnormalities of gait and mobility: Secondary | ICD-10-CM | POA: Diagnosis present

## 2017-01-15 DIAGNOSIS — M6281 Muscle weakness (generalized): Secondary | ICD-10-CM | POA: Diagnosis present

## 2017-01-15 NOTE — Therapy (Signed)
[facility] [address], [place], [place], [address] Phone: [phone]   Fax:  [phone]  Physical Therapy Treatment  Patient Details  Name: Tina Wade MRN: [id number] Date of Birth: 1928-10-25 Referring Provider: Dr. Dierdre Forth  Encounter Date: 01/15/2017      PT End of Session - 01/15/17 1442    Visit Number 10   PT Start Time 9983   PT Stop Time 1443   PT Time Calculation (min) 44 min   Activity Tolerance Patient tolerated treatment well   Behavior During Therapy [facility] for tasks assessed/performed      Past Medical History:  Diagnosis Date  . Arrhythmia    chronic atrial fib. with controlled rate ,on warfarin therapy  . High cholesterol   . Hypertension   . Lower extremity edema    mild -stable  . Mitral valve stenosis 10/1994   s/p St. Jude mechanical valve placement -63M-101 MODEL  . Stroke ([facility]) 08/2010   from office visit 2013- the setting of normal INR of 2.8 ;therefore increased goal to 3.5  . Thyroid disease    hypothyroidism on synthroid    Past Surgical History:  Procedure Laterality Date  . CAROTID DOPPLER  04/25/2012   right ICA SMALL AMT FIBROUS PLAQUE;LEFT BULB SMALL TO MODERATE AMT OF MIXED DENSITY PLAQUE 50-69%; LEFT ICA 0-49%  . DOPPLER ECHOCARDIOGRAPHY  09/13/2011   mild concentric LVH ,EF 45-50%,mild to moderate dilated right ventricle ,moderately dilated left atrium,prosthetic vlve noemal geadients and normal functioning bileaflet valve.The aortic valve had only trace regurg. and was mildly sclerotic and the septal wall motion with post-op state makes the echo EF difficult to assess  . MITRAL VALVE SURGERY  10/1994  . NM MYOCAR PERF WALL MOTION  03/08/2004   done in [place]-----adenosine--normal study,no ischemia  . TRANSESOPHAGEAL ECHOCARDIOGRAM  08/2010   showed mild concentric LVH, EF 50-60% with moderate aortic regurg,calcified mitral annnulus with mechanical  prosthesis well  functioning smoke inthe left atrial cavity noted;mod. to severe dilated. this when she was diagnosise with afib did not undergo cardioversion    There were no vitals filed for this visit.      Subjective Assessment - 01/15/17 1401    Subjective I will need to be discharged.  A lot going on at home and wont have as much time.  I just had a bone scan and discovered that I have osteoporosis.     Patient Stated Goals Improve balance   Currently in Pain? No/denies            [facility] PT Assessment - 01/15/17 0001      Assessment   Medical Diagnosis R26.89 Balance Problems     Precautions   Precaution Comments fall risk, osteoporosis     Prior Function   Level of Independence Independent     Cognition   Overall Cognitive Status Within Functional Limits for tasks assessed     Standardized Balance Assessment   Standardized Balance Assessment Berg Balance Test;Timed Up and Go Test     Berg Balance Test   Sit to Stand Able to stand  independently using hands   Standing Unsupported Able to stand safely 2 minutes   Sitting with Back Unsupported but Feet Supported on Floor or Stool Able to sit safely and securely 2 minutes   Stand to Sit Sits safely with minimal use of hands   Transfers Able to transfer safely, definite need of hands   Standing Unsupported with Eyes  Closed Able to stand 10 seconds safely   Standing Ubsupported with Feet Together Able to place feet together independently and stand 1 minute safely   From Standing, Reach Forward with Outstretched Arm Can reach forward >12 cm safely (5")   From Standing Position, Pick up Object from [place] to pick up shoe safely and easily   From Standing Position, Turn to Look Behind Over each Shoulder Looks behind from both sides and weight shifts well   Turn 360 Degrees Able to turn 360 degrees safely but slowly   Standing Unsupported, Alternately Place Feet on Step/Stool Able to complete 4 steps without aid or supervision    Standing Unsupported, One Foot in [place] to take small step independently and hold 30 seconds   Standing on One Leg Tries to lift leg/unable to hold 3 seconds but remains standing independently   Total Score 44     Timed Up and Go Test   TUG Normal TUG   Normal TUG (seconds) 15                     [facility] Adult PT Treatment/Exercise - 01/15/17 0001      Knee/Hip Exercises: Aerobic   Nustep level 3x 10 min ;seat #10, arms #11     Knee/Hip Exercises: Standing   Hip Abduction Stengthening;Both;2 sets;10 reps   Abduction Limitations 4#   Hip Extension Stengthening;Both;2 sets;10 reps   Extension Limitations 4#     Knee/Hip Exercises: Seated   Long Arc Quad Strengthening;Right;Left;2 sets;15 reps;Weights   Long Arc Quad Weight 4 lbs.   Marching Limitations 20   Marching Weights 4 lbs.                PT Education - 01/15/17 1430    Education provided Yes   Education Details osteo dos and donts, Associate Professor) Educated Patient   Methods Explanation;Demonstration;Handout   Comprehension Verbalized understanding          PT Short Term Goals - 01/01/17 1234      PT SHORT TERM GOAL #3   Title Berg balance score is >/= 45/56 due to improved strength   Time 4   Period Weeks   Status On-going     PT SHORT TERM GOAL #4   Title TUG score is </= 15 sec with no assistive device  due to improve fear of falls    Time 4   Period Weeks   Status On-going  18 sec           PT Long Term Goals - 01/15/17 1402      PT LONG TERM GOAL #1   Title independent with HEP and understands how to progress herself   Status Achieved     PT LONG TERM GOAL #2   Title walking 15 min. per day in a safe place with no hills using her cane to build her endurance   Status Partially Met     PT LONG TERM GOAL #3   Title Berg balance score is >/= 52/56 due to increased bilateral lower strength >/= 4/5    Status Partially Met     PT LONG TERM GOAL #4    Title TUG score is </= 13 seconds with no assistive device due to reduction in fear of falls   Baseline 15 seconds   Status Partially Met     PT LONG TERM GOAL #5   Title ability to go up and  down a curb and step with single point cane safely due to increased strength   Status Achieved               Plan - 01-25-17 1425    Clinical Impression Statement Pt requested to be discharged today due to being busy for sessions.  Pt has HEP in place for continued strength and endurance gains.  Pt reports that she has increased confidence with ascending curbs and is able to do now without fear.  Pt with continued gait instability and Berg Balance score is 44/56 indicating high falls risk.  Pt will continue with exercise and follow-up with MD as needed.     PT Next Visit Plan D/C PT to HEP   Consulted and Agree with Plan of Care Patient      Patient will benefit from skilled therapeutic intervention in order to improve the following deficits and impairments:     Visit Diagnosis: Muscle weakness (generalized)  Other abnormalities of gait and mobility       G-Codes - 2017-01-25 1443    Functional Assessment Tool Used (Outpatient Only) Berg and TUG   Functional Limitation Mobility: Walking and moving around   Mobility: Walking and Moving Around Goal Status [phone]) At least 20 percent but less than 40 percent impaired, limited or restricted   Mobility: Walking and Moving Around Discharge Status [phone]) At least 40 percent but less than 60 percent impaired, limited or restricted      Problem List Patient Active Problem List   Diagnosis Date Noted  . Bilateral lower extremity edema - mild 05/02/2013  . Cigarette smoker one half pack a day or less unmotivated he quit -  05/02/2013  . Chronic atrial fibrillation ([place]) 04/30/2013  . Essential hypertension 04/30/2013  . Long term (current) use of anticoagulants 10/03/2012  . CVA (cerebral vascular accident) ([place]) 10/03/2012  . H/O mitral  valve replacement 10/03/2012    PHYSICAL THERAPY DISCHARGE SUMMARY  Visits from Start of Care: 10  Current functional level related to goals / functional outcomes: See above for current status.  Pt requested D/C to HEP.     Remaining deficits: Chronic balance deficits.  Confidence has improved and pt has HEP in place.     Education / Equipment: HEP, balance/fall prevention, osteoporosis/body mechanics Plan: Patient agrees to discharge.  Patient goals were partially met. Patient is being discharged due to the patient's request.  ?????         Sigurd Sos, PT 01/25/2017 3:02 PM  [facility] [address], [place], [place], [address] Phone: [phone]   Fax:  [phone]  Name: Tina Wade MRN: [id number] Date of Birth: 02-Aug-1928

## 2017-01-15 NOTE — Patient Instructions (Addendum)
DO's and DON'T's   Avoid and/or Minimize positions of forward bending ( flexion)  Side bending and rotation of the trunk  Especially when movements occur together   When your back aches:   Don't sit down   Lie down on your back with a small pillow under your head and one under your knees or as outlined by our therapist. Or, lie in the 90/90 position ( on the floor with your feet and legs on the sofa with knees and hips bent to 90 degrees)  Tying or putting on your shoes:   Don't bend over to tie your shoes or put on socks.  Instead, bring one foot up, cross it over the opposite knee and bend forward (hinge) at the hips to so the task.  Keep your back straight.  If you cannot do this safely, then you need to use long handled assistive devices such as a shoehorn and sock puller.  Exercising:  Don't engage in ballistic types of exercise routines such as high-impact aerobics or jumping rope  Don't do exercises in the gym that bring you forward (abdominal crunches, sit-ups, touching your  toes, knee-to-chest, straight leg raising.)  Follow a regular exercise program that includes a variety of different weight-bearing activities, such as low-impact aerobics, T' ai chi or walking as your physical therapist advises  Do exercises that emphasize return to normal body alignment and strengthening of the muscles that keep your back straight, as outlined in this program or by your therapist  Household tasks:  Don't reach unnecessarily or twist your trunk when mopping, sweeping, vacuuming, raking, making beds, weeding gardens, getting objects ou of cupboards, etc.  Keep your broom, mop, vacuum, or rake close to you and mover your whole body as you move them. Walk over to the area on which you are working. Arrange kitchen, bathroom, and bedroom shelves so that frequently used items may be reached without excessive bending, twisting, and reaching.  Use a  sturdy stool if necessary.  Don't bend from the waist to pick up something up  Off the floor, out of the trunk of your car, or to brush your teeth, wash your face, etc.   Bend at the knees, keeping back straight as possible. Use a reacher if necessary.   Prevention of fracture is the so-called "BOTTOm -Line" in the management of OSTEOPOROSIS. Do not take unnecessary chances in movement. Once a compression fracture occurs, the process is very difficult to control; one fracture is frequently followed by many more.      Lifting Principles  .Maintain proper posture and head alignment. .Slide object as close as possible before lifting. .Move obstacles out of the way. .Test before lifting; ask for help if too heavy. .Tighten stomach muscles without holding breath. .Use smooth movements; do not jerk. .Use legs to do the work, and pivot with feet. .Distribute the work load symmetrically and close to the center of trunk. .Push instead of pull whenever possible.   Squat down and hold basket close to stand. Use leg muscles to do the work.    Avoid twisting or bending back. Pivot around using foot movements, and bend at knees if needed when reaching for articles.        Getting Into / Out of Bed   Lower self to lie down on one side by raising legs and lowering head at the same time. Use arms to assist moving without twisting. Bend both knees to roll onto back if desired. To sit up,  start from lying on side, and use same move-ments in reverse. Keep trunk aligned with legs.    Shift weight from front foot to back foot as item is lifted off shelf.    When leaning forward to pick object up from floor, extend one leg out behind. Keep back straight. Hold onto a sturdy support with other hand.      Sit upright, head facing forward. Try using a roll to support lower back. Keep shoulders relaxed, and avoid rounded back. Keep hips level with knees. Avoid crossing legs for long  periods.     [facility] [address] [place], [place] [address] Phone # [phone] Fax [phone]

## 2017-11-14 DIAGNOSIS — I5042 Chronic combined systolic (congestive) and diastolic (congestive) heart failure: Secondary | ICD-10-CM | POA: Insufficient documentation

## 2017-11-23 ENCOUNTER — Ambulatory Visit: Payer: No Typology Code available for payment source | Admitting: Family Medicine

## 2018-05-24 ENCOUNTER — Observation Stay (HOSPITAL_COMMUNITY)
Admission: EM | Admit: 2018-05-24 | Discharge: 2018-05-25 | Disposition: A | Discharge status: Home / Self Care | Payer: Medicare Other | Attending: Internal Medicine | Admitting: Internal Medicine

## 2018-05-24 ENCOUNTER — Emergency Department (HOSPITAL_COMMUNITY): Payer: Medicare Other

## 2018-05-24 ENCOUNTER — Encounter (HOSPITAL_COMMUNITY): Payer: Self-pay | Admitting: Emergency Medicine

## 2018-05-24 DIAGNOSIS — G459 Transient cerebral ischemic attack, unspecified: Secondary | ICD-10-CM | POA: Diagnosis not present

## 2018-05-24 DIAGNOSIS — R2681 Unsteadiness on feet: Secondary | ICD-10-CM | POA: Insufficient documentation

## 2018-05-24 DIAGNOSIS — E785 Hyperlipidemia, unspecified: Secondary | ICD-10-CM | POA: Insufficient documentation

## 2018-05-24 DIAGNOSIS — E039 Hypothyroidism, unspecified: Secondary | ICD-10-CM | POA: Insufficient documentation

## 2018-05-24 DIAGNOSIS — F17210 Nicotine dependence, cigarettes, uncomplicated: Secondary | ICD-10-CM | POA: Diagnosis not present

## 2018-05-24 DIAGNOSIS — R4781 Slurred speech: Secondary | ICD-10-CM | POA: Diagnosis present

## 2018-05-24 DIAGNOSIS — Z952 Presence of prosthetic heart valve: Secondary | ICD-10-CM | POA: Diagnosis not present

## 2018-05-24 DIAGNOSIS — Z7901 Long term (current) use of anticoagulants: Secondary | ICD-10-CM | POA: Insufficient documentation

## 2018-05-24 DIAGNOSIS — J45909 Unspecified asthma, uncomplicated: Secondary | ICD-10-CM

## 2018-05-24 DIAGNOSIS — Z8673 Personal history of transient ischemic attack (TIA), and cerebral infarction without residual deficits: Secondary | ICD-10-CM | POA: Diagnosis not present

## 2018-05-24 DIAGNOSIS — I482 Chronic atrial fibrillation, unspecified: Secondary | ICD-10-CM

## 2018-05-24 DIAGNOSIS — I10 Essential (primary) hypertension: Secondary | ICD-10-CM | POA: Diagnosis not present

## 2018-05-24 DIAGNOSIS — Z79899 Other long term (current) drug therapy: Secondary | ICD-10-CM | POA: Diagnosis not present

## 2018-05-24 DIAGNOSIS — Z720 Tobacco use: Secondary | ICD-10-CM

## 2018-05-24 LAB — CBC WITH DIFFERENTIAL/PLATELET
Abs Immature Granulocytes: 0.04 10*3/uL (ref 0.00–0.07)
Basophils Absolute: 0 10*3/uL (ref 0.0–0.1)
Basophils Relative: 1 %
EOS PCT: 2 %
Eosinophils Absolute: 0.1 10*3/uL (ref 0.0–0.5)
HEMATOCRIT: 42.6 % (ref 36.0–46.0)
HEMOGLOBIN: 13.4 g/dL (ref 12.0–15.0)
Immature Granulocytes: 1 %
LYMPHS ABS: 1.2 10*3/uL (ref 0.7–4.0)
LYMPHS PCT: 17 %
MCH: 30.5 pg (ref 26.0–34.0)
MCHC: 31.5 g/dL (ref 30.0–36.0)
MCV: 97 fL (ref 80.0–100.0)
MONO ABS: 0.7 10*3/uL (ref 0.1–1.0)
MONOS PCT: 9 %
NRBC: 0 % (ref 0.0–0.2)
Neutro Abs: 4.9 10*3/uL (ref 1.7–7.7)
Neutrophils Relative %: 70 %
Platelets: 160 10*3/uL (ref 150–400)
RBC: 4.39 MIL/uL (ref 3.87–5.11)
RDW: 16.3 % — AB (ref 11.5–15.5)
WBC: 7 10*3/uL (ref 4.0–10.5)

## 2018-05-24 LAB — PROTIME-INR
INR: 2.82
Prothrombin Time: 29.3 seconds — ABNORMAL HIGH (ref 11.4–15.2)

## 2018-05-24 LAB — COMPREHENSIVE METABOLIC PANEL
ALT: 22 U/L (ref 0–44)
AST: 45 U/L — AB (ref 15–41)
Albumin: 3.5 g/dL (ref 3.5–5.0)
Alkaline Phosphatase: 62 U/L (ref 38–126)
Anion gap: 8 (ref 5–15)
BUN: 21 mg/dL (ref 8–23)
CO2: 28 mmol/L (ref 22–32)
CREATININE: 0.81 mg/dL (ref 0.44–1.00)
Calcium: 9 mg/dL (ref 8.9–10.3)
Chloride: 100 mmol/L (ref 98–111)
Glucose, Bld: 120 mg/dL — ABNORMAL HIGH (ref 70–99)
Potassium: 4.2 mmol/L (ref 3.5–5.1)
SODIUM: 136 mmol/L (ref 135–145)
Total Bilirubin: 0.7 mg/dL (ref 0.3–1.2)
Total Protein: 7.4 g/dL (ref 6.5–8.1)

## 2018-05-24 MED ORDER — ASPIRIN EC 81 MG PO TBEC
81.0000 mg | DELAYED_RELEASE_TABLET | Freq: Every day | ORAL | Status: DC
  Administered 2018-05-25: 81 mg via ORAL
  Filled 2018-05-24: qty 1

## 2018-05-24 MED ORDER — SENNOSIDES-DOCUSATE SODIUM 8.6-50 MG PO TABS: 1.0000 | ORAL_TABLET | Freq: Every evening | ORAL | Status: DC | PRN

## 2018-05-24 MED ORDER — ACETAMINOPHEN 650 MG RE SUPP: 650.0000 mg | RECTAL | Status: DC | PRN

## 2018-05-24 MED ORDER — STROKE: EARLY STAGES OF RECOVERY BOOK
Freq: Once | Status: AC
  Administered 2018-05-24
  Filled 2018-05-24 (×2): qty 1

## 2018-05-24 MED ORDER — MONTELUKAST SODIUM 10 MG PO TABS
10.0000 mg | ORAL_TABLET | Freq: Every day | ORAL | Status: DC
  Administered 2018-05-24: 10 mg via ORAL

## 2018-05-24 MED ORDER — ACETAMINOPHEN 325 MG PO TABS: 650.0000 mg | ORAL_TABLET | ORAL | Status: DC | PRN

## 2018-05-24 MED ORDER — LEVOTHYROXINE SODIUM 100 MCG PO TABS
100.0000 ug | ORAL_TABLET | Freq: Every day | ORAL | Status: DC
  Administered 2018-05-25: 100 ug via ORAL
  Filled 2018-05-24: qty 1

## 2018-05-24 MED ORDER — UMECLIDINIUM BROMIDE 62.5 MCG/INH IN AEPB
1.0000 | INHALATION_SPRAY | Freq: Every day | RESPIRATORY_TRACT | Status: DC
  Administered 2018-05-25: 1 via RESPIRATORY_TRACT
  Filled 2018-05-24: qty 7

## 2018-05-24 MED ORDER — CALCIUM CARBONATE 1250 (500 CA) MG PO TABS
1250.0000 mg | ORAL_TABLET | Freq: Two times a day (BID) | ORAL | Status: DC
  Administered 2018-05-25 (×2): 1250 mg via ORAL
  Filled 2018-05-24 (×2): qty 1

## 2018-05-24 MED ORDER — ACETAMINOPHEN 160 MG/5ML PO SOLN: 650.0000 mg | ORAL | Status: DC | PRN

## 2018-05-24 MED ORDER — ARFORMOTEROL TARTRATE 15 MCG/2ML IN NEBU
15.0000 ug | INHALATION_SOLUTION | Freq: Two times a day (BID) | RESPIRATORY_TRACT | Status: DC
  Administered 2018-05-25: 15 ug via RESPIRATORY_TRACT
  Filled 2018-05-24: qty 2

## 2018-05-24 MED ORDER — ROSUVASTATIN CALCIUM 10 MG PO TABS
10.0000 mg | ORAL_TABLET | Freq: Every day | ORAL | Status: DC
  Administered 2018-05-25: 10 mg via ORAL
  Filled 2018-05-24: qty 2
  Filled 2018-05-24: qty 1

## 2018-05-24 NOTE — ED Provider Notes (Signed)
[facility] EMERGENCY DEPARTMENT Provider Note   CSN: [id number] Arrival date & time: 05/24/18  1946     History   Chief Complaint Chief Complaint  Patient presents with  . Stroke Like Symptoms    HPI Tina Wade is a 84 y.o. female.  84 year old female with prior medical history as detailed below presents for evaluation of transient strokelike symptoms.  Patient reports that she had transient right facial droop and slurred speech.  Symptoms started sometime around 3 PM.  She called EMS shortly thereafter.  EMS reports that her symptoms had resolved upon their arrival.  She is currently without complaint.  Her speech is normal.  She has no facial droop.  She denies focal weakness.  She denies any other associated symptoms.  She does report a prior history of TIA.  She is taking Coumadin for chronic A. fib.  She reports full compliance with her medications.  The history is provided by the patient and medical records.  Cerebrovascular Accident  This is a recurrent problem. The current episode started 3 to 5 hours ago. The problem occurs constantly. The problem has been resolved. Pertinent negatives include no chest pain, no abdominal pain and no headaches. Nothing aggravates the symptoms. Nothing relieves the symptoms. She has tried nothing for the symptoms.    Past Medical History:  Diagnosis Date  . Arrhythmia    chronic atrial fib. with controlled rate ,on warfarin therapy  . High cholesterol   . Hypertension   . Lower extremity edema    mild -stable  . Mitral valve stenosis 10/1994   s/p St. Jude mechanical valve placement -70M-101 MODEL  . Stroke ([place]) 08/2010   from office visit 2013- the setting of normal INR of 2.8 ;therefore increased goal to 3.5  . Thyroid disease    hypothyroidism on synthroid    Patient Active Problem List   Diagnosis Date Noted  . Bilateral lower extremity edema - mild 05/02/2013  . Cigarette smoker one half pack a day or  less unmotivated he quit -  05/02/2013  . Chronic atrial fibrillation 04/30/2013  . Essential hypertension 04/30/2013  . Long term (current) use of anticoagulants 10/03/2012  . CVA (cerebral vascular accident) ([place]) 10/03/2012  . H/O mitral valve replacement 10/03/2012    Past Surgical History:  Procedure Laterality Date  . CAROTID DOPPLER  04/25/2012   right ICA SMALL AMT FIBROUS PLAQUE;LEFT BULB SMALL TO MODERATE AMT OF MIXED DENSITY PLAQUE 50-69%; LEFT ICA 0-49%  . DOPPLER ECHOCARDIOGRAPHY  09/13/2011   mild concentric LVH ,EF 45-50%,mild to moderate dilated right ventricle ,moderately dilated left atrium,prosthetic vlve noemal geadients and normal functioning bileaflet valve.The aortic valve had only trace regurg. and was mildly sclerotic and the septal wall motion with post-op state makes the echo EF difficult to assess  . MITRAL VALVE SURGERY  10/1994  . NM MYOCAR PERF WALL MOTION  03/08/2004   done in [place]-----adenosine--normal study,no ischemia  . TRANSESOPHAGEAL ECHOCARDIOGRAM  08/2010   showed mild concentric LVH, EF 50-60% with moderate aortic regurg,calcified mitral annnulus with mechanical  prosthesis well functioning smoke inthe left atrial cavity noted;mod. to severe dilated. this when she was diagnosise with afib did not undergo cardioversion     OB History   None      Home Medications    Prior to Admission medications   Medication Sig Start Date End Date Taking? Authorizing Provider  aspirin 81 MG tablet Take 81 mg by mouth daily.  Yes [provider]  calcium carbonate (OS-CAL) 600 MG TABS tablet Take 600 mg by mouth 2 (two) times daily with a meal.   Yes [provider]  furosemide (LASIX) 40 MG tablet Take 40 mg by mouth daily.    Yes [provider]  levothyroxine (SYNTHROID, LEVOTHROID) 100 MCG tablet Take 100 mcg by mouth daily.    Yes [provider]  montelukast (SINGULAIR) 10 MG tablet Take 10 mg by mouth at bedtime.    Yes [provider]  POTASSIUM PO Take 1 tablet by mouth daily.   Yes [provider]  rosuvastatin (CRESTOR) 10 MG tablet Take 10 mg by mouth daily. 02/14/18  Yes [provider]  Tiotropium Bromide-Olodaterol 2.5-2.5 MCG/ACT AERS Inhale 2 puffs into the lungs daily. 03/07/17  Yes [provider]  warfarin (COUMADIN) 5 MG tablet TAKE ONE TO ONE AND ONE-HALF TABLETS DAILY OR AS DIRECTED Patient taking differently: Take 5-7.5 mg by mouth daily.    Yes Alvstad, Kristin L, RPH-CPP  losartan (COZAAR) 25 MG tablet Take 1 tablet (25 mg total) by mouth daily. Patient not taking: Reported on 12/04/2016 03/27/14   Leonie Man, MD    Family History Family History  Problem Relation Age of Onset  . Heart disease Mother   . Tuberculosis Father     Social History Social History   Tobacco Use  . Smoking status: Current Every Day Smoker    Packs/day: 0.25    Types: Cigarettes  . Smokeless tobacco: Never Used  Substance Use Topics  . Alcohol use: No  . Drug use: No     Allergies   Patient has no known allergies.   Review of Systems Review of Systems  Cardiovascular: Negative for chest pain.  Gastrointestinal: Negative for abdominal pain.  Neurological: Negative for headaches.  All other systems reviewed and are negative.    Physical Exam Updated Vital Signs BP (!) 128/95   Pulse (!) 57   Temp 98.3 F (36.8 C) (Oral)   Resp 18   Ht 5\' 5"  (1.651 m)   Wt 77.1 kg   SpO2 94%   BMI 28.29 kg/m   Physical Exam  Constitutional: She is oriented to person, place, and time. She appears well-developed and well-nourished. No distress.  HENT:  Head: Normocephalic and atraumatic.  Mouth/Throat: Oropharynx is clear and moist.  Eyes: Pupils are equal, round, and reactive to light. Conjunctivae and EOM are normal.  Neck: Normal range of motion. Neck supple.  Cardiovascular: Normal rate, regular rhythm and normal heart sounds.  Pulmonary/Chest: Effort  normal and breath sounds normal. No respiratory distress.  Abdominal: Soft. She exhibits no distension. There is no tenderness.  Musculoskeletal: Normal range of motion. She exhibits no edema or deformity.  Neurological: She is alert and oriented to person, place, and time. She displays normal reflexes. No cranial nerve deficit or sensory deficit. She exhibits normal muscle tone. Coordination normal.  Alert and oriented x4 Normal speech No facial droop VAN negative 5 out of 5 strength in all 4 extremities  Skin: Skin is warm and dry.  Psychiatric: She has a normal mood and affect.  Nursing note and vitals reviewed.    ED Treatments / Results  Labs (all labs ordered are listed, but only abnormal results are displayed) Labs Reviewed  COMPREHENSIVE METABOLIC PANEL - Abnormal; Notable for the following components:      Result Value   Glucose, Bld 120 (*)    AST 45 (*)  All other components within normal limits  CBC WITH DIFFERENTIAL/PLATELET - Abnormal; Notable for the following components:   RDW 16.3 (*)    All other components within normal limits  PROTIME-INR - Abnormal; Notable for the following components:   Prothrombin Time 29.3 (*)    All other components within normal limits  URINALYSIS, ROUTINE W REFLEX MICROSCOPIC  CBG MONITORING, ED    EKG EKG Interpretation  Date/Time:  Friday May 24 2018 19:56:02 EST Ventricular Rate:  80 PR Interval:    QRS Duration: 100 QT Interval:  414 QTC Calculation: 478 R Axis:   -17 Text Interpretation:  Atrial fibrillation Borderline left axis deviation RSR' in V1 or V2, probably normal variant Minimal ST depression, lateral leads Confirmed by Dene Gentry [phone]) on 05/24/2018 8:00:44 PM   Radiology Ct Head Wo Contrast  Result Date: 05/24/2018 CLINICAL DATA:  Right-sided weakness and aphasia starting tonight. EXAM: CT HEAD WITHOUT CONTRAST TECHNIQUE: Contiguous axial images were obtained from the base of the skull through  the vertex without intravenous contrast. COMPARISON:  09/05/2010 FINDINGS: Brain: Remote small upper cerebellar lacunar infarcts. Remote small left anterior thalamic lacunar infarct. Small remote lacunar infarcts of the head of left caudate nucleus. Lucencies along the inferior margin of the lentiform nuclei, probably from dilated perivascular spaces. Remote right frontal infarct, image 23/3. No intracranial hemorrhage or acute intracranial findings. Periventricular white matter and corona radiata hypodensities favor chronic ischemic microvascular white matter disease. Vascular: There is atherosclerotic calcification of the cavernous carotid arteries bilaterally. Skull: Unremarkable Sinuses/Orbits: Chronic right maxillary and left ethmoid sinusitis. 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 Other: No supplemental non-categorized findings. IMPRESSION: 1. No acute intracranial findings. 2. Remote cerebellar, basal ganglia, and left anterior thalamic lacunar infarcts. Old right frontal infarct. 3. 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 4. Periventricular white matter and corona radiata hypodensities favor chronic ischemic microvascular white matter disease. 5. Atherosclerosis. 6. Mild chronic maxillary and ethmoid sinusitis. Electronically Signed   By: Van Clines M.D.   On: 05/24/2018 20:40    Procedures Procedures (including critical care time)  Medications Ordered in ED Medications - No data to display   Initial Impression / Assessment and Plan / ED Course  I have reviewed the triage vital signs and the nursing notes.  Pertinent labs & imaging results that were available during my care of the patient were reviewed by me and considered in my medical decision making (see chart for details).     MDM  Screen complete  Patient is presenting for symptoms consistent with likely TIA.  Symptoms have resolved completely upon  arrival to the ED.  She does not meet criteria for TPA given complete resolution of symptoms.  Case discussed with Dr. Cheral Marker of neurology.  He feels that the patient should be admitted to the hospitalist service for further TIA work-up.  Hospitalist service contacted and will evaluate the patient for admission.  Final Clinical Impressions(s) / ED Diagnoses   Final diagnoses:  TIA (transient ischemic attack)    ED Discharge Orders    None       Valarie Merino, MD 05/24/18 2218

## 2018-05-24 NOTE — Consult Note (Signed)
Referring Physician: Dr. Marlowe Sax    Chief Complaint: TIA  HPI: Tina Wade is an 84 y.o. female who presented to the ED on Saturday evening with transient slurred speech and right facial droop. Her symptoms began approximately at 3 PM and she called EMS soon afterwards. On EMS arrival, her symptoms had resolved. Initial exam by EDP revealed no deficits. She has chronic atrial fibrillation as well as a mechanical heart valve (the valve is MRI compatible per patient). She is on warfarin with INR checks qmonth which have been stable. She states that the INR goal for her mechanical valve is 3-4. She also takes ASA but is unsure why this has been prescribed to her in conjunction with warfarin. She denies a history of carotid or cardiac stenting. Her medications also include rosuvastatin.   Her PMHx also includes hypercholesterolemia, HTN, hypothyroidism, right ICA atherosclerotic narrowing at 50-69% and left at 0-49% based on carotid ultrasound in 2013, as well as a stroke in 2013. She smokes 1/4 ppd.   Past Medical History:  Diagnosis Date  . Arrhythmia    chronic atrial fib. with controlled rate ,on warfarin therapy  . High cholesterol   . Hypertension   . Lower extremity edema    mild -stable  . Mitral valve stenosis 10/1994   s/p St. Jude mechanical valve placement -19M-101 MODEL  . Stroke ([place]) 08/2010   from office visit 2013- the setting of normal INR of 2.8 ;therefore increased goal to 3.5  . Thyroid disease    hypothyroidism on synthroid    Past Surgical History:  Procedure Laterality Date  . CAROTID DOPPLER  04/25/2012   right ICA SMALL AMT FIBROUS PLAQUE;LEFT BULB SMALL TO MODERATE AMT OF MIXED DENSITY PLAQUE 50-69%; LEFT ICA 0-49%  . DOPPLER ECHOCARDIOGRAPHY  09/13/2011   mild concentric LVH ,EF 45-50%,mild to moderate dilated right ventricle ,moderately dilated left atrium,prosthetic vlve noemal geadients and normal functioning bileaflet valve.The aortic valve had only trace  regurg. and was mildly sclerotic and the septal wall motion with post-op state makes the echo EF difficult to assess  . MITRAL VALVE SURGERY  10/1994  . NM MYOCAR PERF WALL MOTION  03/08/2004   done in [place]-----adenosine--normal study,no ischemia  . TRANSESOPHAGEAL ECHOCARDIOGRAM  08/2010   showed mild concentric LVH, EF 50-60% with moderate aortic regurg,calcified mitral annnulus with mechanical  prosthesis well functioning smoke inthe left atrial cavity noted;mod. to severe dilated. this when she was diagnosise with afib did not undergo cardioversion    Family History  Problem Relation Age of Onset  . Heart disease Mother   . Tuberculosis Father    Social History:  reports that she has been smoking cigarettes. She has been smoking about 0.25 packs per day. She has never used smokeless tobacco. She reports that she does not drink alcohol or use drugs.  Allergies: No Known Allergies  Home Medications:    ROS: States all neurological symptoms have resolved. No headache, vision loss, trouble talking, confusion, neck pain, chest pain, abdominal pain or limb pain.   Physical Examination: Blood pressure (!) 128/95, pulse (!) 57, temperature 98.3 F (36.8 C), temperature source Oral, resp. rate 18, height 5' 5"  (1.651 m), weight 77.1 kg, SpO2 94 %.  HEENT: [place]/AT Lungs: Respirations unlabored Ext: Warm and well perfused  Neurologic Examination: Mental Status: Alert, fully oriented, thought content appropriate.  Speech fluent with intact naming and comprehension. Able to follow a 3 step directional command without difficulty. Cranial Nerves: II:  Visual fields  intact with no extinction to DSS. PERRL.  III,IV, VI: EOMI without nystagmus. No ptosis.  V,VII: No facial droop. Temp sensation equal bilaterally.  VIII: Hearing intact to voice IX,X: Palate rises symmetrically XI: Symmetric XII: midline tongue extension  Motor: Right : Upper extremity   5/5    Left:     Upper extremity    5/5  Lower extremity   5/5     Lower extremity   5/5 Normal tone throughout; no atrophy noted Sensory: Temp and light touch intact BUE. Decreased temp sensation BLE distally. FT intact BLE with no extinction to DSS.  Deep Tendon Reflexes:  2+ bilateral upper extremities. 1+ patellae, 0 achilles bilaterally.  Plantars: Tonically upgoing toes (cortical toes) Cerebellar: No ataxia with FNF bilaterally. Fine action tremor is present in upper extremities bilaterally.  Gait: Deferred   Results for orders placed or performed during the hospital encounter of 05/24/18 (from the past 48 hour(s))  Comprehensive metabolic panel     Status: Abnormal   Collection Time: 05/24/18  8:00 PM  Result Value Ref Range   Sodium 136 135 - 145 mmol/L   Potassium 4.2 3.5 - 5.1 mmol/L   Chloride 100 98 - 111 mmol/L   CO2 28 22 - 32 mmol/L   Glucose, Bld 120 (H) 70 - 99 mg/dL   BUN 21 8 - 23 mg/dL   Creatinine, Ser 0.81 0.44 - 1.00 mg/dL   Calcium 9.0 8.9 - 10.3 mg/dL   Total Protein 7.4 6.5 - 8.1 g/dL   Albumin 3.5 3.5 - 5.0 g/dL   AST 45 (H) 15 - 41 U/L   ALT 22 0 - 44 U/L   Alkaline Phosphatase 62 38 - 126 U/L   Total Bilirubin 0.7 0.3 - 1.2 mg/dL   GFR calc non Af Amer >60 >60 mL/min   GFR calc Af Amer >60 >60 mL/min    Comment: (NOTE) The eGFR has been calculated using the CKD EPI equation. This calculation has not been validated in all clinical situations. eGFR's persistently <60 mL/min signify possible Chronic Kidney Disease.    Anion gap 8 5 - 15    Comment: Performed at [facility] [address], [place], [place] [address]  CBC with Differential     Status: Abnormal   Collection Time: 05/24/18  8:00 PM  Result Value Ref Range   WBC 7.0 4.0 - 10.5 K/uL   RBC 4.39 3.87 - 5.11 MIL/uL   Hemoglobin 13.4 12.0 - 15.0 g/dL   HCT 42.6 36.0 - 46.0 %   MCV 97.0 80.0 - 100.0 fL   MCH 30.5 26.0 - 34.0 pg   MCHC 31.5 30.0 - 36.0 g/dL   RDW 16.3 (H) 11.5 - 15.5 %   Platelets 160 150 - 400  K/uL   nRBC 0.0 0.0 - 0.2 %   Neutrophils Relative % 70 %   Neutro Abs 4.9 1.7 - 7.7 K/uL   Lymphocytes Relative 17 %   Lymphs Abs 1.2 0.7 - 4.0 K/uL   Monocytes Relative 9 %   Monocytes Absolute 0.7 0.1 - 1.0 K/uL   Eosinophils Relative 2 %   Eosinophils Absolute 0.1 0.0 - 0.5 K/uL   Basophils Relative 1 %   Basophils Absolute 0.0 0.0 - 0.1 K/uL   Immature Granulocytes 1 %   Abs Immature Granulocytes 0.04 0.00 - 0.07 K/uL    Comment: Performed at [facility] [address] [place], [place] [address]  Protime-INR  Status: Abnormal   Collection Time: 05/24/18  8:00 PM  Result Value Ref Range   Prothrombin Time 29.3 (H) 11.4 - 15.2 seconds   INR 2.82     Comment: Performed at [facility], [place] [address] [place], [place] [address]   Ct Head Wo Contrast  Result Date: 05/24/2018 CLINICAL DATA:  Right-sided weakness and aphasia starting tonight. EXAM: CT HEAD WITHOUT CONTRAST TECHNIQUE: Contiguous axial images were obtained from the base of the skull through the vertex without intravenous contrast. COMPARISON:  09/05/2010 FINDINGS: Brain: Remote small upper cerebellar lacunar infarcts. Remote small left anterior thalamic lacunar infarct. Small remote lacunar infarcts of the head of left caudate nucleus. Lucencies along the inferior margin of the lentiform nuclei, probably from dilated perivascular spaces. Remote right frontal infarct, image 23/3. No intracranial hemorrhage or acute intracranial findings. Periventricular white matter and corona radiata hypodensities favor chronic ischemic microvascular white matter disease. Vascular: There is atherosclerotic calcification of the cavernous carotid arteries bilaterally. Skull: Unremarkable Sinuses/Orbits: Chronic right maxillary and left ethmoid sinusitis. 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 Other: No supplemental non-categorized findings. IMPRESSION: 1. No acute intracranial findings. 2.  Remote cerebellar, basal ganglia, and left anterior thalamic lacunar infarcts. Old right frontal infarct. 3. 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 4. Periventricular white matter and corona radiata hypodensities favor chronic ischemic microvascular white matter disease. 5. Atherosclerosis. 6. Mild chronic maxillary and ethmoid sinusitis. Electronically Signed   By: Van Clines M.D.   On: 05/24/2018 20:40    Assessment: 84 y.o. female presenting with TIA 1. Exam is normal except for impaired temperature sensation to distal BLE.  2. CT shows no acute intracranial findings. Remote cerebellar, basal ganglia, and left anterior thalamic lacunar infarcts as well as an old right frontal infarct are noted. Periventricular white matter and corona radiata hypodensities favor chronic ischemic microvascular white matter disease.  3. Stroke Risk Factors - mechanical heart valve, atrial fibrillation, hypercholesterolemia, HTN, prior stroke, carotid atherosclerotic disease and smoking.   Plan: 1. HgbA1c, fasting lipid panel 2. MRI, MRA of the brain without contrast 3. PT consult, OT consult, Speech consult 4. TTE 5. Carotid dopplers 6. Prophylactic therapy- Continue warfarin with INR goal of 3-4 for mechanical heart valve, which also serves as prophylaxis against recurrent stroke in the setting of her atrial fibrillation. 7. Would contact her PCP to determine the indication for her ASA  8. Continue rosuvastatin 9. Modified permissive HTN protocol given her advanced age with SBP goal of < 180 for the next 24-48 hours 10. Risk factor modification. Encourage smoking cessation 11. Telemetry monitoring 12. Frequent neuro checks  @Electronically  signed: Dr. Kerney Elbe 05/24/2018, 10:20 PM

## 2018-05-24 NOTE — H&P (Signed)
History and Physical    Tina Wade ASN:[id number] DOB: 1929/05/21 DOA: 05/24/2018  PCP: System, Pcp Not In Patient coming from: Home  Chief Complaint: Right facial droop, slurred speech  HPI: Tina Wade is a 84 y.o. female with medical history significant of prior stroke, TIA, hypertension, hyperlipidemia, chronic A. fib on Coumadin, mechanical mitral valve hypothyroidism presenting to the hospital for evaluation of right-sided facial droop and slurring of speech.  Symptoms had resolved when EMS arrived. Patient states at 6 PM today she started experiencing left-sided facial droop, trembling of both of her hands, and slurring of speech.  Symptoms resolved by 7:30 PM.  Denies having any focal weakness.  States she had TIAs in the past and a stroke 5 years ago but does not have any residual deficits.  She has been smoking cigarettes for the past 62 years and continues to smoke 1/2 pack/day.  ED Course: CT head showing remote cerebellar, basal ganglia, and left anterior thalamic lacunar infarcts, old right frontal infarct, chronic ischemic microvascular white matter disease, and no acute intracranial findings.  ED physician discussed the case with Dr. Cheral Marker from neurology who recommended TIA work-up.  Neuro will see the patient.  Review of Systems: As per HPI otherwise 10 point review of systems negative.  Past Medical History:  Diagnosis Date  . Arrhythmia    chronic atrial fib. with controlled rate ,on warfarin therapy  . High cholesterol   . Hypertension   . Lower extremity edema    mild -stable  . Mitral valve stenosis 10/1994   s/p St. Jude mechanical valve placement -1M-101 MODEL  . Stroke ([place]) 08/2010   from office visit 2013- the setting of normal INR of 2.8 ;therefore increased goal to 3.5  . Thyroid disease    hypothyroidism on synthroid    Past Surgical History:  Procedure Laterality Date  . CAROTID DOPPLER  04/25/2012   right ICA SMALL AMT FIBROUS  PLAQUE;LEFT BULB SMALL TO MODERATE AMT OF MIXED DENSITY PLAQUE 50-69%; LEFT ICA 0-49%  . DOPPLER ECHOCARDIOGRAPHY  09/13/2011   mild concentric LVH ,EF 45-50%,mild to moderate dilated right ventricle ,moderately dilated left atrium,prosthetic vlve noemal geadients and normal functioning bileaflet valve.The aortic valve had only trace regurg. and was mildly sclerotic and the septal wall motion with post-op state makes the echo EF difficult to assess  . MITRAL VALVE SURGERY  10/1994  . NM MYOCAR PERF WALL MOTION  03/08/2004   done in [place]-----adenosine--normal study,no ischemia  . TRANSESOPHAGEAL ECHOCARDIOGRAM  08/2010   showed mild concentric LVH, EF 50-60% with moderate aortic regurg,calcified mitral annnulus with mechanical  prosthesis well functioning smoke inthe left atrial cavity noted;mod. to severe dilated. this when she was diagnosise with afib did not undergo cardioversion     reports that she has been smoking cigarettes. She has been smoking about 0.25 packs per day. She has never used smokeless tobacco. She reports that she does not drink alcohol or use drugs.  No Known Allergies  Family History  Problem Relation Age of Onset  . Heart disease Mother   . Tuberculosis Father     Prior to Admission medications   Medication Sig Start Date End Date Taking? Authorizing Provider  aspirin 81 MG tablet Take 81 mg by mouth daily.     Yes [provider]  calcium carbonate (OS-CAL) 600 MG TABS tablet Take 600 mg by mouth 2 (two) times daily with a meal.   Yes [provider]  furosemide (LASIX) 40  MG tablet Take 40 mg by mouth daily.    Yes [provider]  levothyroxine (SYNTHROID, LEVOTHROID) 100 MCG tablet Take 100 mcg by mouth daily.    Yes [provider]  montelukast (SINGULAIR) 10 MG tablet Take 10 mg by mouth at bedtime.   Yes [provider]  POTASSIUM PO Take 1 tablet by mouth daily.   Yes [provider]  rosuvastatin  (CRESTOR) 10 MG tablet Take 10 mg by mouth daily. 02/14/18  Yes [provider]  Tiotropium Bromide-Olodaterol 2.5-2.5 MCG/ACT AERS Inhale 2 puffs into the lungs daily. 03/07/17  Yes [provider]  warfarin (COUMADIN) 5 MG tablet TAKE ONE TO ONE AND ONE-HALF TABLETS DAILY OR AS DIRECTED Patient taking differently: Take 5-7.5 mg by mouth daily.    Yes Alvstad, Kristin L, RPH-CPP  losartan (COZAAR) 25 MG tablet Take 1 tablet (25 mg total) by mouth daily. Patient not taking: Reported on 12/04/2016 03/27/14   Leonie Man, MD    Physical Exam: Vitals:   05/24/18 2015 05/24/18 2045 05/24/18 2200 05/24/18 2230  BP: (!) 148/57 (!) 162/61 (!) 128/95 (!) 127/97  Pulse: (!) 57 (!) 56 (!) 57 76  Resp: (!) 26 17 18  (!) 22  Temp:      TempSrc:      SpO2: 95% 97% 94% 92%  Weight:      Height:        Physical Exam  Constitutional: She is oriented to person, place, and time. She appears well-developed and well-nourished. No distress.  Resting comfortably in a hospital stretcher talking to family  HENT:  Head: Normocephalic and atraumatic.  Mouth/Throat: Oropharynx is clear and moist.  Eyes: Pupils are equal, round, and reactive to light. EOM are normal. Right eye exhibits no discharge. Left eye exhibits no discharge.  Neck: Neck supple. No tracheal deviation present.  Cardiovascular: Normal rate and intact distal pulses.  Irregularly irregular rhythm  Pulmonary/Chest: Effort normal and breath sounds normal. No respiratory distress. She has no wheezes. She has no rales.  Abdominal: Soft. Bowel sounds are normal. She exhibits no distension. There is no tenderness.  Musculoskeletal: She exhibits no edema.  Neurological: She is alert and oriented to person, place, and time.  No slurring of speech Tongue midline No facial droop Strength 5 out of 5 in bilateral upper and lower extremities Sensation to light touch intact throughout  Skin: Skin is warm and dry. She is not  diaphoretic.  Psychiatric: She has a normal mood and affect. Her behavior is normal.     Labs on Admission: I have personally reviewed following labs and imaging studies  CBC: Recent Labs  Lab 05/24/18 2000  WBC 7.0  NEUTROABS 4.9  HGB 13.4  HCT 42.6  MCV 97.0  PLT 956   Basic Metabolic Panel: Recent Labs  Lab 05/24/18 2000  NA 136  K 4.2  CL 100  CO2 28  GLUCOSE 120*  BUN 21  CREATININE 0.81  CALCIUM 9.0   GFR: Estimated Creatinine Clearance: 50.2 mL/min (by C-G formula based on SCr of 0.81 mg/dL). Liver Function Tests: Recent Labs  Lab 05/24/18 2000  AST 45*  ALT 22  ALKPHOS 62  BILITOT 0.7  PROT 7.4  ALBUMIN 3.5   No results for input(s): LIPASE, AMYLASE in the last 168 hours. No results for input(s): AMMONIA in the last 168 hours. Coagulation Profile: Recent Labs  Lab 05/24/18 2000  INR 2.82   Cardiac Enzymes: No results for input(s): CKTOTAL, CKMB,  CKMBINDEX, TROPONINI in the last 168 hours. BNP (last 3 results) No results for input(s): PROBNP in the last 8760 hours. HbA1C: No results for input(s): HGBA1C in the last 72 hours. CBG: No results for input(s): GLUCAP in the last 168 hours. Lipid Profile: No results for input(s): CHOL, HDL, LDLCALC, TRIG, CHOLHDL, LDLDIRECT in the last 72 hours. Thyroid Function Tests: No results for input(s): TSH, T4TOTAL, FREET4, T3FREE, THYROIDAB in the last 72 hours. Anemia Panel: No results for input(s): VITAMINB12, FOLATE, FERRITIN, TIBC, IRON, RETICCTPCT in the last 72 hours. Urine analysis:    Component Value Date/Time   COLORURINE YELLOW 09/06/2010 0057   APPEARANCEUR CLOUDY (A) 09/06/2010 0057   LABSPEC 1.020 09/06/2010 0057   PHURINE 7.0 09/06/2010 0057   GLUCOSEU NEGATIVE 11/06/2009 1300   HGBUR NEGATIVE 09/06/2010 0057   BILIRUBINUR NEGATIVE 09/06/2010 0057   KETONESUR NEGATIVE 09/06/2010 0057   PROTEINUR NEGATIVE 09/06/2010 0057   UROBILINOGEN 0.2 09/06/2010 0057   NITRITE NEGATIVE  09/06/2010 0057   LEUKOCYTESUR NEGATIVE 09/06/2010 0057    Radiological Exams on Admission: Ct Head Wo Contrast  Result Date: 05/24/2018 CLINICAL DATA:  Right-sided weakness and aphasia starting tonight. EXAM: CT HEAD WITHOUT CONTRAST TECHNIQUE: Contiguous axial images were obtained from the base of the skull through the vertex without intravenous contrast. COMPARISON:  09/05/2010 FINDINGS: Brain: Remote small upper cerebellar lacunar infarcts. Remote small left anterior thalamic lacunar infarct. Small remote lacunar infarcts of the head of left caudate nucleus. Lucencies along the inferior margin of the lentiform nuclei, probably from dilated perivascular spaces. Remote right frontal infarct, image 23/3. No intracranial hemorrhage or acute intracranial findings. Periventricular white matter and corona radiata hypodensities favor chronic ischemic microvascular white matter disease. Vascular: There is atherosclerotic calcification of the cavernous carotid arteries bilaterally. Skull: Unremarkable Sinuses/Orbits: Chronic right maxillary and left ethmoid sinusitis. 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 Other: No supplemental non-categorized findings. IMPRESSION: 1. No acute intracranial findings. 2. Remote cerebellar, basal ganglia, and left anterior thalamic lacunar infarcts. Old right frontal infarct. 3. 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 4. Periventricular white matter and corona radiata hypodensities favor chronic ischemic microvascular white matter disease. 5. Atherosclerosis. 6. Mild chronic maxillary and ethmoid sinusitis. Electronically Signed   By: Van Clines M.D.   On: 05/24/2018 20:40    EKG: Independently reviewed.  A. fib-heart rate 80.  Assessment/Plan Principal Problem:   TIA (transient ischemic attack) Active Problems:   H/O mitral valve replacement   Chronic atrial fibrillation   Essential  hypertension   HLD (hyperlipidemia)   Tobacco use   Reactive airway disease   TIA Experienced acute onset left-sided facial droop, trembling of hands, and slurring of speech today.  Symptoms resolved within 90 minutes.  Currently she does not have any neuro deficits. CT head showing remote cerebellar, basal ganglia, and left anterior thalamic lacunar infarcts, old right frontal infarct, chronic ischemic microvascular white matter disease, and no acute intracranial findings.  ED physician discussed the case with Dr. Cheral Marker from neurology who recommended TIA work-up.  Neuro will see the patient. -Continue aspirin -HgbA1c, fasting lipid panel -MRI, MRA  of the brain without contrast -PT consult, OT consult, Speech consult -Echocardiogram -Carotid dopplers -Risk factor modification -Telemetry monitoring -Frequent neuro checks  Chronic A. Fib, mechanical mitral valve  -CHA2DS2VASc 6.  Not on any rate control agent.  Currently rate controlled. -On Coumadin.  INR 2.8 today.  -Coumadin dosing per pharmacy  Hypertension -Currently normotensive. -Does  not appear volume overloaded.  Hold home Lasix at this time.  Hypothyroidism -Continue home Synthroid  Hyperlipidemia -Continue home Crestor -Check lipid panel  Tobacco use Patient has been smoking for the past 62 years and continues to smoke 1/2 pack/day.  I discussed smoking cessation but she appears to be in the pre-contemplative stage at this time.  She is declining nicotine patch use in the hospital.  Reactive airway disease -Stable.  No bronchospasm.  Continue home inhalers and Singulair.  DVT prophylaxis: Coumadin Code Status: Patient wishes to be DNR. Family Communication: Son at bedside. Disposition Plan: Anticipate discharge to home in 1 to 2 days. Consults called: Neurology-Dr. Cheral Marker Admission status: Observation   Shela Leff MD [facility] Pager [phone]  If 7PM-7AM, please contact  night-coverage [email or web address] Password [email or web address]  05/24/2018, 11:10 PM

## 2018-05-24 NOTE — ED Notes (Signed)
BIB EMS from home, per family pt was LSN 1700, upon arrival back home approx 1900 pt noted to have R sided weakness and asphasia. Upon arrival to ED pt is completely back to her norm. No neuro deficits noted. EDP at bedside.

## 2018-05-24 NOTE — Progress Notes (Signed)
ANTICOAGULATION CONSULT NOTE - Initial Consult  Pharmacy Consult for Warfarin  Indication: Mechanical heart valve, atrial fibrillation, hx CVA  No Known Allergies  Patient Measurements: Height: 5\' 5"  (165.1 cm) Weight: 170 lb (77.1 kg) IBW/kg (Calculated) : 57  Vital Signs: Temp: 98.3 F (36.8 C) (11/08 1954) Temp Source: Oral (11/08 1954) BP: 127/97 (11/08 2230) Pulse Rate: 76 (11/08 2230)  Labs: Recent Labs    05/24/18 2000  HGB 13.4  HCT 42.6  PLT 160  LABPROT 29.3*  INR 2.82  CREATININE 0.81    Estimated Creatinine Clearance: 50.2 mL/min (by C-G formula based on SCr of 0.81 mg/dL).   Medical History: Past Medical History:  Diagnosis Date  . Arrhythmia    chronic atrial fib. with controlled rate ,on warfarin therapy  . High cholesterol   . Hypertension   . Lower extremity edema    mild -stable  . Mitral valve stenosis 10/1994   s/p St. Jude mechanical valve placement -9M-101 MODEL  . Stroke ([place]) 08/2010   from office visit 2013- the setting of normal INR of 2.8 ;therefore increased goal to 3.5  . Thyroid disease    hypothyroidism on synthroid     Assessment: 84 y/o F on warfarin for multiple indications, pt presents to the ED with likely TIA, CT with no bleeding  INR is 2.82 (INR goal per outpatient anti-coag notes is 3-4?)  Outpatient anti-coag notes are not specific regarding dosing as well as the med rec  Last dose of warfarin was 11/8 AM  Goal of Therapy:  ?INR 3-4 Monitor platelets by anticoagulation protocol: Yes   Plan:  -INR with AM labs to assess dosing needs -F/U warfarin dosing regimen  Narda Bonds 05/24/2018,11:03 PM

## 2018-05-25 ENCOUNTER — Observation Stay (HOSPITAL_BASED_OUTPATIENT_CLINIC_OR_DEPARTMENT_OTHER): Payer: Medicare Other

## 2018-05-25 ENCOUNTER — Encounter (HOSPITAL_COMMUNITY): Payer: Self-pay | Admitting: Internal Medicine

## 2018-05-25 ENCOUNTER — Observation Stay (HOSPITAL_COMMUNITY): Payer: Medicare Other

## 2018-05-25 ENCOUNTER — Other Ambulatory Visit: Payer: Self-pay

## 2018-05-25 DIAGNOSIS — I10 Essential (primary) hypertension: Secondary | ICD-10-CM

## 2018-05-25 DIAGNOSIS — G459 Transient cerebral ischemic attack, unspecified: Secondary | ICD-10-CM

## 2018-05-25 DIAGNOSIS — I361 Nonrheumatic tricuspid (valve) insufficiency: Secondary | ICD-10-CM | POA: Diagnosis not present

## 2018-05-25 LAB — ECHOCARDIOGRAM COMPLETE
Height: 65 in
Weight: 2720 oz

## 2018-05-25 LAB — LIPID PANEL
Cholesterol: 133 mg/dL (ref 0–200)
HDL: 54 mg/dL (ref >40)
LDL Cholesterol: 69 mg/dL (ref 0–99)
TRIGLYCERIDES: 51 mg/dL (ref <150)
Total CHOL/HDL Ratio: 2.5 RATIO
VLDL: 10 mg/dL (ref 0–40)

## 2018-05-25 LAB — HEMOGLOBIN A1C
HEMOGLOBIN A1C: 5 % (ref 4.8–5.6)
Mean Plasma Glucose: 96.8 mg/dL

## 2018-05-25 LAB — PROTIME-INR
INR: 2.61
PROTHROMBIN TIME: 27.6 s — AB (ref 11.4–15.2)

## 2018-05-25 MED ORDER — WARFARIN - PHARMACIST DOSING INPATIENT: Freq: Every day | Status: DC

## 2018-05-25 MED ORDER — ENOXAPARIN SODIUM 80 MG/0.8ML ~~LOC~~ SOLN
1.0000 mg/kg | Freq: Two times a day (BID) | SUBCUTANEOUS | Status: DC
  Administered 2018-05-25: 75 mg via SUBCUTANEOUS
  Filled 2018-05-25: qty 0.8

## 2018-05-25 MED ORDER — WARFARIN SODIUM 5 MG PO TABS
5.0000 mg | ORAL_TABLET | Freq: Once | ORAL | Status: AC
  Administered 2018-05-25: 5 mg via ORAL
  Filled 2018-05-25: qty 1

## 2018-05-25 MED ORDER — ENOXAPARIN SODIUM 80 MG/0.8ML ~~LOC~~ SOLN: 1.0000 mg/kg | Freq: Two times a day (BID) | SUBCUTANEOUS | 0 refills | Status: DC

## 2018-05-25 NOTE — Progress Notes (Signed)
Patient is discharging home with daughter. All belongings sent with patient. Discharge paperwork went over with pt and daughter. All questions and concerns addressed. IV has been taken out.

## 2018-05-25 NOTE — Progress Notes (Signed)
ANTICOAGULATION CONSULT NOTE - Follow-up Consult  Pharmacy Consult for lovenox Indication: Mechanical heart valve, atrial fibrillation, hx CVA  No Known Allergies  Patient Measurements: Height: 5\' 5"  (165.1 cm) Weight: 170 lb (77.1 kg) IBW/kg (Calculated) : 57  Vital Signs: Temp: 99.2 F (37.3 C) (11/09 0741) Temp Source: Oral (11/09 0741) BP: (P) 117/101 (11/09 1153) Pulse Rate: (P) 76 (11/09 1153)  Labs: Recent Labs    05/24/18 2000 05/25/18 0533  HGB 13.4  --   HCT 42.6  --   PLT 160  --   LABPROT 29.3* 27.6*  INR 2.82 2.61  CREATININE 0.81  --     Estimated Creatinine Clearance: 50.2 mL/min (by C-G formula based on SCr of 0.81 mg/dL).  Assessment: 84 y/o F on warfarin for multiple indications, pt presents to the ED with likely TIA, CT with no bleeding  INR goal per outpatient anti-coag notes is 3-4. Confirmed this is correct with Ms. Lohmann. PTA warfarin dose was 5 mg daily per patient. Last dose was 11/8 AM.   Starting therapeutic lovenox while INR is subtherapeutic.   Goal of Therapy:  INR 3-4 Monitor platelets by anticoagulation protocol: Yes   Plan:  Lovenox 1mg /kg SQ Q12H CBC Q72H F/u S&S of bleeding  Salome Arnt, PharmD, BCPS Please see AMION for all pharmacy numbers 05/25/2018 4:18 PM

## 2018-05-25 NOTE — Evaluation (Addendum)
Physical Therapy Evaluation Patient Details Name: Tina Wade MRN: [id number] DOB: 1928-10-01 Today's Date: 05/25/2018   History of Present Illness  Pt is an 84 y.o. female presenting with right-sided facial droop and slurring of speech. MRI negative for any acute findings. PMH including prior stroke, TIA, hypertension, hyperlipidemia, chronic A. fib on Coumadin, mechanical mitral valve hypothyroidism.    Clinical Impression  Pt presented sitting EOB, awake and willing to participate in therapy session. Prior to admission, pt reported that she ambulated with use of cane when outside of her house, no AD with in home ambulation. Pt stated that she was independent with ADLs. Pt currently able to perform transfers at modified independence and ambulate in hallway without an AD with min guard for safety. Pt with difficulty completing higher level balance tasks with ambulation but no overt LOB. PT will continue to follow acutely to progress mobility as tolerated.     Follow Up Recommendations No PT follow up    Equipment Recommendations  None recommended by PT    Recommendations for Other Services       Precautions / Restrictions Precautions Precautions: None Restrictions Weight Bearing Restrictions: No      Mobility  Bed Mobility Overal bed mobility: Modified Independent             General bed mobility comments: pt sitting EOB upon arrival  Transfers Overall transfer level: Modified independent Equipment used: None             General transfer comment: Increased time  Ambulation/Gait Ambulation/Gait assistance: Min guard Gait Distance (Feet): 150 Feet Assistive device: None Gait Pattern/deviations: Step-through pattern;Decreased step length - right;Decreased step length - left;Decreased stride length;Drifts right/left Gait velocity: decreased   General Gait Details: pt with mild instability but no overt LOB or need for physical assistance, min guard for  safety  Stairs Stairs: Yes Stairs assistance: Min guard Stair Management: Two rails;Step to pattern;Forwards Number of Stairs: 1(half size step to simulate home set up) General stair comments: min guard for safety  Wheelchair Mobility    Modified Rankin (Stroke Patients Only)       Balance Overall balance assessment: Needs assistance Sitting-balance support: Feet supported Sitting balance-Leahy Scale: Good Sitting balance - Comments: able to doff socks and don slip on shoes in sitting without assistance   Standing balance support: No upper extremity supported Standing balance-Leahy Scale: Fair               High level balance activites: Direction changes;Turns;Head turns High Level Balance Comments: pt requiring min guard for higher level balance tasks. No LOB but unable to maintain normal walking speed, almost completely stopping to perform head turns             Pertinent Vitals/Pain Pain Assessment: No/denies pain    Home Living Family/patient expects to be discharged to:: Private residence Living Arrangements: Children(Daughter and grand-daughter) Available Help at Discharge: Family;Available PRN/intermittently Type of Home: House Home Access: Stairs to enter   [employer] of Steps: 1 Home Layout: One level Home Equipment: Cane - single point      Prior Function Level of Independence: Independent with assistive device(s)         Comments: ambulates with cane when out in her community or outside of her home     Hand Dominance   Dominant Hand: Left    Extremity/Trunk Assessment   Upper Extremity Assessment Upper Extremity Assessment: Overall WFL for tasks assessed    Lower Extremity Assessment Lower  Extremity Assessment: Overall WFL for tasks assessed    Cervical / Trunk Assessment Cervical / Trunk Assessment: Normal  Communication   Communication: No difficulties  Cognition Arousal/Alertness: Awake/alert Behavior During  Therapy: WFL for tasks assessed/performed Overall Cognitive Status: Within Functional Limits for tasks assessed                                        General Comments General comments (skin integrity, edema, etc.): VSS. Provided education on BEFAST for stroke signs and symptoms    Exercises     Assessment/Plan    PT Assessment Patient needs continued PT services  PT Problem List Decreased balance;Decreased mobility;Decreased coordination       PT Treatment Interventions DME instruction;Gait training;Stair training;Functional mobility training;Therapeutic exercise;Balance training;Therapeutic activities;Neuromuscular re-education;Patient/family education    PT Goals (Current goals can be found in the Care Plan section)  Acute Rehab PT Goals Patient Stated Goal: return home today PT Goal Formulation: With patient Time For Goal Achievement: 06/08/18 Potential to Achieve Goals: Good    Frequency Min 3X/week   Barriers to discharge        Co-evaluation               AM-PAC PT "6 Clicks" Daily Activity  Outcome Measure Difficulty turning over in bed (including adjusting bedclothes, sheets and blankets)?: None Difficulty moving from lying on back to sitting on the side of the bed? : None Difficulty sitting down on and standing up from a chair with arms (e.g., wheelchair, bedside commode, etc,.)?: None Help needed moving to and from a bed to chair (including a wheelchair)?: None Help needed walking in hospital room?: A Little Help needed climbing 3-5 steps with a railing? : A Little 6 Click Score: 22    End of Session Equipment Utilized During Treatment: Gait belt Activity Tolerance: Patient tolerated treatment well Patient left: in chair;with call bell/phone within reach Nurse Communication: Mobility status PT Visit Diagnosis: Other abnormalities of gait and mobility (R26.89);Difficulty in walking, not elsewhere classified (R26.2)    Time:  7948-0165 PT Time Calculation (min) (ACUTE ONLY): 18 min   Charges:   PT Evaluation $PT Eval Low Complexity: 1 Low          Sherie Don, PT, DPT  Acute Rehabilitation Services Pager [phone] [facility] [place] 05/25/2018, 11:21 AM

## 2018-05-25 NOTE — Progress Notes (Signed)
Pt returned from MRI °

## 2018-05-25 NOTE — Evaluation (Signed)
Occupational Therapy Evaluation Patient Details Name: Tina Wade MRN: [id number] DOB: 16-Mar-1929 Today's Date: 05/25/2018    History of Present Illness 84 y.o. female presenting with right-sided facial droop and slurring of speech. PMH including prior stroke, TIA, hypertension, hyperlipidemia, chronic A. fib on Coumadin, mechanical mitral valve hypothyroidism.   Clinical Impression   PTA, pt was living with her daughter and granddaughter and was independent; enjoys reading. Pt currently performing near baseline function at supervision level for ADLs and functional mobility. Pt denies any vision deficits, numbness in UEs, and speech difficulties. Educated pt on BEFAST for stroke signs and symptoms. Recommend dc home once medically stable per physician. All acute OT needs met and will sign off.     Follow Up Recommendations  No OT follow up;Supervision/Assistance - 24 hour    Equipment Recommendations  None recommended by OT    Recommendations for Other Services       Precautions / Restrictions Precautions Precautions: Fall      Mobility Bed Mobility Overal bed mobility: Modified Independent             General bed mobility comments: elevated HOB  Transfers Overall transfer level: Modified independent               General transfer comment: Increased time    Balance Overall balance assessment: No apparent balance deficits (not formally assessed)                                         ADL either performed or assessed with clinical judgement   ADL Overall ADL's : Needs assistance/impaired Eating/Feeding: Independent;Sitting   Grooming: Oral care;Wash/dry face;Wash/dry hands;Brushing hair;Standing;Supervision/safety;Set up   Upper Body Bathing: Supervision/ safety;Standing   Lower Body Bathing: Supervison/ safety;Sit to/from stand   Upper Body Dressing : Supervision/safety;Sitting   Lower Body Dressing: Supervision/safety;Sit  to/from stand   Toilet Transfer: Supervision/safety;Ambulation(Simulated in room)           Functional mobility during ADLs: Supervision/safety General ADL Comments: Pt performing ADLs and functional mobility at supervision level. Pt presenting near baseline and reports she feels back to baseline.     Vision Baseline Vision/History: Wears glasses Wears Glasses: At all times Patient Visual Report: No change from baseline Vision Assessment?: Yes Eye Alignment: Within Functional Limits Ocular Range of Motion: Within Functional Limits Alignment/Gaze Preference: Within Defined Limits Tracking/Visual Pursuits: Able to track stimulus in all quads without difficulty Convergence: Within functional limits Visual Fields: No apparent deficits     Perception     Praxis      Pertinent Vitals/Pain Pain Assessment: No/denies pain     Hand Dominance Left   Extremity/Trunk Assessment Upper Extremity Assessment Upper Extremity Assessment: Overall WFL for tasks assessed   Lower Extremity Assessment Lower Extremity Assessment: Defer to PT evaluation   Cervical / Trunk Assessment Cervical / Trunk Assessment: Normal   Communication Communication Communication: No difficulties   Cognition Arousal/Alertness: Awake/alert Behavior During Therapy: WFL for tasks assessed/performed Overall Cognitive Status: Within Functional Limits for tasks assessed                                     General Comments  VSS. Provided education on BEFAST for stroke signs and symptoms    Exercises     Shoulder Instructions  Home Living Family/patient expects to be discharged to:: Private residence Living Arrangements: Children(Daughter and grand-daughter) Available Help at Discharge: Family;Available PRN/intermittently Type of Home: House Home Access: Stairs to enter [employer] of Steps: 1   Home Layout: One level     Bathroom Shower/Tub: Animal nutritionist: Standard     Home Equipment: [employer] - single point          Prior Functioning/Environment Level of Independence: Independent        Comments: ADLs, IADLs, driving        OT Problem List: Decreased activity tolerance;Decreased knowledge of use of DME or AE;Decreased knowledge of precautions      OT Treatment/Interventions:      OT Goals(Current goals can be found in the care plan section) Acute Rehab OT Goals Patient Stated Goal: "Go home and get these wires off of me" OT Goal Formulation: All assessment and education complete, DC therapy  OT Frequency:     Barriers to D/C:            Co-evaluation              AM-PAC PT "6 Clicks" Daily Activity     Outcome Measure Help from another person eating meals?: None Help from another person taking care of personal grooming?: None Help from another person toileting, which includes using toliet, bedpan, or urinal?: None Help from another person bathing (including washing, rinsing, drying)?: None Help from another person to put on and taking off regular upper body clothing?: None Help from another person to put on and taking off regular lower body clothing?: None 6 Click Score: 24   End of Session Nurse Communication: Mobility status  Activity Tolerance: Patient tolerated treatment well Patient left: in bed;with call bell/phone within reach;Other (comment)(At EOB eating her breakfast; RN notified)  OT Visit Diagnosis: Unsteadiness on feet (R26.81);Muscle weakness (generalized) (M62.81)                Time: 2694-8546 OT Time Calculation (min): 18 min Charges:  OT General Charges $OT Visit: 1 Visit OT Evaluation $OT Eval Moderate Complexity: [place], OTR/L Acute Rehab Pager: [phone] Office: [place] 05/25/2018, 8:17 AM

## 2018-05-25 NOTE — Progress Notes (Signed)
STROKE TEAM PROGRESS NOTE   HISTORY OF PRESENT ILLNESS (per record) Tina Wade is an 84 y.o. female who presented to the ED on Saturday evening with transient slurred speech and right facial droop. Her symptoms began approximately at 3 PM and she called EMS soon afterwards. On EMS arrival, her symptoms had resolved. Initial exam by EDP revealed no deficits. She has chronic atrial fibrillation as well as a mechanical heart valve (the valve is MRI compatible per patient). She is on warfarin with INR checks q month which have been stable. She states that the INR goal for her mechanical valve is 3-4. She also takes ASA but is unsure why this has been prescribed to her in conjunction with warfarin. She denies a history of carotid or cardiac stenting. Her medications also include rosuvastatin.   Her PMHx also includes hypercholesterolemia, HTN, hypothyroidism, right ICA atherosclerotic narrowing at 50-69% and left at 0-49% based on carotid ultrasound in 2013, as well as a stroke in 2013. She smokes 1/4 ppd.    SUBJECTIVE (INTERVAL HISTORY)     OBJECTIVE Vitals:   05/25/18 0541 05/25/18 0741 05/25/18 0824 05/25/18 1153  BP: 140/75 (!) 140/49  (!) (P) 117/101  Pulse: 75 (!) 114  (P) 76  Resp: 20 (!) 28    Temp: 98.8 F (37.1 C) 99.2 F (37.3 C)    TempSrc: Oral Oral    SpO2: 98% (!) 89% 94%   Weight:      Height:        CBC:  Recent Labs  Lab 05/24/18 2000  WBC 7.0  NEUTROABS 4.9  HGB 13.4  HCT 42.6  MCV 97.0  PLT 517    Basic Metabolic Panel:  Recent Labs  Lab 05/24/18 2000  NA 136  K 4.2  CL 100  CO2 28  GLUCOSE 120*  BUN 21  CREATININE 0.81  CALCIUM 9.0    Lipid Panel:     Component Value Date/Time   CHOL 133 05/25/2018 0533   TRIG 51 05/25/2018 0533   HDL 54 05/25/2018 0533   CHOLHDL 2.5 05/25/2018 0533   VLDL 10 05/25/2018 0533   LDLCALC 69 05/25/2018 0533   HgbA1c:  Lab Results  Component Value Date   HGBA1C 5.0 05/25/2018   Urine Drug Screen:  No results found for: LABOPIA, COCAINSCRNUR, LABBENZ, AMPHETMU, THCU, LABBARB  Alcohol Level No results found for: ETH  IMAGING   Ct Head Wo Contrast  Result Date: 05/24/2018 CLINICAL DATA:  Right-sided weakness and aphasia starting tonight. EXAM: CT HEAD WITHOUT CONTRAST TECHNIQUE: Contiguous axial images were obtained from the base of the skull through the vertex without intravenous contrast. COMPARISON:  09/05/2010 FINDINGS: Brain: Remote small upper cerebellar lacunar infarcts. Remote small left anterior thalamic lacunar infarct. Small remote lacunar infarcts of the head of left caudate nucleus. Lucencies along the inferior margin of the lentiform nuclei, probably from dilated perivascular spaces. Remote right frontal infarct, image 23/3. No intracranial hemorrhage or acute intracranial findings. Periventricular white matter and corona radiata hypodensities favor chronic ischemic microvascular white matter disease. Vascular: There is atherosclerotic calcification of the cavernous carotid arteries bilaterally. Skull: Unremarkable Sinuses/Orbits: Chronic right maxillary and left ethmoid sinusitis. 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 Other: No supplemental non-categorized findings. IMPRESSION: 1. No acute intracranial findings. 2. Remote cerebellar, basal ganglia, and left anterior thalamic lacunar infarcts. Old right frontal infarct. 3. 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 4. Periventricular white matter and corona radiata  hypodensities favor chronic ischemic microvascular white matter disease. 5. Atherosclerosis. 6. Mild chronic maxillary and ethmoid sinusitis. Electronically Signed   By: Van Clines M.D.   On: 05/24/2018 20:40   [name]  Result Date: 05/25/2018 CLINICAL DATA:  Initial evaluation for transient right-sided facial droop, slurred speech. EXAM: MRI HEAD WITHOUT CONTRAST MRA HEAD WITHOUT  CONTRAST TECHNIQUE: Multiplanar, multiecho pulse sequences of the brain and surrounding structures were obtained without intravenous contrast. Angiographic images of the head were obtained using MRA technique without contrast. COMPARISON:  Prior CT from 05/24/2018 FINDINGS: MRI HEAD FINDINGS Brain: Diffuse prominence of the CSF containing spaces compatible with generalized age-related cerebral atrophy. Patchy and confluent T2/FLAIR hyperintensity within the periventricular deep white matter both cerebral hemispheres most consistent with chronic small vessel ischemic disease, mild in nature. Remote lacunar infarct present within the left thalamus. Multiple scatter remote infarcts noted within the bilateral cerebellar hemispheres. Small remote bifrontal cortical infarcts, right side larger than left. No abnormal foci of restricted diffusion to suggest acute or subacute ischemia. Gray-white matter differentiation maintained. No acute or chronic intracranial hemorrhage. No mass lesion, midline shift or mass effect. No hydrocephalus. No extra-axial fluid collection. Pituitary gland normal. Vascular: Major intracranial vascular flow voids maintained. Skull and upper cervical spine: Craniocervical junction within normal limits. Visualized upper cervical spine demonstrates no significant finding. Bone marrow signal intensity within normal limits. No scalp soft tissue abnormality. Sinuses/Orbits: Globes and orbital soft tissues within normal limits. Patient status post ocular lens replacement bilaterally. Mild scattered mucosal thickening within the ethmoidal air cells and maxillary sinuses. Superimposed small right maxillary sinus retention cyst noted. No mastoid effusion. Inner ear structures normal. Other: None. MRA HEAD FINDINGS ANTERIOR CIRCULATION: Distal cervical segments of the internal carotid arteries are widely patent with antegrade flow. Petrous, cavernous, and supraclinoid segments patent without hemodynamically  significant stenosis. ICA termini widely patent. A1 segments patent bilaterally. Normal anterior communicating artery. Anterior cerebral arteries patent to their distal aspects without stenosis. M1 segments widely patent without stenosis. Normal MCA bifurcations. No proximal M2 occlusion. Distal MCA branches well perfused and symmetric. POSTERIOR CIRCULATION: Vertebral arteries patent to the vertebrobasilar junction without stenosis. Left vertebral artery slightly dominant. Posterior inferior cerebral arteries patent bilaterally. Basilar widely patent to its distal aspect. Superior cerebral arteries patent bilaterally. Posterior cerebral arteries well perfused and widely patent bilaterally. No intracranial aneurysm. IMPRESSION: MRI HEAD IMPRESSION: 1. No acute intracranial infarct or other abnormality identified. 2. Multiple chronic ischemic infarcts involving the bilateral frontal lobes, left thalamus, and bilateral cerebellar hemispheres. 3. Underlying age-related cerebral atrophy with mild chronic small vessel ischemic disease. MRA HEAD IMPRESSION: Normal intracranial MRA. No large vessel occlusion. No hemodynamically significant or correctable stenosis. Electronically Signed   By: Jeannine Boga M.D.   On: 05/25/2018 02:43   Mr Jodene Nam Head Wo Contrast  Result Date: 05/25/2018 CLINICAL DATA:  Initial evaluation for transient right-sided facial droop, slurred speech. EXAM: MRI HEAD WITHOUT CONTRAST MRA HEAD WITHOUT CONTRAST TECHNIQUE: Multiplanar, multiecho pulse sequences of the brain and surrounding structures were obtained without intravenous contrast. Angiographic images of the head were obtained using MRA technique without contrast. COMPARISON:  Prior CT from 05/24/2018 FINDINGS: MRI HEAD FINDINGS Brain: Diffuse prominence of the CSF containing spaces compatible with generalized age-related cerebral atrophy. Patchy and confluent T2/FLAIR hyperintensity within the periventricular deep white matter both  cerebral hemispheres most consistent with chronic small vessel ischemic disease, mild in nature. Remote lacunar infarct present within the left thalamus. Multiple scatter remote infarcts  noted within the bilateral cerebellar hemispheres. Small remote bifrontal cortical infarcts, right side larger than left. No abnormal foci of restricted diffusion to suggest acute or subacute ischemia. Gray-white matter differentiation maintained. No acute or chronic intracranial hemorrhage. No mass lesion, midline shift or mass effect. No hydrocephalus. No extra-axial fluid collection. Pituitary gland normal. Vascular: Major intracranial vascular flow voids maintained. Skull and upper cervical spine: Craniocervical junction within normal limits. Visualized upper cervical spine demonstrates no significant finding. Bone marrow signal intensity within normal limits. No scalp soft tissue abnormality. Sinuses/Orbits: Globes and orbital soft tissues within normal limits. Patient status post ocular lens replacement bilaterally. Mild scattered mucosal thickening within the ethmoidal air cells and maxillary sinuses. Superimposed small right maxillary sinus retention cyst noted. No mastoid effusion. Inner ear structures normal. Other: None. MRA HEAD FINDINGS ANTERIOR CIRCULATION: Distal cervical segments of the internal carotid arteries are widely patent with antegrade flow. Petrous, cavernous, and supraclinoid segments patent without hemodynamically significant stenosis. ICA termini widely patent. A1 segments patent bilaterally. Normal anterior communicating artery. Anterior cerebral arteries patent to their distal aspects without stenosis. M1 segments widely patent without stenosis. Normal MCA bifurcations. No proximal M2 occlusion. Distal MCA branches well perfused and symmetric. POSTERIOR CIRCULATION: Vertebral arteries patent to the vertebrobasilar junction without stenosis. Left vertebral artery slightly dominant. Posterior inferior  cerebral arteries patent bilaterally. Basilar widely patent to its distal aspect. Superior cerebral arteries patent bilaterally. Posterior cerebral arteries well perfused and widely patent bilaterally. No intracranial aneurysm. IMPRESSION: MRI HEAD IMPRESSION: 1. No acute intracranial infarct or other abnormality identified. 2. Multiple chronic ischemic infarcts involving the bilateral frontal lobes, left thalamus, and bilateral cerebellar hemispheres. 3. Underlying age-related cerebral atrophy with mild chronic small vessel ischemic disease. MRA HEAD IMPRESSION: Normal intracranial MRA. No large vessel occlusion. No hemodynamically significant or correctable stenosis. Electronically Signed   By: Jeannine Boga M.D.   On: 05/25/2018 02:43     Transthoracic Echocardiogram  05/25/2018 Impressions: - The patient appeared to be in atrial fibrillation. Normal LV size   with moderate LV hypertrophy. EF 50-55%, low normal to mildly   reduced. Mildly dilated RV with mild-moderately decreased   systolic function. Mechanical mitral valve appeared to function   normally. Moderate TR with mild pulmonary hypertension. Dilated   IVC suggestive of elevated RV filling pressure. The aortic valve   was calcified and restricted. Visually, looked like mild to   possibly moderate aortic stenosis but measured mean gradient was   not elevated.   Bilateral Carotid Dopplers  05/25/2018 Preliminary report:  1-39% right ICA stenosis.  40-59% left ICA stenosis.  Right vertebral artery flow is bidirectional.  Left vertebral artery flow is antegrade.   PHYSICAL EXAM Blood pressure (!) (P) 117/101, pulse (P) 76, temperature 99.2 F (37.3 C), temperature source Oral, resp. rate (!) 28, height 5\' 5"  (1.651 m), weight 77.1 kg, SpO2 94 %.        HOME MEDICATIONS:  Medications Prior to Admission  Medication Sig Dispense Refill  . aspirin 81 MG tablet Take 81 mg by mouth daily.      . calcium carbonate (OS-CAL)  600 MG TABS tablet Take 600 mg by mouth 2 (two) times daily with a meal.    . furosemide (LASIX) 40 MG tablet Take 40 mg by mouth daily.     Marland Kitchen levothyroxine (SYNTHROID, LEVOTHROID) 100 MCG tablet Take 100 mcg by mouth daily.     . montelukast (SINGULAIR) 10 MG tablet Take 10 mg by mouth at bedtime.    Marland Kitchen  POTASSIUM PO Take 1 tablet by mouth daily.    . rosuvastatin (CRESTOR) 10 MG tablet Take 10 mg by mouth daily.    . Tiotropium Bromide-Olodaterol 2.5-2.5 MCG/ACT AERS Inhale 2 puffs into the lungs daily.    Marland Kitchen warfarin (COUMADIN) 5 MG tablet TAKE ONE TO ONE AND ONE-HALF TABLETS DAILY OR AS DIRECTED (Patient taking differently: Take 5-7.5 mg by mouth daily. ) 110 tablet 1  . losartan (COZAAR) 25 MG tablet Take 1 tablet (25 mg total) by mouth daily. (Patient not taking: Reported on 12/04/2016) 90 tablet 2      HOSPITAL MEDICATIONS:  . arformoterol  15 mcg Nebulization BID  . aspirin EC  81 mg Oral Daily  . calcium carbonate  1,250 mg Oral BID WC  . levothyroxine  100 mcg Oral QAC breakfast  . montelukast  10 mg Oral QHS  . rosuvastatin  10 mg Oral Daily  . umeclidinium bromide  1 puff Inhalation Daily  . warfarin  5 mg Oral ONCE-1800  . Warfarin - Pharmacist Dosing Inpatient   Does not apply q1800    ALLERGIES No Known Allergies  PAST MEDICAL HISTORY Past Medical History:  Diagnosis Date  . Arrhythmia    chronic atrial fib. with controlled rate ,on warfarin therapy  . High cholesterol   . Hypertension   . Lower extremity edema    mild -stable  . Mitral valve stenosis 10/1994   s/p St. Jude mechanical valve placement -66M-101 MODEL  . Stroke ([facility]) 08/2010   from office visit 2013- the setting of normal INR of 2.8 ;therefore increased goal to 3.5  . Thyroid disease    hypothyroidism on synthroid    SURGICAL HISTORY Past Surgical History:  Procedure Laterality Date  . CAROTID DOPPLER  04/25/2012   right ICA SMALL AMT FIBROUS PLAQUE;LEFT BULB SMALL TO MODERATE AMT OF MIXED  DENSITY PLAQUE 50-69%; LEFT ICA 0-49%  . DOPPLER ECHOCARDIOGRAPHY  09/13/2011   mild concentric LVH ,EF 45-50%,mild to moderate dilated right ventricle ,moderately dilated left atrium,prosthetic vlve noemal geadients and normal functioning bileaflet valve.The aortic valve had only trace regurg. and was mildly sclerotic and the septal wall motion with post-op state makes the echo EF difficult to assess  . MITRAL VALVE SURGERY  10/1994  . NM MYOCAR PERF WALL MOTION  03/08/2004   done in [place]-----adenosine--normal study,no ischemia  . TRANSESOPHAGEAL ECHOCARDIOGRAM  08/2010   showed mild concentric LVH, EF 50-60% with moderate aortic regurg,calcified mitral annnulus with mechanical  prosthesis well functioning smoke inthe left atrial cavity noted;mod. to severe dilated. this when she was diagnosise with afib did not undergo cardioversion    FAMILY HISTORY Family History  Problem Relation Age of Onset  . Heart disease Mother   . Tuberculosis Father     SOCIAL HISTORY  reports that she has been smoking cigarettes. She has been smoking about 0.25 packs per day. She has never used smokeless tobacco. She reports that she does not drink alcohol or use drugs.  ASSESSMENT/PLAN Ms. Tina Wade is a 84 y.o. female with history of tobacco use, previous strokes, Htn, Hld, chronic atrial fibrillation as well as a mechanical heart valve  presenting with transient slurred speech and right facial droop. She did not receive IV t-PA due to anticoagulation and resolution of deficits.   TIA:  Admission INR 2.82 (too low for mechanical heart valve)  Resultant  Back to baseline  LDL - 69  HgbA1c - 5.0  VTE prophylaxis - warfarin  Diet  -  Heart healthy with thin liquids.  warfarin daily and ASA 81 mg QD prior to admission, now on warfarin daily  and ASA 81 mg QD  Patient counseled to be compliant with her antithrombotic medications  Ongoing aggressive stroke risk factor management  Therapy  recommendations:  No F/U  Disposition:  Pending  Hypertension  Stable . Permissive hypertension (OK if < 220/120) but gradually normalize in 5-7 days . Long-term BP goal normotensive  Hyperlipidemia  Lipid lowering medication PTA:  Crestor 10 mg daily  LDL 69, goal < 70  Current lipid lowering medication: Crestor 10 mg daily  Continue statin at discharge  Other Stroke Risk Factors  Advanced age  Cigarette smoker - advised to stop smoking  Obesity, Body mass index is 28.29 kg/m., recommend weight loss, diet and exercise as appropriate   Hx stroke/TIA  Afib  Mechanical heart valve   Plan TIA with subtherapeutic INR.  On coumadin due to mechanical heart valve.  Goal INR is 3-4.  Latest INR is 2.68.  MRI showed old infarcts but no acute changes.  MRA was negative.  CDUS was negative.  LVEF was 50-55%.  Ok to discharge home with instructions to monitor INR to ensure reaches therapeutic levels.  You can bridge with Lovenox 1 mg/kg SQ q12hr at home until INR therapeutic.   Follow up in stroke clinic in 1-2 months.     Rogue Jury, MS, MD   Hospital day # 0    To contact Stroke Continuity provider, please refer to [email or web address] After hours, contact General Neurology

## 2018-05-25 NOTE — Progress Notes (Addendum)
ANTICOAGULATION CONSULT NOTE - Follow-up Consult  Pharmacy Consult for Warfarin  Indication: Mechanical heart valve, atrial fibrillation, hx CVA  No Known Allergies  Patient Measurements: Height: 5\' 5"  (165.1 cm) Weight: 170 lb (77.1 kg) IBW/kg (Calculated) : 57  Vital Signs: Temp: 99.2 F (37.3 C) (11/09 0741) Temp Source: Oral (11/09 0741) BP: (P) 117/101 (11/09 1153) Pulse Rate: (P) 76 (11/09 1153)  Labs: Recent Labs    05/24/18 2000 05/25/18 0533  HGB 13.4  --   HCT 42.6  --   PLT 160  --   LABPROT 29.3* 27.6*  INR 2.82 2.61  CREATININE 0.81  --     Estimated Creatinine Clearance: 50.2 mL/min (by C-G formula based on SCr of 0.81 mg/dL).   Medical History: Past Medical History:  Diagnosis Date  . Arrhythmia    chronic atrial fib. with controlled rate ,on warfarin therapy  . High cholesterol   . Hypertension   . Lower extremity edema    mild -stable  . Mitral valve stenosis 10/1994   s/p St. Jude mechanical valve placement -65M-101 MODEL  . Stroke ([facility]) 08/2010   from office visit 2013- the setting of normal INR of 2.8 ;therefore increased goal to 3.5  . Thyroid disease    hypothyroidism on synthroid     Assessment: 84 y/o F on warfarin for multiple indications, pt presents to the ED with likely TIA, CT with no bleeding  INR goal per outpatient anti-coag notes is 3-4. Confirmed this is correct with Ms. Charo. PTA warfarin dose was 5 mg daily per patient. Last dose was 11/8 AM.  INR is subthearpeutic at 2.61 this morning. Patient therapeutic on home dose prior to admission and patient states she has been on 5 mg daily for "20 years". Will continue with home dose for now.  Goal of Therapy:  INR 3-4 Monitor platelets by anticoagulation protocol: Yes   Plan:  -Warfarin 5 mg PO x1 tonight -Daily PT/INR  Jackson Latino, PharmD PGY1 Pharmacy Resident Phone [phone] 05/25/2018     2:01 PM

## 2018-05-25 NOTE — Progress Notes (Signed)
Pt off floor to MRI

## 2018-05-25 NOTE — Discharge Summary (Addendum)
Physician Discharge Summary  Tina Wade TDS:[id number] DOB: [birth year] DOA: 05/24/2018  PCP: System, Pcp Not In  Admit date: 05/24/2018 Discharge date: 05/25/2018  Time spent: 45 minutes  Recommendations for Outpatient Follow-up:  1. Follow up with Dr Leonie Man 1-2 months. Office will contact 2. Check INR Monday 05/27/18. 3. Take Lovenox as directed   Discharge Diagnoses:  Principal Problem:   TIA (transient ischemic attack) Active Problems:   H/O mitral valve replacement   Chronic atrial fibrillation   Essential hypertension   HLD (hyperlipidemia)   Tobacco use   Reactive airway disease   Discharge Condition: stable  Diet recommendation: heart healthy  Filed Weights   05/24/18 1951  Weight: 77.1 kg    History of present illness:  Tina Wade is a 84 y.o. female with medical history significant of prior stroke, TIA, hypertension, hyperlipidemia, chronic A. fib on Coumadin, mechanical mitral valve hypothyroidism presented to the hospital on 05/24/18 for evaluation of right-sided facial droop and slurring of speech.  Symptoms had resolved when EMS arrived. Patient stated at 6 PM  she started experiencing left-sided facial droop, trembling of both of her hands, and slurring of speech.  Symptoms resolved by 7:30 PM.  Denied having any focal weakness.  Stated she had TIAs in the past and a stroke 5 years ago but does not have any residual deficits.  She has been smoking cigarettes for the past 62 years and continues to smoke 1/2 pack/day.  Hospital Course:  TIA Experienced acute onset left-sided facial droop, trembling of hands, and slurring of speech today.  Symptoms resolved within 90 minutes. CT head showing remote cerebellar, basal ganglia, and left anterior thalamic lacunar infarcts, old right frontal infarct, chronic ischemic microvascular white matter disease, and no acute intracranial findings. MRI No acute intracranial infarct or other abnormality identified. Multiple  chronic ischemic infarcts involving the bilateral frontal lobes, left thalamus, and bilateral cerebellar hemispheres. Underlying age-related cerebral atrophy with mild chronic small vessel ischemic disease. echo with The patient appeared to be in atrial fibrillation. Normal LV size  with moderate LV hypertrophy. EF 50-55%, low normal to mildly  reduced. Mildly dilated RV with mild-moderately decreased  systolic function. Mechanical mitral valve appeared to function  normally. Moderate TR with mild pulmonary hypertension. Dilated  IVC suggestive of elevated RV filling pressure. The aortic valve   was calcified and restricted. Visually, looked like mild to  possibly moderate aortic stenosis but measured mean gradient was  not elevated. Carotid dopplar with 1-39% right ICA stenosis. 40-59% left ICA stenosis right vertebral artery flow is bidirectional. Lipid panel within limits of normal. Dr sethi's office will contact for follow up   Chronic A. Fib, mechanical mitral valve  -[id number] 6.  Not on any rate control agent. On Coumadin.  INR 2.6 on day of discharge. INR goal 3-4. Neuro opines lovenox bridge until INR goal. Have INR checked 11/11. Continue home Coumadin dose. lovenox bid until INR therapeutic. Patient has experience with self injection  Hypertension -Currently normotensive.  Hypothyroidism -Continue home Synthroid  Hyperlipidemia -Continue home Crestor   Tobacco use Patient has been smoking for the past 62 years and continues to smoke 1/2 pack/day.  I discussed smoking cessation but she appears to be in the pre-contemplative stage at this time.  She is declining nicotine patch use in the hospital.  Reactive airway disease -Stable.  No bronchospasm.  Continue home inhalers and Singulair.   Procedures:  Echo  Carotid doppler  Consultations:   neuro  Discharge Exam: Vitals:   05/25/18 0824 05/25/18 1153  BP:  (!) (P) 117/101  Pulse:  (P) 76  Resp:    Temp:     SpO2: 94%     General: sitting up in chair denies pain discomfort Cardiovascular: irregularly irregular no mgr no LE edema Respiratory: normal effort BS clear bilaterally no wheeze  Discharge Instructions   Discharge Instructions    Ambulatory referral to Neurology   Complete by:  As directed    An appointment is requested in approximately: 6 weeks   Diet - low sodium heart healthy   Complete by:  As directed    Discharge instructions   Complete by:  As directed    Have INR checked Monday 05/27/18 Take medications as directed Dr Clydene Fake office will contact for follow up   Increase activity slowly   Complete by:  As directed      Allergies as of 05/25/2018   No Known Allergies     Medication List    TAKE these medications   aspirin 81 MG tablet Take 81 mg by mouth daily.   calcium carbonate 600 MG Tabs tablet Commonly known as:  OS-CAL Take 600 mg by mouth 2 (two) times daily with a meal.   enoxaparin 80 MG/0.8ML injection Commonly known as:  LOVENOX Inject 0.75 mLs (75 mg total) into the skin every 12 (twelve) hours. Have inr checked 05/27/18   furosemide 40 MG tablet Commonly known as:  LASIX Take 40 mg by mouth daily.   levothyroxine 100 MCG tablet Commonly known as:  SYNTHROID, LEVOTHROID Take 100 mcg by mouth daily.   losartan 25 MG tablet Commonly known as:  COZAAR Take 1 tablet (25 mg total) by mouth daily.   montelukast 10 MG tablet Commonly known as:  SINGULAIR Take 10 mg by mouth at bedtime.   POTASSIUM PO Take 1 tablet by mouth daily.   rosuvastatin 10 MG tablet Commonly known as:  CRESTOR Take 10 mg by mouth daily.   Tiotropium Bromide-Olodaterol 2.5-2.5 MCG/ACT Aers Inhale 2 puffs into the lungs daily.   warfarin 5 MG tablet Commonly known as:  COUMADIN TAKE ONE TO ONE AND ONE-HALF TABLETS DAILY OR AS DIRECTED What changed:  See the new instructions.      No Known Allergies Follow-up Information    Garvin Fila, MD Follow  up in 6 week(s).   Specialties:  Neurology, Radiology Why:  Office will call you for appointment Contact information: [address] [place] [address] [phone]            The results of significant diagnostics from this hospitalization (including imaging, microbiology, ancillary and laboratory) are listed below for reference.    Significant Diagnostic Studies: Ct Head Wo Contrast  Result Date: 05/24/2018 CLINICAL DATA:  Right-sided weakness and aphasia starting tonight. EXAM: CT HEAD WITHOUT CONTRAST TECHNIQUE: Contiguous axial images were obtained from the base of the skull through the vertex without intravenous contrast. COMPARISON:  09/05/2010 FINDINGS: Brain: Remote small upper cerebellar lacunar infarcts. Remote small left anterior thalamic lacunar infarct. Small remote lacunar infarcts of the head of left caudate nucleus. Lucencies along the inferior margin of the lentiform nuclei, probably from dilated perivascular spaces. Remote right frontal infarct, image 23/3. No intracranial hemorrhage or acute intracranial findings. Periventricular white matter and corona radiata hypodensities favor chronic ischemic microvascular white matter disease. Vascular: There is atherosclerotic calcification of the cavernous carotid arteries bilaterally. Skull: Unremarkable Sinuses/Orbits: Chronic right maxillary and left ethmoid sinusitis.  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 Other: No supplemental non-categorized findings. IMPRESSION: 1. No acute intracranial findings. 2. Remote cerebellar, basal ganglia, and left anterior thalamic lacunar infarcts. Old right frontal infarct. 3. 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 4. Periventricular white matter and corona radiata hypodensities favor chronic ischemic microvascular white matter disease. 5. Atherosclerosis. 6. Mild chronic maxillary and ethmoid sinusitis.  Electronically Signed   By: Van Clines M.D.   On: 05/24/2018 20:40   [name]  Result Date: 05/25/2018 CLINICAL DATA:  Initial evaluation for transient right-sided facial droop, slurred speech. EXAM: MRI HEAD WITHOUT CONTRAST MRA HEAD WITHOUT CONTRAST TECHNIQUE: Multiplanar, multiecho pulse sequences of the brain and surrounding structures were obtained without intravenous contrast. Angiographic images of the head were obtained using MRA technique without contrast. COMPARISON:  Prior CT from 05/24/2018 FINDINGS: MRI HEAD FINDINGS Brain: Diffuse prominence of the CSF containing spaces compatible with generalized age-related cerebral atrophy. Patchy and confluent T2/FLAIR hyperintensity within the periventricular deep white matter both cerebral hemispheres most consistent with chronic small vessel ischemic disease, mild in nature. Remote lacunar infarct present within the left thalamus. Multiple scatter remote infarcts noted within the bilateral cerebellar hemispheres. Small remote bifrontal cortical infarcts, right side larger than left. No abnormal foci of restricted diffusion to suggest acute or subacute ischemia. Gray-white matter differentiation maintained. No acute or chronic intracranial hemorrhage. No mass lesion, midline shift or mass effect. No hydrocephalus. No extra-axial fluid collection. Pituitary gland normal. Vascular: Major intracranial vascular flow voids maintained. Skull and upper cervical spine: Craniocervical junction within normal limits. Visualized upper cervical spine demonstrates no significant finding. Bone marrow signal intensity within normal limits. No scalp soft tissue abnormality. Sinuses/Orbits: Globes and orbital soft tissues within normal limits. Patient status post ocular lens replacement bilaterally. Mild scattered mucosal thickening within the ethmoidal air cells and maxillary sinuses. Superimposed small right maxillary sinus retention cyst noted. No  mastoid effusion. Inner ear structures normal. Other: None. MRA HEAD FINDINGS ANTERIOR CIRCULATION: Distal cervical segments of the internal carotid arteries are widely patent with antegrade flow. Petrous, cavernous, and supraclinoid segments patent without hemodynamically significant stenosis. ICA termini widely patent. A1 segments patent bilaterally. Normal anterior communicating artery. Anterior cerebral arteries patent to their distal aspects without stenosis. M1 segments widely patent without stenosis. Normal MCA bifurcations. No proximal M2 occlusion. Distal MCA branches well perfused and symmetric. POSTERIOR CIRCULATION: Vertebral arteries patent to the vertebrobasilar junction without stenosis. Left vertebral artery slightly dominant. Posterior inferior cerebral arteries patent bilaterally. Basilar widely patent to its distal aspect. Superior cerebral arteries patent bilaterally. Posterior cerebral arteries well perfused and widely patent bilaterally. No intracranial aneurysm. IMPRESSION: MRI HEAD IMPRESSION: 1. No acute intracranial infarct or other abnormality identified. 2. Multiple chronic ischemic infarcts involving the bilateral frontal lobes, left thalamus, and bilateral cerebellar hemispheres. 3. Underlying age-related cerebral atrophy with mild chronic small vessel ischemic disease. MRA HEAD IMPRESSION: Normal intracranial MRA. No large vessel occlusion. No hemodynamically significant or correctable stenosis. Electronically Signed   By: Jeannine Boga M.D.   On: 05/25/2018 02:43   Mr Jodene Nam Head Wo Contrast  Result Date: 05/25/2018 CLINICAL DATA:  Initial evaluation for transient right-sided facial droop, slurred speech. EXAM: MRI HEAD WITHOUT CONTRAST MRA HEAD WITHOUT CONTRAST TECHNIQUE: Multiplanar, multiecho pulse sequences of the brain and surrounding structures were obtained without intravenous contrast. Angiographic images of the head were obtained using MRA technique without contrast.  COMPARISON:  Prior CT from 05/24/2018 FINDINGS:  MRI HEAD FINDINGS Brain: Diffuse prominence of the CSF containing spaces compatible with generalized age-related cerebral atrophy. Patchy and confluent T2/FLAIR hyperintensity within the periventricular deep white matter both cerebral hemispheres most consistent with chronic small vessel ischemic disease, mild in nature. Remote lacunar infarct present within the left thalamus. Multiple scatter remote infarcts noted within the bilateral cerebellar hemispheres. Small remote bifrontal cortical infarcts, right side larger than left. No abnormal foci of restricted diffusion to suggest acute or subacute ischemia. Gray-white matter differentiation maintained. No acute or chronic intracranial hemorrhage. No mass lesion, midline shift or mass effect. No hydrocephalus. No extra-axial fluid collection. Pituitary gland normal. Vascular: Major intracranial vascular flow voids maintained. Skull and upper cervical spine: Craniocervical junction within normal limits. Visualized upper cervical spine demonstrates no significant finding. Bone marrow signal intensity within normal limits. No scalp soft tissue abnormality. Sinuses/Orbits: Globes and orbital soft tissues within normal limits. Patient status post ocular lens replacement bilaterally. Mild scattered mucosal thickening within the ethmoidal air cells and maxillary sinuses. Superimposed small right maxillary sinus retention cyst noted. No mastoid effusion. Inner ear structures normal. Other: None. MRA HEAD FINDINGS ANTERIOR CIRCULATION: Distal cervical segments of the internal carotid arteries are widely patent with antegrade flow. Petrous, cavernous, and supraclinoid segments patent without hemodynamically significant stenosis. ICA termini widely patent. A1 segments patent bilaterally. Normal anterior communicating artery. Anterior cerebral arteries patent to their distal aspects without stenosis. M1 segments widely patent  without stenosis. Normal MCA bifurcations. No proximal M2 occlusion. Distal MCA branches well perfused and symmetric. POSTERIOR CIRCULATION: Vertebral arteries patent to the vertebrobasilar junction without stenosis. Left vertebral artery slightly dominant. Posterior inferior cerebral arteries patent bilaterally. Basilar widely patent to its distal aspect. Superior cerebral arteries patent bilaterally. Posterior cerebral arteries well perfused and widely patent bilaterally. No intracranial aneurysm. IMPRESSION: MRI HEAD IMPRESSION: 1. No acute intracranial infarct or other abnormality identified. 2. Multiple chronic ischemic infarcts involving the bilateral frontal lobes, left thalamus, and bilateral cerebellar hemispheres. 3. Underlying age-related cerebral atrophy with mild chronic small vessel ischemic disease. MRA HEAD IMPRESSION: Normal intracranial MRA. No large vessel occlusion. No hemodynamically significant or correctable stenosis. Electronically Signed   By: Jeannine Boga M.D.   On: 05/25/2018 02:43    Microbiology: No results found for this or any previous visit (from the past 240 hour(s)).   Labs: Basic Metabolic Panel: Recent Labs  Lab 05/24/18 2000  NA 136  K 4.2  CL 100  CO2 28  GLUCOSE 120*  BUN 21  CREATININE 0.81  CALCIUM 9.0   Liver Function Tests: Recent Labs  Lab 05/24/18 2000  AST 45*  ALT 22  ALKPHOS 62  BILITOT 0.7  PROT 7.4  ALBUMIN 3.5   No results for input(s): LIPASE, AMYLASE in the last 168 hours. No results for input(s): AMMONIA in the last 168 hours. CBC: Recent Labs  Lab 05/24/18 2000  WBC 7.0  NEUTROABS 4.9  HGB 13.4  HCT 42.6  MCV 97.0  PLT 160   Cardiac Enzymes: No results for input(s): CKTOTAL, CKMB, CKMBINDEX, TROPONINI in the last 168 hours. BNP: BNP (last 3 results) No results for input(s): BNP in the last 8760 hours.  ProBNP (last 3 results) No results for input(s): PROBNP in the last 8760 hours.  CBG: No results for  input(s): GLUCAP in the last 168 hours.     Signed:  Radene Gunning NP  [facility] 05/25/2018, 4:36 PM  Patient was seen, examined,treatment plan was discussed with the Advance Practice Provider.  I have personally reviewed the clinical findings, labs, EKG, imaging studies and management of this patient in detail. I have also reviewed the orders written for this patient which were under my direction. I agree with the documentation, as recorded by the Advance Practice Provider.   KANOELANI DOBIES is a 84 y.o. female who presented with a TIA.  Discussed with neurology.  Patient has a fib and a mechancical valve-- goal is 3.5-4.  She is subtherapeutic.  Plan is to bridge with lovenox until INR close to 3.5.  INR to be checked Monday/Tuesday (last INR was 10/17 and 3.8).  Has done lovenox in the past and is comfortable giving self injections.  Geradine Girt, DO

## 2018-05-25 NOTE — Progress Notes (Signed)
VASCULAR LAB PRELIMINARY  PRELIMINARY  PRELIMINARY  PRELIMINARY  Carotid duplex completed.    Preliminary report:  1-39% right ICA stenosis.  40-59% left ICA stenosis.  Right vertebral artery flow is bidirectional.  Left vertebral artery flow is antegrade.   Darinda [place], RVT 05/25/2018, 12:52 PM

## 2018-05-25 NOTE — Progress Notes (Signed)
  Echocardiogram 2D Echocardiogram has been performed.  Tina Wade 05/25/2018, 9:47 AM

## 2018-05-25 NOTE — Care Management (Addendum)
Lovenox prescription sent to [facility].  Pharmacy states patient's cost is $10 for 10 syringes.  Daughter states they are able to get Lovenox filled without issue.

## 2018-07-31 ENCOUNTER — Ambulatory Visit (INDEPENDENT_AMBULATORY_CARE_PROVIDER_SITE_OTHER): Payer: Medicare Other | Admitting: Neurology

## 2018-07-31 ENCOUNTER — Encounter: Payer: Self-pay | Admitting: Neurology

## 2018-07-31 VITALS — BP 131/65 | HR 62 | Ht 65.0 in | Wt 179.8 lb

## 2018-07-31 DIAGNOSIS — R202 Paresthesia of skin: Secondary | ICD-10-CM | POA: Diagnosis not present

## 2018-07-31 DIAGNOSIS — R4789 Other speech disturbances: Secondary | ICD-10-CM

## 2018-07-31 DIAGNOSIS — G459 Transient cerebral ischemic attack, unspecified: Secondary | ICD-10-CM

## 2018-07-31 NOTE — Progress Notes (Signed)
[facility] [address] [place]. [place] [address] [phone]       OFFICE CONSULT NOTE  Tina Wade Date of Birth:  08-Sep-1928 Medical Record Number:  [id number]   Referring MD: Billey Chang  Reason for Referral: TIA  HPI: Tina Wade is a pleasant 85 year old Caucasian lady seen today for initial office consultation visit for TIA.  She is accompanied by her daughter.  History is obtained from them, review of electronic medical records and have personally reviewed imaging films.  She presented on 05/24/2018 with sudden onset of transient slurred speech and right facial droop.  Symptoms lasted barely 20 minutes and improved by the time she reached the hospital.  She has a history of chronic atrial fibrillation and mechanical heart valve and is on chronic anticoagulation with warfarin and her INR was 2.88 on admission.  CT scan was negative for bleed or infarct.  MRI scan of the brain was also negative for acute stroke but did show multiple chronic infarcts involving bilateral frontal lobes, left thalamus and bilateral cerebellar hemispheres.  MRI of the brain showed no large vessel stenosis or occlusion.  Carotid ultrasound showed 40 to 59% left ICA stenosis.  Transthoracic echo showed ejection fraction of 50 to 55% with moderate left ventricular hypertrophy.  LDL cholesterol was 69 mg percent.  Hemoglobin A1c was 5.0.  Patient was continued on warfarin and she was also on aspirin 81 mg daily which was continued.  She was advised to quit smoking.  She states she is done well since discharge and has not had any further definite stroke or TIA episodes however her daughter was present states that she did have remote history of TIAs about 15 years ago.  Over the last several years she gets about 2-3 episodes every few months in which she has transient word finding difficulties and gets stuck in sentences and is unable to complete this last barely a minute or so and resolves.   On one occasion she found her to be having her hands clenched.  The patient however is fully aware of her surroundings and is never confused or staring.  She has not been evaluated for possible seizures however she does state that at one point years ago she did see neurology in [place] who had raised this possibility.  I do not have those records to review today.  She also has a new complaints of of prickling sensation in her feet which is discomforting particularly at night.  She does feel that the ground does not feel the same with her feet as it does with her hand when she is standing.  She feels her balance is off when she is had no falls or injuries.  She has not been evaluated for neuropathy.  ROS:   14 system review of systems is positive for easy bruising, easy bleeding, burning feet, snoring, all other systems negative  PMH:  Past Medical History:  Diagnosis Date  . Arrhythmia    chronic atrial fib. with controlled rate ,on warfarin therapy  . High cholesterol   . Hypertension   . Lower extremity edema    mild -stable  . Mitral valve stenosis 10/1994   s/p St. Jude mechanical valve placement -57M-101 MODEL  . Stroke ([place]) 08/2010   from office visit 2013- the setting of normal INR of 2.8 ;therefore increased goal to 3.5  . Thyroid disease    hypothyroidism on synthroid    Social History:  Social History  Socioeconomic History  . Marital status: Widowed    Spouse name: Not on file  . Number of children: Not on file  . Years of education: Not on file  . Highest education level: Not on file  Occupational History  . Not on file  Social Needs  . Financial resource strain: Not on file  . Food insecurity:    Worry: Not on file    Inability: Not on file  . Transportation needs:    Medical: Not on file    Non-medical: Not on file  Tobacco Use  . Smoking status: Current Every Day Smoker    Packs/day: 0.25    Types: Cigarettes  . Smokeless tobacco: Never Used  .  Tobacco comment: one pack every  3 days  Substance and Sexual Activity  . Alcohol use: No  . Drug use: No  . Sexual activity: Not on file  Lifestyle  . Physical activity:    Days per week: Not on file    Minutes per session: Not on file  . Stress: Not on file  Relationships  . Social connections:    Talks on phone: Not on file    Gets together: Not on file    Attends religious service: Not on file    Active member of club or organization: Not on file    Attends meetings of clubs or organizations: Not on file    Relationship status: Not on file  . Intimate partner violence:    Fear of current or ex partner: Not on file    Emotionally abused: Not on file    Physically abused: Not on file    Forced sexual activity: Not on file  Other Topics Concern  . Not on file  Social History Narrative  . Not on file    Medications:   Current Outpatient Medications on File Prior to Visit  Medication Sig Dispense Refill  . aspirin 81 MG tablet Take 81 mg by mouth daily.      . calcium carbonate (OS-CAL) 600 MG TABS tablet Take 600 mg by mouth 2 (two) times daily with a meal.    . furosemide (LASIX) 40 MG tablet Take 40 mg by mouth daily.     Marland Kitchen levothyroxine (SYNTHROID, LEVOTHROID) 100 MCG tablet Take 100 mcg by mouth daily.     Marland Kitchen losartan (COZAAR) 25 MG tablet Take 1 tablet (25 mg total) by mouth daily. 90 tablet 2  . montelukast (SINGULAIR) 10 MG tablet Take 10 mg by mouth at bedtime.    . rosuvastatin (CRESTOR) 10 MG tablet Take 10 mg by mouth daily.    . Tiotropium Bromide-Olodaterol (STIOLTO RESPIMAT) 2.5-2.5 MCG/ACT AERS USE 2 INHALATIONS DAILY    . warfarin (COUMADIN) 5 MG tablet TAKE ONE TO ONE AND ONE-HALF TABLETS DAILY OR AS DIRECTED (Patient taking differently: Take 5-7.5 mg by mouth daily. ) 110 tablet 1   No current facility-administered medications on file prior to visit.     Allergies:  No Known Allergies  Physical Exam General: Frail elderly Caucasian lady, seated, in no  evident distress Head: head normocephalic and atraumatic.   Neck: supple with no carotid or supraclavicular bruits Cardiovascular: regular rate and rhythm, no murmurs Musculoskeletal: no deformity Skin:  no rash/petichiae Vascular:  Normal pulses all extremities  Neurologic Exam Mental Status: Awake and fully alert. Oriented to place and time. Recent and remote memory intact. Attention span, concentration and fund of knowledge appropriate. Mood and affect appropriate.  Cranial Nerves: Fundoscopic exam  reveals sharp disc margins. Pupils equal, briskly reactive to light. Extraocular movements full without nystagmus. Visual fields full to confrontation. Hearing intact. Facial sensation intact. Face, tongue, palate moves normally and symmetrically.  Motor: Normal bulk and tone. Normal strength in all tested extremity muscles. Sensory.:  Diminished  touch , pinprick in both feet in stocking distribution with hyperesthesia but intact, position and vibratory sensation.  Romberg's negative Coordination: Rapid alternating movements normal in all extremities. Finger-to-nose and heel-to-shin performed accurately bilaterally. Gait and Station: Arises from chair without difficulty. Stance is normal. Gait demonstrates normal stride length and balance slightly broad-based gait..  Not able to heel, toe and tandem walk without difficulty.  Reflexes: 1+ and symmetric. Toes downgoing.   NIHSS  0 Modified Rankin  2   ASSESSMENT: 85 year old lady with episode of transient slurred speech and facial droop in November 2019 likely TIA due to small vessel disease.  She also has mechanical heart valve and is on long-term anticoagulation.  Multiple vascular risk factors of mechanical heart valve, chronic atrial fibrillation, hypertension and hyperlipidemia.  History of recurrent episodes of transient speech disturbance of unclear etiology.  Also new complaints of lower extremity paresthesias likely small fiber peripheral  neuropathy.    PLAN: I had a long d/w patient and her daughter about her recent TIA, episodes of speech disturbance, feet paresthesias,, risk for recurrent stroke/TIAs, personally independently reviewed imaging studies and stroke evaluation results and answered questions.Continue warfarin daily  for secondary stroke prevention for her AFIB and mechanical heart valve with INR goal 3-3.5 and maintain strict control of hypertension with blood pressure goal below 130/90, diabetes with hemoglobin A1c goal below 6.5% and lipids with LDL cholesterol goal below 70 mg/dL. I also advised the patient to eat a healthy diet with plenty of whole grains, cereals, fruits and vegetables, exercise regularly and maintain ideal body weight.  Check MRI scan of the brain with and without contrast, EEG, neuropathy panel labs and EMG/NCV study.  The patient's paresthesias at the present time are not bothersome to justify medications but she will call me if needed.  Greater than 50% time during this prolonged 60-minute consultation visit involving medical decision making of high complexity was spent on counseling and coordination of care about her strokes, mechanical heart valve, neuropathy and discussion about speech disturbance episodes and answering questions followup in the future with me in 3 months or call earlier if necessary. Tina Contras, MD  [facility] [address] [place], [place] [address]  Phone [phone] Fax [phone] Note: This document was prepared with digital dictation and possible smart phrase technology. Any transcriptional errors that result from this process are unintentional.

## 2018-07-31 NOTE — Patient Instructions (Signed)
I had a long d/w patient and her daughter about her recent TIA, episodes of speech disturbance, feet paresthesias,, risk for recurrent stroke/TIAs, personally independently reviewed imaging studies and stroke evaluation results and answered questions.Continue warfarin daily  for secondary stroke prevention for her AFIB and mechanical heart valve with INR goal 3-3.5 and maintain strict control of hypertension with blood pressure goal below 130/90, diabetes with hemoglobin A1c goal below 6.5% and lipids with LDL cholesterol goal below 70 mg/dL. I also advised the patient to eat a healthy diet with plenty of whole grains, cereals, fruits and vegetables, exercise regularly and maintain ideal body weight.  Check MRI scan of the brain with and without contrast, EEG, neuropathy panel labs and EMG/NCV study.  The patient's paresthesias at the present time are not bothersome to justify medications but she will call me if needed.  Followup in the future with me in 3 months or call earlier if necessary.  Paresthesia Paresthesia is an abnormal burning or prickling sensation. It is usually felt in the hands, arms, legs, or feet. However, it may occur in any part of the body. Usually, paresthesia is not painful. It may feel like:  Tingling or numbness.  Pins and needles.  Skin crawling.  Buzzing.  Arms or legs falling asleep.  Itching. Paresthesia may occur without any clear cause, or it may be caused by:  Breathing too quickly (hyperventilation).  Pressure on a nerve.  An underlying medical condition.  Side effects of a medication.  Nutritional deficiencies.  Exposure to toxic chemicals. Most people experience temporary (transient) paresthesia at some time in their lives. For some people, it may be long-lasting (chronic) because of an underlying medical condition. If you have paresthesia that lasts a long time, you may need to be evaluated by your health care provider. Follow these instructions at  home: Alcohol use   Do not drink alcohol if: ? Your health care provider tells you not to drink. ? You are pregnant, may be pregnant, or are planning to become pregnant.  If you drink alcohol, limit how much you have: ? 0-1 drink a day for women. ? 0-2 drinks a day for men.  Be aware of how much alcohol is in your drink. In the [place] one drink equals one typical bottle of beer (12 oz), one-half glass of wine (5 oz), or one shot of hard liquor (1 oz). Nutrition  Eat a healthy diet. This includes: ? Eating foods that are high in fiber, such as fresh fruits and vegetables, whole grains, and beans. ? Limiting foods that are high in fat and processed sugars, such as fried or sweet foods. General instructions  Take over-the-counter and prescription medicines only as told by your health care provider.  Do not use any products that contain nicotine or tobacco, such as cigarettes and e-cigarettes. These can keep blood from reaching damaged nerves. If you need help quitting, ask your health care provider.  If you have diabetes, work closely with your health care provider to keep your blood sugar under control.  If you have numbness in your feet: ? Check every day for signs of injury or infection. Watch for redness, warmth, and swelling. ? Wear padded socks and comfortable shoes. These help protect your feet.  Keep all follow-up visits as told by your health care provider. This is important. Contact a health care provider if you:  Have paresthesia that gets worse or does not go away.  Have a burning or prickling feeling that gets  worse when you walk.  Have pain, cramps, or dizziness.  Develop a rash. Get help right away if you:  Feel weak.  Have trouble walking or moving.  Have problems with speech, understanding, or vision.  Feel confused.  Cannot control your bladder or bowel movements.  Have numbness after an injury.  Develop new weakness in an arm or  leg.  Faint. Summary  Paresthesia is an abnormal burning or prickling sensation that is usually felt in the hands, arms, legs, or feet. It may also occur in other parts of the body.  Paresthesia may occur without any clear cause, or it may be caused by breathing too quickly (hyperventilation), pressure on a nerve, an underlying medical condition, side effects of a medication, nutritional deficiencies, or exposure to toxic chemicals.  If you have paresthesia that lasts a long time, you may need to be evaluated by your health care provider. This information is not intended to replace advice given to you by your health care provider. Make sure you discuss any questions you have with your health care provider. Document Released: 06/23/2002 Document Revised: 07/12/2017 Document Reviewed: 07/12/2017 Elsevier Interactive Patient Education  2019 [employer].

## 2018-08-01 ENCOUNTER — Telehealth: Payer: Self-pay | Admitting: Neurology

## 2018-08-01 NOTE — Telephone Encounter (Signed)
Spoke to the patient she is aware.  °

## 2018-08-01 NOTE — Telephone Encounter (Signed)
[employer]/medicare/tricare order sent to GI. They will reach out to the pt to schedule.

## 2018-08-02 LAB — NEUROPATHY PANEL
A/G Ratio: 1 (ref 0.7–1.7)
ALPHA 1: 0.3 g/dL (ref 0.0–0.4)
ANGIO CONVERT ENZYME: 28 U/L (ref 14–82)
Albumin ELP: 3.6 g/dL (ref 2.9–4.4)
Alpha 2: 0.6 g/dL (ref 0.4–1.0)
Anti Nuclear Antibody(ANA): POSITIVE — AB
Beta: 1.1 g/dL (ref 0.7–1.3)
GLOBULIN, TOTAL: 3.5 g/dL (ref 2.2–3.9)
Gamma Globulin: 1.5 g/dL (ref 0.4–1.8)
Rhuematoid fact SerPl-aCnc: <10 IU/mL (ref 0.0–13.9)
SED RATE: 46 mm/h — AB (ref 0–40)
TSH: 2.06 u[IU]/mL (ref 0.450–4.500)
Total Protein: 7.1 g/dL (ref 6.0–8.5)
VITAMIN B 12: 1229 pg/mL (ref 232–1245)
Vit D, 25-Hydroxy: 39 ng/mL (ref 30.0–100.0)

## 2018-08-06 ENCOUNTER — Telehealth: Payer: Self-pay | Admitting: *Deleted

## 2018-08-06 NOTE — Telephone Encounter (Signed)
Spoke with pt. and reviewed below lab results. She verbalized understanding of same. Will call with the rest of her results once they are back./fim

## 2018-08-06 NOTE — Telephone Encounter (Signed)
-----   Message from Garvin Fila, MD sent at 08/02/2018  2:23 PM EST ----- Kindly inform patient that all lab results are not back yet but Vitamin b 12, thyroidhorone, test for sarcoid are normal. Test for lupus is positive but likely a false positive as she does not have prior h/o lupus

## 2018-08-14 ENCOUNTER — Other Ambulatory Visit: Payer: Medicare Other

## 2018-08-19 ENCOUNTER — Ambulatory Visit
Admission: RE | Admit: 2018-08-19 | Discharge: 2018-08-19 | Disposition: A | Discharge status: Home / Self Care | Payer: Medicare Other | Source: Ambulatory Visit | Attending: Neurology | Admitting: Neurology

## 2018-08-19 DIAGNOSIS — R202 Paresthesia of skin: Secondary | ICD-10-CM

## 2018-08-19 DIAGNOSIS — R4789 Other speech disturbances: Secondary | ICD-10-CM

## 2018-08-19 DIAGNOSIS — G459 Transient cerebral ischemic attack, unspecified: Secondary | ICD-10-CM

## 2018-08-19 MED ORDER — GADOBENATE DIMEGLUMINE 529 MG/ML IV SOLN
16.0000 mL | Freq: Once | INTRAVENOUS | Status: AC | PRN
  Administered 2018-08-19: 16 mL via INTRAVENOUS

## 2018-08-22 ENCOUNTER — Telehealth: Payer: Self-pay

## 2018-08-22 NOTE — Telephone Encounter (Signed)
I called patient that the MRI brain shows no new or worrisome findings. I stated it showed several old strokes that are unchanged from last MRI 05/25/2018. Pt verbalized understanding. ------

## 2018-08-22 NOTE — Telephone Encounter (Signed)
-----   Message from Garvin Fila, MD sent at 08/21/2018  8:47 PM EST ----- Kindly inform patient that MRI scan of brain shows no new or worrisome findings. Several old strokes are noted unchanged from last  MRI 05/25/2018

## 2018-08-29 ENCOUNTER — Encounter (INDEPENDENT_AMBULATORY_CARE_PROVIDER_SITE_OTHER): Payer: Medicare Other | Admitting: Diagnostic Neuroimaging

## 2018-08-29 ENCOUNTER — Ambulatory Visit (INDEPENDENT_AMBULATORY_CARE_PROVIDER_SITE_OTHER): Payer: Medicare Other | Admitting: Diagnostic Neuroimaging

## 2018-08-29 DIAGNOSIS — R202 Paresthesia of skin: Secondary | ICD-10-CM | POA: Diagnosis not present

## 2018-08-29 DIAGNOSIS — Z0289 Encounter for other administrative examinations: Secondary | ICD-10-CM

## 2018-08-29 NOTE — Procedures (Signed)
[facility]  NCS (NERVE CONDUCTION STUDY) WITH EMG (ELECTROMYOGRAPHY) REPORT   STUDY DATE: 08/29/18 PATIENT NAME: Tina Wade DOB: 31-Aug-1928 MRN: [id number]  ORDERING CLINICIAN: Antony Contras, MD   TECHNOLOGIST: Sherre Scarlet ELECTROMYOGRAPHER: Earlean Polka. Maisee Vollman, MD  CLINICAL INFORMATION: 85 year old female with numbness in the feet.  FINDINGS: NERVE CONDUCTION STUDY:  Bilateral peroneal motor responses have normal distal latencies, normal amplitudes, borderline conduction velocities.  Bilateral tibial motor responses are normal.  Bilateral tibial F wave latencies normal.  Bilateral sural and superficial peroneal sensory responses could not be obtained.   NEEDLE ELECTROMYOGRAPHY:  Needle examination of right lower extremity vastus medialis, tibialis anterior, gastrocnemius muscles are normal.    IMPRESSION:   Abnormal study demonstrating: - Severe axonal sensory neuropathy.    INTERPRETING PHYSICIAN:  Penni Bombard, MD Certified in Neurology, Neurophysiology and Neuroimaging  [facility] [address] [place], [place] [address] [phone]   [facility]    Nerve / Sites Muscle Latency Ref. Amplitude Ref. Rel Amp Segments Distance Velocity Ref. Area    ms ms mV mV %  cm m/s m/s mVms  R Peroneal - EDB     Ankle EDB 5.6 ?6.5 3.0 ?2.0 100 Ankle - EDB 9   12.5     Fib head EDB 12.5  3.1  106 Fib head - Ankle 29 42 ?44 13.2     Pop fossa EDB 14.8  3.2  101 Pop fossa - Fib head 10 43 ?44 12.9         Pop fossa - Ankle      L Peroneal - EDB     Ankle EDB 5.6 ?6.5 3.8 ?2.0 100 Ankle - EDB 9   14.0     Fib head EDB 12.9  3.6  95.9 Fib head - Ankle 30 41 ?44 14.2     Pop fossa EDB 15.4  3.5  97.2 Pop fossa - Fib head 10 40 ?44 14.0         Pop fossa - Ankle      R Tibial - AH     Ankle AH 4.5 ?5.8 5.2 ?4.0 100 Ankle - AH 9   10.5     Pop fossa AH 14.0  3.2  61.5 Pop fossa - Ankle 39 41 ?41 7.0  L Tibial - AH   Ankle AH 4.8 ?5.8 4.7 ?4.0 100 Ankle - AH 9   14.3     Pop fossa AH 14.1  3.3  70.5 Pop fossa - Ankle 38 41 ?41 12.2             SNC    Nerve / Sites Rec. Site Peak Lat Ref.  Amp Ref. Segments Distance    ms ms V V  cm  R Sural - Ankle (Calf)     Calf Ankle NR ?4.4 NR ?6 Calf - Ankle 14  L Sural - Ankle (Calf)     Calf Ankle NR ?4.4 NR ?6 Calf - Ankle 14  R Superficial peroneal - Ankle     Lat leg Ankle NR ?4.4 NR ?6 Lat leg - Ankle 14  L Superficial peroneal - Ankle     Lat leg Ankle NR ?4.4 NR ?6 Lat leg - Ankle 14              F  Wave    Nerve F Lat Ref.   ms ms  R Tibial - AH 52.6 ?56.0  L Tibial - AH  54.5 ?56.0         EMG full       EMG Summary Table    Spontaneous MUAP Recruitment  Muscle IA Fib PSW Fasc Other Amp Dur. Poly Pattern  R. Vastus medialis Normal None None None _______ Normal Normal Normal Normal  R. Tibialis anterior Normal None None None _______ Normal Normal Normal Normal  R. Gastrocnemius (Medial head) Normal None None None _______ Normal Normal Normal Normal

## 2018-09-02 ENCOUNTER — Ambulatory Visit (INDEPENDENT_AMBULATORY_CARE_PROVIDER_SITE_OTHER): Payer: Medicare Other

## 2018-09-02 DIAGNOSIS — R4789 Other speech disturbances: Secondary | ICD-10-CM

## 2018-09-02 DIAGNOSIS — R4701 Aphasia: Secondary | ICD-10-CM

## 2018-09-02 DIAGNOSIS — G459 Transient cerebral ischemic attack, unspecified: Secondary | ICD-10-CM

## 2018-09-12 ENCOUNTER — Telehealth: Payer: Self-pay | Admitting: *Deleted

## 2018-09-12 NOTE — Telephone Encounter (Signed)
Notes recorded by Marval Regal, RN on 09/12/2018 at 10:18 AM EST I called patients daughter Marcene Brawn and gave normal results of EEG. She verbalized understanding. ------

## 2018-09-12 NOTE — Telephone Encounter (Signed)
-----   Message from Garvin Fila, MD sent at 09/11/2018  4:52 PM EST ----- Kindly inform the patient that EEG study was normal

## 2018-09-12 NOTE — Telephone Encounter (Signed)
Called, LVM for pt to call about results.  

## 2018-10-24 ENCOUNTER — Telehealth: Payer: Self-pay

## 2018-10-24 NOTE — Telephone Encounter (Signed)
I called pts daughter about her moms appt on 11/04/2018 with Dr.SEthi. I stated due to COVID 19 we are converting to virtual video visit. We are not seeing pts in office face to face. I receive consent from pts daughter Marcene Brawn to do video and to [employer]. Pts daughter also has a Iphone. She will do visit on Iphone. I gave Marcene Brawn the link of [email or web address] to download on her phone. I updated chart and med list.

## 2018-11-04 ENCOUNTER — Other Ambulatory Visit: Payer: Self-pay

## 2018-11-04 ENCOUNTER — Ambulatory Visit (INDEPENDENT_AMBULATORY_CARE_PROVIDER_SITE_OTHER): Payer: Medicare Other | Admitting: Neurology

## 2018-11-04 DIAGNOSIS — R202 Paresthesia of skin: Secondary | ICD-10-CM | POA: Diagnosis not present

## 2018-11-04 MED ORDER — GABAPENTIN 100 MG PO CAPS: 100.0000 mg | ORAL_CAPSULE | Freq: Every evening | ORAL | 2 refills | Status: DC | PRN

## 2018-11-04 NOTE — Progress Notes (Signed)
Virtual Visit via Video Note  I connected with Tina Wade on 11/04/18 at  3:00 PM EDT by a video enabled telemedicine application and verified that I am speaking with the correct person using two identifiers.  The patient was at her home with her daughter helping her.  I was in my office.   I discussed the limitations of evaluation and management by telemedicine and the availability of in person appointments. The patient expressed understanding and agreed to proceed.  History of Present Illness: Ms. Pirie is seen today for virtual video visit following her last office visit with me on 07/31/2018.  She states she is done well since that visit.  She has had no recurrent stroke, TIA or transient speech episodes.  She remains on warfarin which she is tolerating well and her INR has been stable.  She complains of increasing pain and paresthesias in her feet particularly at night.  Couple of nights a week she has to get up and take extra strength Tylenol which helps her go back to sleep.  Continues to have mild gait and balance difficulties but has not had any falls or injuries.  She underwent diagnostic testing which had advised at last visit.  Neuropathy panel labs on 07/31/2018 were all negative except for positive ANA and minimally elevated ESR of 46.  Patient has no history suggestive of lupus and hence the positive ANA is likely false positive incidental finding.  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  EEG done on 09/09/2018 was normal.  MRI scan of the brain done on 08/19/2018 showed old right frontal, bilateral cerebellar and left thalamic lacunar infarcts of remote age.  There is mild age-related changes of atrophy and small vessel disease.  No significant change compared with previous MRI.  Patient states she has no new complaints.    Observations/Objective: Exam was limited due to constraints of video visit.  She is pleasant awake alert cooperative.  She is  is hard of hearing.  Speech and language appeared normal.  Extraocular movements are full range.  Face is symmetric.  She was able to move upper extremities well.  Detailed lower extremity exam was not possible.   Assessment and Plan: 85 year old lady with episode of transient slurred speech and facial droop in November 2019 likely TIA due to small vessel disease.  She also has mechanical heart valve and is on long-term anticoagulation.  Multiple vascular risk factors of mechanical heart valve, chronic atrial fibrillation, hypertension and hyperlipidemia.  History of recurrent episodes of transient speech disturbance of unclear etiology.  Lower extremity paresthesias secondary to axonal polyneuropathy of unknown etiology.   Follow Up Instructions: I recommend she try gabapentin 100 mg at night to help with nocturnal paresthesias.  She may repeat a second dose if necessary.  I discussed possible side effects with the patient and daughter and asked him to call me if needed.  I explained to her the axonal neuropathy is likely of idiopathic etiology.  Possibility of paraneoplastic etiology with malignancy is a consideration but given the fact that she has not lost any weight and is doing well I would hold off on testing that at the present time.Continue warfarin daily  for secondary stroke prevention for her AFIB and mechanical heart valve with INR goal 3-3.5 and maintain strict control of hypertension with blood pressure goal below 130/90, diabetes with hemoglobin A1c goal below 6.5% and lipids with LDL cholesterol goal below 70 mg/dL.  She was advised  to get up slowly and walk carefully avoid falls and injuries.   She was advised to follow-up at office visit in 3 months after the [place] pandemic was over or call earlier if necessary I discussed the assessment and treatment plan with the patient. The patient was provided an opportunity to ask questions and all were answered. The patient agreed with the plan and  demonstrated an understanding of the instructions.   The patient was advised to call back or seek an in-person evaluation if the symptoms worsen or if the condition fails to improve as anticipated.  I provided 30 minutes of non-face-to-face time during this encounter.   Antony Contras, MD

## 2018-11-07 DIAGNOSIS — R0600 Dyspnea, unspecified: Secondary | ICD-10-CM | POA: Insufficient documentation

## 2018-11-07 DIAGNOSIS — R0609 Other forms of dyspnea: Secondary | ICD-10-CM | POA: Insufficient documentation

## 2018-11-11 MED ORDER — POTASSIUM CHLORIDE CRYS ER 20 MEQ PO TBCR
40.00 | EXTENDED_RELEASE_TABLET | ORAL | Status: DC
Start: 2018-11-12 — End: 2018-11-11

## 2018-11-11 MED ORDER — HYDROCODONE-ACETAMINOPHEN 5-325 MG PO TABS
1.00 | ORAL_TABLET | ORAL | Status: DC
Start: ? — End: 2018-11-11

## 2018-11-11 MED ORDER — THERA PO TABS
1.00 | ORAL_TABLET | ORAL | Status: DC
Start: 2018-11-12 — End: 2018-11-11

## 2018-11-11 MED ORDER — OXYMETAZOLINE HCL 0.05 % NA SOLN
2.00 | NASAL | Status: DC
Start: ? — End: 2018-11-11

## 2018-11-11 MED ORDER — ATORVASTATIN CALCIUM 40 MG PO TABS
40.00 | ORAL_TABLET | ORAL | Status: DC
Start: 2018-11-11 — End: 2018-11-11

## 2018-11-11 MED ORDER — GENERIC EXTERNAL MEDICATION
Status: DC
Start: ? — End: 2018-11-11

## 2018-11-11 MED ORDER — SODIUM CHLORIDE 0.9 % IV SOLN
10.00 | INTRAVENOUS | Status: DC
Start: ? — End: 2018-11-11

## 2018-11-11 MED ORDER — LEVOTHYROXINE SODIUM 50 MCG PO TABS
100.00 | ORAL_TABLET | ORAL | Status: DC
Start: 2018-11-12 — End: 2018-11-11

## 2018-11-11 MED ORDER — UMECLIDINIUM-VILANTEROL 62.5-25 MCG/INH IN AEPB
1.00 | INHALATION_SPRAY | RESPIRATORY_TRACT | Status: DC
Start: 2018-11-11 — End: 2018-11-11

## 2018-11-11 MED ORDER — NITROGLYCERIN 0.4 MG SL SUBL
.40 | SUBLINGUAL_TABLET | SUBLINGUAL | Status: DC
Start: ? — End: 2018-11-11

## 2018-11-11 MED ORDER — MUPIROCIN 2 % EX OINT
TOPICAL_OINTMENT | CUTANEOUS | Status: DC
Start: 2018-11-11 — End: 2018-11-11

## 2018-11-11 MED ORDER — FUROSEMIDE 40 MG PO TABS
40.00 | ORAL_TABLET | ORAL | Status: DC
Start: 2018-11-12 — End: 2018-11-11

## 2018-11-11 MED ORDER — ALBUTEROL SULFATE (2.5 MG/3ML) 0.083% IN NEBU
2.50 | INHALATION_SOLUTION | RESPIRATORY_TRACT | Status: DC
Start: ? — End: 2018-11-11

## 2018-11-11 MED ORDER — SODIUM CHLORIDE 0.9 % IV SOLN
75.00 | INTRAVENOUS | Status: DC
Start: ? — End: 2018-11-11

## 2018-11-11 MED ORDER — MONTELUKAST SODIUM 10 MG PO TABS
10.00 | ORAL_TABLET | ORAL | Status: DC
Start: 2018-11-11 — End: 2018-11-11

## 2018-11-11 MED ORDER — HYDROCODONE-ACETAMINOPHEN 10-325 MG PO TABS
1.00 | ORAL_TABLET | ORAL | Status: DC
Start: ? — End: 2018-11-11

## 2018-11-11 MED ORDER — CALCIUM CARBONATE-VITAMIN D 600-400 MG-UNIT PO TABS
1.00 | ORAL_TABLET | ORAL | Status: DC
Start: 2018-11-11 — End: 2018-11-11

## 2018-11-11 MED ORDER — ACETAMINOPHEN 325 MG PO TABS
650.00 | ORAL_TABLET | ORAL | Status: DC
Start: ? — End: 2018-11-11

## 2018-11-12 ENCOUNTER — Ambulatory Visit: Payer: Medicare Other | Admitting: Cardiology

## 2018-11-20 DIAGNOSIS — I350 Nonrheumatic aortic (valve) stenosis: Secondary | ICD-10-CM | POA: Insufficient documentation

## 2019-02-11 ENCOUNTER — Ambulatory Visit: Payer: Medicare Other | Admitting: Neurology

## 2019-02-22 ENCOUNTER — Inpatient Hospital Stay (HOSPITAL_COMMUNITY)
Admission: EM | Admit: 2019-02-22 | Discharge: 2019-02-27 | DRG: 481 | Disposition: A | Discharge status: Home Health | Payer: Medicare Other | Attending: Internal Medicine | Admitting: Internal Medicine

## 2019-02-22 ENCOUNTER — Encounter (HOSPITAL_COMMUNITY): Payer: Self-pay | Admitting: Internal Medicine

## 2019-02-22 ENCOUNTER — Emergency Department (HOSPITAL_COMMUNITY): Payer: Medicare Other

## 2019-02-22 ENCOUNTER — Inpatient Hospital Stay (HOSPITAL_COMMUNITY): Payer: Medicare Other

## 2019-02-22 DIAGNOSIS — D649 Anemia, unspecified: Secondary | ICD-10-CM

## 2019-02-22 DIAGNOSIS — T148XXA Other injury of unspecified body region, initial encounter: Secondary | ICD-10-CM

## 2019-02-22 DIAGNOSIS — Z7901 Long term (current) use of anticoagulants: Secondary | ICD-10-CM

## 2019-02-22 DIAGNOSIS — Y9301 Activity, walking, marching and hiking: Secondary | ICD-10-CM | POA: Diagnosis present

## 2019-02-22 DIAGNOSIS — E039 Hypothyroidism, unspecified: Secondary | ICD-10-CM | POA: Diagnosis present

## 2019-02-22 DIAGNOSIS — E875 Hyperkalemia: Secondary | ICD-10-CM | POA: Diagnosis not present

## 2019-02-22 DIAGNOSIS — N139 Obstructive and reflux uropathy, unspecified: Secondary | ICD-10-CM | POA: Diagnosis not present

## 2019-02-22 DIAGNOSIS — I482 Chronic atrial fibrillation, unspecified: Secondary | ICD-10-CM | POA: Diagnosis present

## 2019-02-22 DIAGNOSIS — Z952 Presence of prosthetic heart valve: Secondary | ICD-10-CM | POA: Diagnosis not present

## 2019-02-22 DIAGNOSIS — Z831 Family history of other infectious and parasitic diseases: Secondary | ICD-10-CM | POA: Diagnosis not present

## 2019-02-22 DIAGNOSIS — J449 Chronic obstructive pulmonary disease, unspecified: Secondary | ICD-10-CM | POA: Diagnosis not present

## 2019-02-22 DIAGNOSIS — Z66 Do not resuscitate: Secondary | ICD-10-CM | POA: Diagnosis present

## 2019-02-22 DIAGNOSIS — Y92009 Unspecified place in unspecified non-institutional (private) residence as the place of occurrence of the external cause: Secondary | ICD-10-CM | POA: Diagnosis not present

## 2019-02-22 DIAGNOSIS — Z8249 Family history of ischemic heart disease and other diseases of the circulatory system: Secondary | ICD-10-CM | POA: Diagnosis not present

## 2019-02-22 DIAGNOSIS — T50905A Adverse effect of unspecified drugs, medicaments and biological substances, initial encounter: Secondary | ICD-10-CM | POA: Diagnosis not present

## 2019-02-22 DIAGNOSIS — Z8673 Personal history of transient ischemic attack (TIA), and cerebral infarction without residual deficits: Secondary | ICD-10-CM

## 2019-02-22 DIAGNOSIS — Z79890 Hormone replacement therapy (postmenopausal): Secondary | ICD-10-CM

## 2019-02-22 DIAGNOSIS — Z79899 Other long term (current) drug therapy: Secondary | ICD-10-CM

## 2019-02-22 DIAGNOSIS — R0902 Hypoxemia: Secondary | ICD-10-CM | POA: Diagnosis not present

## 2019-02-22 DIAGNOSIS — D62 Acute posthemorrhagic anemia: Secondary | ICD-10-CM | POA: Diagnosis not present

## 2019-02-22 DIAGNOSIS — W010XXA Fall on same level from slipping, tripping and stumbling without subsequent striking against object, initial encounter: Secondary | ICD-10-CM | POA: Diagnosis present

## 2019-02-22 DIAGNOSIS — E785 Hyperlipidemia, unspecified: Secondary | ICD-10-CM | POA: Diagnosis present

## 2019-02-22 DIAGNOSIS — Z7982 Long term (current) use of aspirin: Secondary | ICD-10-CM | POA: Diagnosis not present

## 2019-02-22 DIAGNOSIS — E7800 Pure hypercholesterolemia, unspecified: Secondary | ICD-10-CM | POA: Diagnosis present

## 2019-02-22 DIAGNOSIS — Z20828 Contact with and (suspected) exposure to other viral communicable diseases: Secondary | ICD-10-CM | POA: Diagnosis not present

## 2019-02-22 DIAGNOSIS — S72002A Fracture of unspecified part of neck of left femur, initial encounter for closed fracture: Secondary | ICD-10-CM

## 2019-02-22 DIAGNOSIS — I10 Essential (primary) hypertension: Secondary | ICD-10-CM | POA: Diagnosis not present

## 2019-02-22 DIAGNOSIS — M25552 Pain in left hip: Secondary | ICD-10-CM | POA: Diagnosis present

## 2019-02-22 DIAGNOSIS — S72012A Unspecified intracapsular fracture of left femur, initial encounter for closed fracture: Secondary | ICD-10-CM | POA: Diagnosis not present

## 2019-02-22 DIAGNOSIS — F17210 Nicotine dependence, cigarettes, uncomplicated: Secondary | ICD-10-CM | POA: Diagnosis not present

## 2019-02-22 LAB — BASIC METABOLIC PANEL
Anion gap: 10 (ref 5–15)
BUN: 19 mg/dL (ref 8–23)
CO2: 26 mmol/L (ref 22–32)
Calcium: 9.1 mg/dL (ref 8.9–10.3)
Chloride: 101 mmol/L (ref 98–111)
Creatinine, Ser: 0.78 mg/dL (ref 0.44–1.00)
GFR calc Af Amer: >60 mL/min (ref >60)
GFR calc non Af Amer: >60 mL/min (ref >60)
Glucose, Bld: 152 mg/dL — ABNORMAL HIGH (ref 70–99)
Potassium: 4 mmol/L (ref 3.5–5.1)
Sodium: 137 mmol/L (ref 135–145)

## 2019-02-22 LAB — CBC WITH DIFFERENTIAL/PLATELET
Abs Immature Granulocytes: 0.08 10*3/uL — ABNORMAL HIGH (ref 0.00–0.07)
Basophils Absolute: 0 10*3/uL (ref 0.0–0.1)
Basophils Relative: 0 %
Eosinophils Absolute: 0.1 10*3/uL (ref 0.0–0.5)
Eosinophils Relative: 1 %
HCT: 40 % (ref 36.0–46.0)
Hemoglobin: 11.7 g/dL — ABNORMAL LOW (ref 12.0–15.0)
Immature Granulocytes: 1 %
Lymphocytes Relative: 11 %
Lymphs Abs: 1.1 10*3/uL (ref 0.7–4.0)
MCH: 27.7 pg (ref 26.0–34.0)
MCHC: 29.3 g/dL — ABNORMAL LOW (ref 30.0–36.0)
MCV: 94.8 fL (ref 80.0–100.0)
Monocytes Absolute: 0.7 10*3/uL (ref 0.1–1.0)
Monocytes Relative: 7 %
Neutro Abs: 8 10*3/uL — ABNORMAL HIGH (ref 1.7–7.7)
Neutrophils Relative %: 80 %
Platelets: 192 10*3/uL (ref 150–400)
RBC: 4.22 MIL/uL (ref 3.87–5.11)
RDW: 15.2 % (ref 11.5–15.5)
WBC: 9.9 10*3/uL (ref 4.0–10.5)
nRBC: 0 % (ref 0.0–0.2)

## 2019-02-22 LAB — SARS CORONAVIRUS 2 BY RT PCR (HOSPITAL ORDER, PERFORMED IN ~~LOC~~ HOSPITAL LAB): SARS Coronavirus 2: NEGATIVE

## 2019-02-22 LAB — PROTIME-INR
INR: 2.6 — ABNORMAL HIGH (ref 0.8–1.2)
Prothrombin Time: 27.2 seconds — ABNORMAL HIGH (ref 11.4–15.2)

## 2019-02-22 MED ORDER — MORPHINE SULFATE (PF) 4 MG/ML IV SOLN
4.0000 mg | Freq: Once | INTRAVENOUS | Status: AC
  Administered 2019-02-22: 4 mg via INTRAVENOUS
  Filled 2019-02-22: qty 1

## 2019-02-22 MED ORDER — ARFORMOTEROL TARTRATE 15 MCG/2ML IN NEBU
15.0000 ug | INHALATION_SOLUTION | Freq: Two times a day (BID) | RESPIRATORY_TRACT | Status: DC
  Administered 2019-02-22 – 2019-02-27 (×9): 15 ug via RESPIRATORY_TRACT
  Filled 2019-02-22 (×9): qty 2

## 2019-02-22 MED ORDER — HYDROCODONE-ACETAMINOPHEN 5-325 MG PO TABS
1.0000 | ORAL_TABLET | Freq: Four times a day (QID) | ORAL | Status: DC | PRN
  Administered 2019-02-22: 2 via ORAL
  Administered 2019-02-22 – 2019-02-24 (×4): 1 via ORAL
  Administered 2019-02-25: 2 via ORAL
  Administered 2019-02-26 – 2019-02-27 (×2): 1 via ORAL
  Filled 2019-02-22 (×6): qty 1
  Filled 2019-02-22 (×3): qty 2

## 2019-02-22 MED ORDER — ROSUVASTATIN CALCIUM 5 MG PO TABS
10.0000 mg | ORAL_TABLET | Freq: Every day | ORAL | Status: DC
  Administered 2019-02-22 – 2019-02-27 (×5): 10 mg via ORAL
  Filled 2019-02-22 (×5): qty 2

## 2019-02-22 MED ORDER — CALCIUM CARBONATE 1250 (500 CA) MG PO TABS
500.0000 mg | ORAL_TABLET | Freq: Two times a day (BID) | ORAL | Status: DC
  Administered 2019-02-22 – 2019-02-27 (×9): 500 mg via ORAL
  Filled 2019-02-22 (×9): qty 1

## 2019-02-22 MED ORDER — UMECLIDINIUM BROMIDE 62.5 MCG/INH IN AEPB
1.0000 | INHALATION_SPRAY | Freq: Every day | RESPIRATORY_TRACT | Status: DC
  Administered 2019-02-23 – 2019-02-27 (×5): 1 via RESPIRATORY_TRACT
  Filled 2019-02-22: qty 7

## 2019-02-22 MED ORDER — ADULT MULTIVITAMIN W/MINERALS CH
1.0000 | ORAL_TABLET | Freq: Every day | ORAL | Status: DC
  Administered 2019-02-22 – 2019-02-27 (×5): 1 via ORAL
  Filled 2019-02-22 (×5): qty 1

## 2019-02-22 MED ORDER — CALCIUM CARBONATE 1500 (600 CA) MG PO TABS
600.0000 mg | ORAL_TABLET | Freq: Two times a day (BID) | ORAL | Status: DC
  Filled 2019-02-22 (×2): qty 1

## 2019-02-22 MED ORDER — MORPHINE SULFATE (PF) 2 MG/ML IV SOLN
0.5000 mg | INTRAVENOUS | Status: DC | PRN
  Administered 2019-02-22 (×2): 0.5 mg via INTRAVENOUS
  Filled 2019-02-22 (×2): qty 1

## 2019-02-22 MED ORDER — GABAPENTIN 100 MG PO CAPS
100.0000 mg | ORAL_CAPSULE | Freq: Every evening | ORAL | Status: DC | PRN
  Administered 2019-02-22 – 2019-02-26 (×10): 100 mg via ORAL
  Filled 2019-02-22 (×10): qty 1

## 2019-02-22 MED ORDER — MONTELUKAST SODIUM 10 MG PO TABS
10.0000 mg | ORAL_TABLET | Freq: Every day | ORAL | Status: DC
  Administered 2019-02-22 – 2019-02-26 (×5): 10 mg via ORAL
  Filled 2019-02-22 (×5): qty 1

## 2019-02-22 MED ORDER — ONDANSETRON HCL 4 MG/2ML IJ SOLN
4.0000 mg | Freq: Once | INTRAMUSCULAR | Status: AC
  Administered 2019-02-22: 4 mg via INTRAVENOUS
  Filled 2019-02-22: qty 2

## 2019-02-22 MED ORDER — CALCIUM CARBONATE ANTACID 500 MG PO CHEW
400.0000 mg | CHEWABLE_TABLET | Freq: Once | ORAL | Status: AC
  Administered 2019-02-23: 400 mg via ORAL
  Filled 2019-02-22: qty 2

## 2019-02-22 MED ORDER — LOSARTAN POTASSIUM 50 MG PO TABS
25.0000 mg | ORAL_TABLET | Freq: Every day | ORAL | Status: DC
  Administered 2019-02-22 – 2019-02-27 (×5): 25 mg via ORAL
  Filled 2019-02-22 (×5): qty 1

## 2019-02-22 MED ORDER — ENSURE ENLIVE PO LIQD
237.0000 mL | Freq: Two times a day (BID) | ORAL | Status: DC
  Administered 2019-02-22 – 2019-02-27 (×9): 237 mL via ORAL

## 2019-02-22 MED ORDER — LEVOTHYROXINE SODIUM 112 MCG PO TABS
112.0000 ug | ORAL_TABLET | Freq: Every day | ORAL | Status: DC
  Administered 2019-02-23 – 2019-02-27 (×5): 112 ug via ORAL
  Filled 2019-02-22 (×5): qty 1

## 2019-02-22 MED ORDER — LEVOTHYROXINE SODIUM 100 MCG PO TABS: 100.0000 ug | ORAL_TABLET | Freq: Every day | ORAL | Status: DC

## 2019-02-22 NOTE — ED Provider Notes (Signed)
[facility] EMERGENCY DEPARTMENT Provider Note   CSN: [id number] Arrival date & time: 02/22/19  0110    History   Chief Complaint Chief Complaint  Patient presents with   Fall    HPI Tina Wade is a 85 y.o. female.   The history is provided by the patient.  She has a history of hypertension, hyperlipidemia, stroke, mitral valve replacement anticoagulated on warfarin and comes in following a fall at home.  She states that she slipped on her deck and landed on her left side.  She is complaining of pain in her left hip.  She was able to get up with assistance but was unable to bear any weight.  She is currently rating her pain at 5/10.  She denies any head injury.  Past Medical History:  Diagnosis Date   Arrhythmia    chronic atrial fib. with controlled rate ,on warfarin therapy   High cholesterol    Hypertension    Lower extremity edema    mild -stable   Mitral valve stenosis 10/1994   s/p St. Jude mechanical valve placement -34M-101 MODEL   Stroke ([place]) 08/2010   from office visit 2013- the setting of normal INR of 2.8 ;therefore increased goal to 3.5   Thyroid disease    hypothyroidism on synthroid    Patient Active Problem List   Diagnosis Date Noted   TIA (transient ischemic attack) 05/24/2018   HLD (hyperlipidemia) 05/24/2018   Tobacco use 05/24/2018   Reactive airway disease 05/24/2018   Bilateral lower extremity edema - mild 05/02/2013   Cigarette smoker one half pack a day or less unmotivated he quit -  05/02/2013   Chronic atrial fibrillation 04/30/2013   Essential hypertension 04/30/2013   Long term (current) use of anticoagulants 10/03/2012   CVA (cerebral vascular accident) ([place]) 10/03/2012   H/O mitral valve replacement 10/03/2012    Past Surgical History:  Procedure Laterality Date   CAROTID DOPPLER  04/25/2012   right ICA SMALL AMT FIBROUS PLAQUE;LEFT BULB SMALL TO MODERATE AMT OF MIXED DENSITY PLAQUE  50-69%; LEFT ICA 0-49%   DOPPLER ECHOCARDIOGRAPHY  09/13/2011   mild concentric LVH ,EF 45-50%,mild to moderate dilated right ventricle ,moderately dilated left atrium,prosthetic vlve noemal geadients and normal functioning bileaflet valve.The aortic valve had only trace regurg. and was mildly sclerotic and the septal wall motion with post-op state makes the echo EF difficult to assess   MITRAL VALVE SURGERY  10/1994   NM [place]  03/08/2004   done in [place]-----adenosine--normal study,no ischemia   TRANSESOPHAGEAL ECHOCARDIOGRAM  08/2010   showed mild concentric LVH, EF 50-60% with moderate aortic regurg,calcified mitral annnulus with mechanical  prosthesis well functioning smoke inthe left atrial cavity noted;mod. to severe dilated. this when she was diagnosise with afib did not undergo cardioversion     OB History   No obstetric history on file.      Home Medications    Prior to Admission medications   Medication Sig Start Date End Date Taking? Authorizing Provider  aspirin 81 MG tablet Take 81 mg by mouth daily.      [provider]  calcium carbonate (OS-CAL) 600 MG TABS tablet Take 600 mg by mouth 2 (two) times daily with a meal.    [provider]  furosemide (LASIX) 40 MG tablet Take 40 mg by mouth daily.     [provider]  gabapentin (NEURONTIN) 100 MG capsule Take 1 capsule (100 mg total) by mouth  at bedtime and may repeat dose one time if needed. 11/04/18 11/04/19  Garvin Fila, MD  levothyroxine (SYNTHROID, LEVOTHROID) 100 MCG tablet Take 100 mcg by mouth daily.     [provider]  losartan (COZAAR) 25 MG tablet Take 1 tablet (25 mg total) by mouth daily. 03/27/14   Leonie Man, MD  montelukast (SINGULAIR) 10 MG tablet Take 10 mg by mouth at bedtime.    [provider]  rosuvastatin (CRESTOR) 10 MG tablet Take 10 mg by mouth daily. 02/14/18   [provider]  Tiotropium Bromide-Olodaterol (STIOLTO  RESPIMAT) 2.5-2.5 MCG/ACT AERS USE 2 INHALATIONS DAILY 06/17/18   [provider]  warfarin (COUMADIN) 5 MG tablet TAKE ONE TO ONE AND ONE-HALF TABLETS DAILY OR AS DIRECTED Patient taking differently: Take 5-7.5 mg by mouth daily.     [place], Casimiro Needle, RPH-CPP    Family History Family History  Problem Relation Age of Onset   Heart disease Mother    Tuberculosis Father     Social History Social History   Tobacco Use   Smoking status: Current Every Day Smoker    Packs/day: 0.25    Types: Cigarettes   Smokeless tobacco: Never Used   Tobacco comment: one pack every  3 days  Substance Use Topics   Alcohol use: No   Drug use: No     Allergies   Patient has no known allergies.   Review of Systems Review of Systems  All other systems reviewed and are negative.    Physical Exam Updated Vital Signs BP (!) 182/91 (BP Location: Right Arm)    Pulse 76    Temp 98 F (36.7 C) (Oral)    Resp 18    Ht 5\' 6"  (1.676 m)    Wt 78 kg    SpO2 92%    BMI 27.76 kg/m   Physical Exam Vitals signs and nursing note reviewed.    85 year old female, resting comfortably and in no acute distress. Vital signs are significant for elevated blood pressure. Oxygen saturation is 92%, which is normal. Head is normocephalic and atraumatic. PERRLA, EOMI. Oropharynx is clear. Neck is nontender and supple without adenopathy or JVD. Back is nontender and there is no CVA tenderness. Lungs are clear without rales, wheezes, or rhonchi. Chest is nontender. Heart has regular rate and rhythm without murmur. Abdomen is soft, flat, nontender without masses or hepatosplenomegaly and peristalsis is normoactive. Pelvis is nontender and stable. Extremities have no cyanosis or edema, full range of motion is present.  There is no tenderness to palpation of the left hip, but there is marked pain with any passive movement of the left hip.  There is no shortening or rotation of her leg. Skin is warm and  dry without rash. Neurologic: Mental status is normal, cranial nerves are intact, there are no motor or sensory deficits.  ED Treatments / Results  Labs (all labs ordered are listed, but only abnormal results are displayed) Labs Reviewed  BASIC METABOLIC PANEL - Abnormal; Notable for the following components:      Result Value   Glucose, Bld 152 (*)    All other components within normal limits  CBC WITH DIFFERENTIAL/PLATELET - Abnormal; Notable for the following components:   Hemoglobin 11.7 (*)    MCHC 29.3 (*)    Neutro Abs 8.0 (*)    Abs Immature Granulocytes 0.08 (*)    All other components within normal limits  PROTIME-INR - Abnormal; Notable for the following  components:   Prothrombin Time 27.2 (*)    INR 2.6 (*)    All other components within normal limits  SARS CORONAVIRUS 2 (HOSPITAL ORDER, [facility])  TYPE AND SCREEN    EKG EKG Interpretation  Date/Time:  Saturday February 22 2019 01:22:10 EDT Ventricular Rate:  75 PR Interval:    QRS Duration: 91 QT Interval:  403 QTC Calculation: 451 R Axis:   -19 Text Interpretation:  Atrial fibrillation Ventricular premature complex Borderline left axis deviation When compared with ECG of 05/24/2018, Premature ventricular complexes are now present Confirmed by Delora Fuel ([id number]) on 02/22/2019 1:32:26 AM   Radiology Dg Chest 1 View  Result Date: 02/22/2019 CLINICAL DATA:  Recent fall with chest pain, initial encounter EXAM: CHEST  1 VIEW COMPARISON:  01/10/2012 FINDINGS: Cardiac shadow is mildly enlarged but stable. Aortic calcifications are again seen and stable. The lungs are well aerated bilaterally. Mild right basilar atelectasis is seen. Postsurgical changes are noted. IMPRESSION: Mild right basilar atelectasis. Electronically Signed   By: Inez Catalina M.D.   On: 02/22/2019 02:05   Ct Head Wo Contrast  Result Date: 02/22/2019 CLINICAL DATA:  Recent fall with headaches and neck pain, initial  encounter EXAM: CT HEAD WITHOUT CONTRAST CT CERVICAL SPINE WITHOUT CONTRAST TECHNIQUE: Multidetector CT imaging of the head and cervical spine was performed following the standard protocol without intravenous contrast. Multiplanar CT image reconstructions of the cervical spine were also generated. COMPARISON:  05/24/2018 FINDINGS: CT HEAD FINDINGS Brain: Chronic atrophic changes and white matter ischemic changes are seen. Prior lacunar infarct in the medial aspect of the left temporal lobe is seen. Left thalamic lacunar infarct is noted as well. Changes are stable from the prior exam. No acute hemorrhage or acute infarction is seen. Vascular: No hyperdense vessel or unexpected calcification. Skull: Normal. Negative for fracture or focal lesion. Sinuses/Orbits: No acute finding. Other: None. CT CERVICAL SPINE FINDINGS Alignment: Within normal limits. Skull base and vertebrae: 7 cervical segments are well visualized. Vertebral body height is well maintained. Facet hypertrophic changes and osteophytic changes are seen. Disc space narrowing at C5-6 C6-7 is noted. No acute fracture or acute facet abnormality is noted Soft tissues and spinal canal: Surrounding soft tissue structures are within normal limits. Upper chest: Visualized lung apices are within normal limits. Other: None IMPRESSION: CT of the head: Chronic atrophic and ischemic changes. No acute abnormality noted. CT of the cervical spine: Multilevel degenerative change without acute abnormality. Electronically Signed   By: Inez Catalina M.D.   On: 02/22/2019 01:57   Ct Cervical Spine Wo Contrast  Result Date: 02/22/2019 CLINICAL DATA:  Recent fall with headaches and neck pain, initial encounter EXAM: CT HEAD WITHOUT CONTRAST CT CERVICAL SPINE WITHOUT CONTRAST TECHNIQUE: Multidetector CT imaging of the head and cervical spine was performed following the standard protocol without intravenous contrast. Multiplanar CT image reconstructions of the cervical spine  were also generated. COMPARISON:  05/24/2018 FINDINGS: CT HEAD FINDINGS Brain: Chronic atrophic changes and white matter ischemic changes are seen. Prior lacunar infarct in the medial aspect of the left temporal lobe is seen. Left thalamic lacunar infarct is noted as well. Changes are stable from the prior exam. No acute hemorrhage or acute infarction is seen. Vascular: No hyperdense vessel or unexpected calcification. Skull: Normal. Negative for fracture or focal lesion. Sinuses/Orbits: No acute finding. Other: None. CT CERVICAL SPINE FINDINGS Alignment: Within normal limits. Skull base and vertebrae: 7 cervical segments are well visualized. Vertebral body  height is well maintained. Facet hypertrophic changes and osteophytic changes are seen. Disc space narrowing at C5-6 C6-7 is noted. No acute fracture or acute facet abnormality is noted Soft tissues and spinal canal: Surrounding soft tissue structures are within normal limits. Upper chest: Visualized lung apices are within normal limits. Other: None IMPRESSION: CT of the head: Chronic atrophic and ischemic changes. No acute abnormality noted. CT of the cervical spine: Multilevel degenerative change without acute abnormality. Electronically Signed   By: Inez Catalina M.D.   On: 02/22/2019 01:57   Dg Hip Unilat With Pelvis 2-3 Views Left  Result Date: 02/22/2019 CLINICAL DATA:  Recent fall with left hip pain, initial encounter EXAM: DG HIP (WITH OR WITHOUT PELVIS) 2-3V LEFT COMPARISON:  None. FINDINGS: Pelvic ring is intact. There is a subcapital left femoral neck fracture with mild impaction at the fracture site. No other focal abnormality is noted. IMPRESSION: Subcapital left femoral neck fracture. Electronically Signed   By: Inez Catalina M.D.   On: 02/22/2019 02:06    Procedures Procedures  Medications Ordered in ED Medications  ondansetron (ZOFRAN) injection 4 mg (4 mg Intravenous Given 02/22/19 0224)  morphine 4 MG/ML injection 4 mg (4 mg Intravenous  Given 02/22/19 0256)     Initial Impression / Assessment and Plan / ED Course  I have reviewed the triage vital signs and the nursing notes.  Pertinent labs & imaging results that were available during my care of the patient were reviewed by me and considered in my medical decision making (see chart for details).  Fall with injury of left hip worrisome for fracture.  She will be sent for x-rays.  Because she is anticoagulated, will also get CT of head and cervical spine.  Old records are reviewed, and she has no relevant past visits.  ECG is significant only for occasional PVC.  Hip x-ray shows subcapital femoral neck fracture.  Chest x-ray shows atelectasis.  CT of head and cervical spine show no evidence of acute injury.  Labs show therapeutic INR, mild anemia probably secondary to blood loss from hip fracture.  Case is discussed with Dr. Hal Hope of [facility] hospitalist, who agrees to admit the patient.  Case is discussed with Dr. Doreatha Martin of orthopedic surgery who states he will see the patient in consultation, plan on surgical repair on August 9 to allow for correction of INR.Marland Kitchen  Final Clinical Impressions(s) / ED Diagnoses   Final diagnoses:  Closed fracture of neck of left femur, initial encounter ([place])  Anticoagulated on warfarin  Normochromic normocytic anemia    ED Discharge Orders    None       Delora Fuel, MD 24/23/53 [phone]

## 2019-02-22 NOTE — Consult Note (Signed)
Orthopaedic Trauma Service (OTS) Consult   Patient ID: Tina Wade MRN: [id number] DOB/AGE: 1928-12-06 85 y.o.  Reason for Consult:Left femoral neck fracture Referring Physician: Dr. Delora Fuel, MD [facility]  HPI: Tina Wade is an 85 y.o. female who is being seen in consultation at the request of Dr. Roxanne Mins for evaluation of left femoral neck fracture.  The patient has a history of a mechanical mitral valve hypertension atrial fibrillation on Coumadin who slipped and fell landed on her left side.  Had immediate pain and inability to bear weight.  X-ray showed a minimally displaced left femoral neck fracture.  She was admitted to the hospitalist for perioperative management and reversal of her INR.  Patient currently denies any other symptoms or any other pain.  She has left groin pain when she moves or tries to move her hip.  Numbness or tingling.  Lives at home with her daughter.  INR was 2.6 earlier this morning.  Past Medical History:  Diagnosis Date  . Arrhythmia    chronic atrial fib. with controlled rate ,on warfarin therapy  . High cholesterol   . Hypertension   . Lower extremity edema    mild -stable  . Mitral valve stenosis 10/1994   s/p St. Jude mechanical valve placement -40M-101 MODEL  . Stroke ([place]) 08/2010   from office visit 2013- the setting of normal INR of 2.8 ;therefore increased goal to 3.5  . Thyroid disease    hypothyroidism on synthroid    Past Surgical History:  Procedure Laterality Date  . CAROTID DOPPLER  04/25/2012   right ICA SMALL AMT FIBROUS PLAQUE;LEFT BULB SMALL TO MODERATE AMT OF MIXED DENSITY PLAQUE 50-69%; LEFT ICA 0-49%  . DOPPLER ECHOCARDIOGRAPHY  09/13/2011   mild concentric LVH ,EF 45-50%,mild to moderate dilated right ventricle ,moderately dilated left atrium,prosthetic vlve noemal geadients and normal functioning bileaflet valve.The aortic valve had only trace regurg. and was mildly sclerotic and the septal wall motion with post-op  state makes the echo EF difficult to assess  . MITRAL VALVE SURGERY  10/1994  . NM [place]OCAR PERF WALL MOTION  03/08/2004   done in [place]-----adenosine--normal study,no ischemia  . TRANSESOPHAGEAL ECHOCARDIOGRAM  08/2010   showed mild concentric LVH, EF 50-60% with moderate aortic regurg,calcified mitral annnulus with mechanical  prosthesis well functioning smoke inthe left atrial cavity noted;mod. to severe dilated. this when she was diagnosise with afib did not undergo cardioversion    Family History  Problem Relation Age of Onset  . Heart disease Mother   . Tuberculosis Father     Social History:  reports that she has been smoking cigarettes. She has been smoking about 0.25 packs per day. She has never used smokeless tobacco. She reports that she does not drink alcohol or use drugs.  Allergies: No Known Allergies  Medications:  No current facility-administered medications on file prior to encounter.    Current Outpatient Medications on File Prior to Encounter  Medication Sig Dispense Refill  . aspirin 81 MG tablet Take 81 mg by mouth daily.      . calcium carbonate (OS-CAL) 600 MG TABS tablet Take 600 mg by mouth 2 (two) times daily with a meal.    . furosemide (LASIX) 40 MG tablet Take 40 mg by mouth daily.     Marland Kitchen gabapentin (NEURONTIN) 100 MG capsule Take 1 capsule (100 mg total) by mouth at bedtime and may repeat dose one time if needed. 30 capsule 2  . levothyroxine (SYNTHROID, LEVOTHROID)  100 MCG tablet Take 100 mcg by mouth daily.     Marland Kitchen losartan (COZAAR) 25 MG tablet Take 1 tablet (25 mg total) by mouth daily. 90 tablet 2  . montelukast (SINGULAIR) 10 MG tablet Take 10 mg by mouth at bedtime.    . rosuvastatin (CRESTOR) 10 MG tablet Take 10 mg by mouth daily.    . Tiotropium Bromide-Olodaterol (STIOLTO RESPIMAT) 2.5-2.5 MCG/ACT AERS USE 2 INHALATIONS DAILY    . warfarin (COUMADIN) 5 MG tablet TAKE ONE TO ONE AND ONE-HALF TABLETS DAILY OR AS DIRECTED (Patient taking differently:  Take 5-7.5 mg by mouth daily. ) 110 tablet 1    ROS: Constitutional: No fever or chills Vision: No changes in vision ENT: No difficulty swallowing CV: No chest pain Pulm: No SOB or wheezing GI: No nausea or vomiting GU: No urgency or inability to hold urine Skin: No poor wound healing Neurologic: No numbness or tingling Psychiatric: No depression or anxiety Heme: No bruising Allergic: No reaction to medications or food   Exam: Blood pressure (!) 153/67, pulse 82, temperature 98.1 F (36.7 C), temperature source Oral, resp. rate 16, height 5\' 6"  (1.676 m), weight 78 kg, SpO2 94 %. General: No acute distress Orientation: Awake alert and oriented x3 Mood and Affect: Cooperative and pleasant Gait: Is unable to ambulate secondary to pain in her hip Coordination and balance: Within normal limits Regular rate. No increased work of breathing  Left lower extremity: No bruising or skin lesions.  Pain with any attempted range of motion of the hip.  Knee without effusion or injury.  No deformity of the ankle or foot.  Patient is able to actively dorsiflex and plantarflex her foot.  Sensation intact to light touch to all nerve distributions.  No lymphadenopathy.  She has warm well-perfused foot with brisk cap refill of less than 2 seconds.  Reflexes are within normal limits.  Leg lengths are symmetric to the contralateral side.  Right lower extremity: skin without lesions. No tenderness to palpation. Full painless ROM, full strength in each muscle groups without evidence of instability.   Medical Decision Making: Imaging: X-rays and CT scan of the left hip show a nondisplaced to slightly valgus impacted femoral neck fracture.  No significant displacement.   Labs:  Results for orders placed or performed during the hospital encounter of 02/22/19 (from the past 24 hour(s))  Basic metabolic panel     Status: Abnormal   Collection Time: 02/22/19  2:00 AM  Result Value Ref Range   Sodium 137  135 - 145 mmol/L   Potassium 4.0 3.5 - 5.1 mmol/L   Chloride 101 98 - 111 mmol/L   CO2 26 22 - 32 mmol/L   Glucose, Bld 152 (H) 70 - 99 mg/dL   BUN 19 8 - 23 mg/dL   Creatinine, Ser 0.78 0.44 - 1.00 mg/dL   Calcium 9.1 8.9 - 10.3 mg/dL   GFR calc non Af Amer >60 >60 mL/min   GFR calc Af Amer >60 >60 mL/min   Anion gap 10 5 - 15  CBC WITH DIFFERENTIAL     Status: Abnormal   Collection Time: 02/22/19  2:00 AM  Result Value Ref Range   WBC 9.9 4.0 - 10.5 K/uL   RBC 4.22 3.87 - 5.11 MIL/uL   Hemoglobin 11.7 (L) 12.0 - 15.0 g/dL   HCT 40.0 36.0 - 46.0 %   MCV 94.8 80.0 - 100.0 fL   MCH 27.7 26.0 - 34.0 pg   MCHC 29.3 (  L) 30.0 - 36.0 g/dL   RDW 15.2 11.5 - 15.5 %   Platelets 192 150 - 400 K/uL   nRBC 0.0 0.0 - 0.2 %   Neutrophils Relative % 80 %   Neutro Abs 8.0 (H) 1.7 - 7.7 K/uL   Lymphocytes Relative 11 %   Lymphs Abs 1.1 0.7 - 4.0 K/uL   Monocytes Relative 7 %   Monocytes Absolute 0.7 0.1 - 1.0 K/uL   Eosinophils Relative 1 %   Eosinophils Absolute 0.1 0.0 - 0.5 K/uL   Basophils Relative 0 %   Basophils Absolute 0.0 0.0 - 0.1 K/uL   Immature Granulocytes 1 %   Abs Immature Granulocytes 0.08 (H) 0.00 - 0.07 K/uL  Protime-INR     Status: Abnormal   Collection Time: 02/22/19  2:00 AM  Result Value Ref Range   Prothrombin Time 27.2 (H) 11.4 - 15.2 seconds   INR 2.6 (H) 0.8 - 1.2  Type and screen [place]     Status: None (Preliminary result)   Collection Time: 02/22/19  2:00 AM  Result Value Ref Range   ABO/RH(D) A POS    Antibody Screen POS    Sample Expiration 02/25/2019,2359    Antibody Identification ANTI E    PT AG Type      NEGATIVE FOR E ANTIGEN Performed at [facility], [place] [address], [place], [place] [address]    Unit Number [id number]    Blood Component Type RED CELLS,LR    Unit division 00    Status of Unit ALLOCATED    Donor AG Type NEGATIVE FOR E ANTIGEN    Transfusion Status OK TO TRANSFUSE    Crossmatch Result  COMPATIBLE   SARS Coronavirus 2 [facility] order, Performed in [facility] hospital lab) Nasopharyngeal Nasopharyngeal Swab     Status: None   Collection Time: 02/22/19  2:26 AM   Specimen: Nasopharyngeal Swab  Result Value Ref Range   SARS Coronavirus 2 NEGATIVE NEGATIVE    Medical history and chart was reviewed  Assessment/Plan: 85 year old female with a history of atrial fibrillation with mechanical heart valve on Coumadin with a ground-level fall and nondisplaced/minimally displaced left femoral neck fracture.  The patient has a relatively nondisplaced left femoral neck fracture.  I feel that percutaneous fixation with the cannulated screws would be most appropriate.  She does have some pre-existing arthritis however I do not feel that arthroplasty is warranted.  I also feel that due to her anticoagulation the percutaneous fixation would be less risks.  I did discuss risks and benefits with the patient and her daughter.  Risks included but not limited to bleeding, infection, malunion, nonunion, hardware failure, hardware cut out, progression of arthritis, need for total or partial hip replacement, nerve and blood vessel injury, DVT, loss of ambulatory capabilities.  The patient and her daughter agreed to proceed with surgery will plan for tomorrow morning.  N.p.o. after midnight.  Recheck INR in the morning.  However I do not feel that there is any need for delay after tomorrow.  Shona Needles, MD [facility] [phone] (phone) [phone] (office) [email or web address]

## 2019-02-22 NOTE — ED Notes (Signed)
ED TO INPATIENT HANDOFF REPORT  ED Nurse Name and Phone #: [phone], [place] RN  S Name/Age/Gender Tina Wade 85 y.o. female Room/Bed: 025C/025C  Code Status   Code Status: Prior  Home/SNF/Other Home Patient oriented to: self, place, time and situation Is this baseline? Yes   Triage Complete: Triage complete  Chief Complaint Fall; L Hip Pain  Triage Note Pt. Came in EMS c/o falling on the deck about 2hrs ago. Pain to the left leg and hip. 174mcg Fentanyl given in route. 20G IV hand. VS: 172/78, HR:92, RR12, 97.3 Temp. Pt is on coumadin.     Allergies No Known Allergies  Level of Care/Admitting Diagnosis ED Disposition    ED Disposition Condition Comment   Admit  Hospital Area: [facility] [[id number]]  Level of Care: Telemetry Medical [448]  Covid Evaluation: N/A  Diagnosis: Closed left hip fracture, initial encounter [facility]) [[id number]]  Admitting Physician: Rise Patience [phone]  Attending Physician: Rise Patience [phone]  Estimated length of stay: past midnight tomorrow  Certification:: I certify this patient will need inpatient services for at least 2 midnights  PT Class (Do Not Modify): Inpatient [101]  PT Acc Code (Do Not Modify): Private [1]       B Medical/Surgery History Past Medical History:  Diagnosis Date  . Arrhythmia    chronic atrial fib. with controlled rate ,on warfarin therapy  . High cholesterol   . Hypertension   . Lower extremity edema    mild -stable  . Mitral valve stenosis 10/1994   s/p [employer] Jude mechanical valve placement -30M-101 MODEL  . Stroke ([place]) 08/2010   from office visit 2013- the setting of normal INR of 2.8 ;therefore increased goal to 3.5  . Thyroid disease    hypothyroidism on synthroid   Past Surgical History:  Procedure Laterality Date  . CAROTID DOPPLER  04/25/2012   right ICA SMALL AMT FIBROUS PLAQUE;LEFT BULB SMALL TO MODERATE AMT OF MIXED DENSITY PLAQUE 50-69%; LEFT ICA 0-49%  .  DOPPLER ECHOCARDIOGRAPHY  09/13/2011   mild concentric LVH ,EF 45-50%,mild to moderate dilated right ventricle ,moderately dilated left atrium,prosthetic vlve noemal geadients and normal functioning bileaflet valve.The aortic valve had only trace regurg. and was mildly sclerotic and the septal wall motion with post-op state makes the echo EF difficult to assess  . MITRAL VALVE SURGERY  10/1994  . NM MYOCAR PERF WALL MOTION  03/08/2004   done in [place]-----adenosine--normal study,no ischemia  . TRANSESOPHAGEAL ECHOCARDIOGRAM  08/2010   showed mild concentric LVH, EF 50-60% with moderate aortic regurg,calcified mitral annnulus with mechanical  prosthesis well functioning smoke inthe left atrial cavity noted;mod. to severe dilated. this when she was diagnosise with afib did not undergo cardioversion     A IV Location/Drains/Wounds Patient Lines/Drains/Airways Status   Active Line/Drains/Airways    Name:   Placement date:   Placement time:   Site:   Days:   Peripheral IV 02/22/19 Left Hand   02/22/19    0128    Hand   less than 1          Intake/Output Last 24 hours No intake or output data in the 24 hours ending 02/22/19 8588  Labs/Imaging Results for orders placed or performed during the hospital encounter of 02/22/19 (from the past 48 hour(s))  Basic metabolic panel     Status: Abnormal   Collection Time: 02/22/19  2:00 AM  Result Value Ref Range   Sodium 137 135 - 145 mmol/L  Potassium 4.0 3.5 - 5.1 mmol/L   Chloride 101 98 - 111 mmol/L   CO2 26 22 - 32 mmol/L   Glucose, Bld 152 (H) 70 - 99 mg/dL   BUN 19 8 - 23 mg/dL   Creatinine, Ser 0.78 0.44 - 1.00 mg/dL   Calcium 9.1 8.9 - 10.3 mg/dL   GFR calc non Af Amer >60 >60 mL/min   GFR calc Af Amer >60 >60 mL/min   Anion gap 10 5 - 15    Comment: Performed at [facility] [address], [place], [place] [address]  CBC WITH DIFFERENTIAL     Status: Abnormal   Collection Time: 02/22/19  2:00 AM  Result Value Ref Range    WBC 9.9 4.0 - 10.5 K/uL   RBC 4.22 3.87 - 5.11 MIL/uL   Hemoglobin 11.7 (L) 12.0 - 15.0 g/dL   HCT 40.0 36.0 - 46.0 %   MCV 94.8 80.0 - 100.0 fL   MCH 27.7 26.0 - 34.0 pg   MCHC 29.3 (L) 30.0 - 36.0 g/dL   RDW 15.2 11.5 - 15.5 %   Platelets 192 150 - 400 K/uL   nRBC 0.0 0.0 - 0.2 %   Neutrophils Relative % 80 %   Neutro Abs 8.0 (H) 1.7 - 7.7 K/uL   Lymphocytes Relative 11 %   Lymphs Abs 1.1 0.7 - 4.0 K/uL   Monocytes Relative 7 %   Monocytes Absolute 0.7 0.1 - 1.0 K/uL   Eosinophils Relative 1 %   Eosinophils Absolute 0.1 0.0 - 0.5 K/uL   Basophils Relative 0 %   Basophils Absolute 0.0 0.0 - 0.1 K/uL   Immature Granulocytes 1 %   Abs Immature Granulocytes 0.08 (H) 0.00 - 0.07 K/uL    Comment: Performed at [facility] [address] [place], [place] [address]  Protime-INR     Status: Abnormal   Collection Time: 02/22/19  2:00 AM  Result Value Ref Range   Prothrombin Time 27.2 (H) 11.4 - 15.2 seconds   INR 2.6 (H) 0.8 - 1.2    Comment: (NOTE) INR goal varies based on device and disease states. Performed at [facility], [place] [address], [place], [place] [address]   Type and screen [place]     Status: None (Preliminary result)   Collection Time: 02/22/19  2:00 AM  Result Value Ref Range   ABO/RH(D) A POS    Antibody Screen POS    Sample Expiration 02/25/2019,2359    Antibody Identification ANTI E    PT AG Type      NEGATIVE FOR E ANTIGEN Performed at [facility], [place] [address] [place], [place] [address]    Unit Number [id number]    Blood Component Type RED CELLS,LR    Unit division 00    Status of Unit ALLOCATED    Donor AG Type NEGATIVE FOR E ANTIGEN    Transfusion Status OK TO TRANSFUSE    Crossmatch Result COMPATIBLE   SARS Coronavirus 2 [facility] order, Performed in [facility] hospital lab) Nasopharyngeal Nasopharyngeal Swab     Status: None   Collection Time: 02/22/19  2:26 AM   Specimen: Nasopharyngeal Swab   Result Value Ref Range   SARS Coronavirus 2 NEGATIVE NEGATIVE    Comment: (NOTE) If result is NEGATIVE SARS-CoV-2 target nucleic acids are NOT DETECTED. The SARS-CoV-2 RNA is generally detectable in upper and lower  respiratory specimens during the acute phase of infection.  The lowest  concentration of SARS-CoV-2 viral copies this assay can detect is 250  copies / mL. A negative result does not preclude SARS-CoV-2 infection  and should not be used as the sole basis for treatment or other  patient management decisions.  A negative result may occur with  improper specimen collection / handling, submission of specimen other  than nasopharyngeal swab, presence of viral mutation(s) within the  areas targeted by this assay, and inadequate number of viral copies  (<250 copies / mL). A negative result must be combined with clinical  observations, patient history, and epidemiological information. If result is POSITIVE SARS-CoV-2 target nucleic acids are DETECTED. The SARS-CoV-2 RNA is generally detectable in upper and lower  respiratory specimens dur ing the acute phase of infection.  Positive  results are indicative of active infection with SARS-CoV-2.  Clinical  correlation with patient history and other diagnostic information is  necessary to determine patient infection status.  Positive results do  not rule out bacterial infection or co-infection with other viruses. If result is PRESUMPTIVE POSTIVE SARS-CoV-2 nucleic acids MAY BE PRESENT.   A presumptive positive result was obtained on the submitted specimen  and confirmed on repeat testing.  While 2019 novel coronavirus  (SARS-CoV-2) nucleic acids may be present in the submitted sample  additional confirmatory testing may be necessary for epidemiological  and / or clinical management purposes  to differentiate between  SARS-CoV-2 and other Sarbecovirus currently known to infect humans.  If clinically indicated additional testing with  an alternate test  methodology [phone]) is advised. The SARS-CoV-2 RNA is generally  detectable in upper and lower respiratory sp ecimens during the acute  phase of infection. The expected result is Negative. Fact Sheet for Patients:  StrictlyIdeas.no Fact Sheet for Healthcare Providers: BankingDealers.co.za This test is not yet approved or cleared by the [place] FDA and has been authorized for detection and/or diagnosis of SARS-CoV-2 by FDA under an Emergency Use Authorization (EUA).  This EUA will remain in effect (meaning this test can be used) for the duration of the COVID-19 declaration under Section 564(b)(1) of the Act, 21 U.S.C. section 360bbb-3(b)(1), unless the authorization is terminated or revoked sooner. Performed at [facility], [place] [address], [place], [place] [address]    Dg Chest 1 View  Result Date: 02/22/2019 CLINICAL DATA:  Recent fall with chest pain, initial encounter EXAM: CHEST  1 VIEW COMPARISON:  01/10/2012 FINDINGS: Cardiac shadow is mildly enlarged but stable. Aortic calcifications are again seen and stable. The lungs are well aerated bilaterally. Mild right basilar atelectasis is seen. Postsurgical changes are noted. IMPRESSION: Mild right basilar atelectasis. Electronically Signed   By: Inez Catalina M.D.   On: 02/22/2019 02:05   Ct Head Wo Contrast  Result Date: 02/22/2019 CLINICAL DATA:  Recent fall with headaches and neck pain, initial encounter EXAM: CT HEAD WITHOUT CONTRAST CT CERVICAL SPINE WITHOUT CONTRAST TECHNIQUE: Multidetector CT imaging of the head and cervical spine was performed following the standard protocol without intravenous contrast. Multiplanar CT image reconstructions of the cervical spine were also generated. COMPARISON:  05/24/2018 FINDINGS: CT HEAD FINDINGS Brain: Chronic atrophic changes and white matter ischemic changes are seen. Prior lacunar infarct in the medial aspect of the  left temporal lobe is seen. Left thalamic lacunar infarct is noted as well. Changes are stable from the prior exam. No acute hemorrhage or acute infarction is seen. Vascular: No hyperdense vessel or unexpected calcification. Skull: Normal. Negative for fracture or focal  lesion. Sinuses/Orbits: No acute finding. Other: None. CT CERVICAL SPINE FINDINGS Alignment: Within normal limits. Skull base and vertebrae: 7 cervical segments are well visualized. Vertebral body height is well maintained. Facet hypertrophic changes and osteophytic changes are seen. Disc space narrowing at C5-6 C6-7 is noted. No acute fracture or acute facet abnormality is noted Soft tissues and spinal canal: Surrounding soft tissue structures are within normal limits. Upper chest: Visualized lung apices are within normal limits. Other: None IMPRESSION: CT of the head: Chronic atrophic and ischemic changes. No acute abnormality noted. CT of the cervical spine: Multilevel degenerative change without acute abnormality. Electronically Signed   By: Inez Catalina M.D.   On: 02/22/2019 01:57   Ct Cervical Spine Wo Contrast  Result Date: 02/22/2019 CLINICAL DATA:  Recent fall with headaches and neck pain, initial encounter EXAM: CT HEAD WITHOUT CONTRAST CT CERVICAL SPINE WITHOUT CONTRAST TECHNIQUE: Multidetector CT imaging of the head and cervical spine was performed following the standard protocol without intravenous contrast. Multiplanar CT image reconstructions of the cervical spine were also generated. COMPARISON:  05/24/2018 FINDINGS: CT HEAD FINDINGS Brain: Chronic atrophic changes and white matter ischemic changes are seen. Prior lacunar infarct in the medial aspect of the left temporal lobe is seen. Left thalamic lacunar infarct is noted as well. Changes are stable from the prior exam. No acute hemorrhage or acute infarction is seen. Vascular: No hyperdense vessel or unexpected calcification. Skull: Normal. Negative for fracture or focal lesion.  Sinuses/Orbits: No acute finding. Other: None. CT CERVICAL SPINE FINDINGS Alignment: Within normal limits. Skull base and vertebrae: 7 cervical segments are well visualized. Vertebral body height is well maintained. Facet hypertrophic changes and osteophytic changes are seen. Disc space narrowing at C5-6 C6-7 is noted. No acute fracture or acute facet abnormality is noted Soft tissues and spinal canal: Surrounding soft tissue structures are within normal limits. Upper chest: Visualized lung apices are within normal limits. Other: None IMPRESSION: CT of the head: Chronic atrophic and ischemic changes. No acute abnormality noted. CT of the cervical spine: Multilevel degenerative change without acute abnormality. Electronically Signed   By: Inez Catalina M.D.   On: 02/22/2019 01:57   Dg Hip Unilat With Pelvis 2-3 Views Left  Result Date: 02/22/2019 CLINICAL DATA:  Recent fall with left hip pain, initial encounter EXAM: DG HIP (WITH OR WITHOUT PELVIS) 2-3V LEFT COMPARISON:  None. FINDINGS: Pelvic ring is intact. There is a subcapital left femoral neck fracture with mild impaction at the fracture site. No other focal abnormality is noted. IMPRESSION: Subcapital left femoral neck fracture. Electronically Signed   By: Inez Catalina M.D.   On: 02/22/2019 02:06    Pending Labs [employer] (From admission, onward)    Start     Ordered   Signed and Held  Protime-INR  Once,   R     Signed and Held   Signed and Held  CBC  Tomorrow morning,   R     Signed and Held   Signed and Held  Basic metabolic panel  Tomorrow morning,   R     Signed and Held          Vitals/Pain Today's Vitals   02/22/19 0545 02/22/19 0600 02/22/19 0615 02/22/19 0630  BP: (!) 139/57 (!) 161/65 (!) 143/60 (!) 143/61  Pulse: 79 77 77 74  Resp: (!) 21 (!) 22 (!) 21 19  Temp:      TempSrc:      SpO2: 99% 99% 99% 97%  Weight:      Height:      PainSc:        Isolation Precautions No active  isolations  Medications Medications  arformoterol (BROVANA) nebulizer solution 15 mcg (has no administration in time range)  umeclidinium bromide (INCRUSE ELLIPTA) 62.5 MCG/INH 1 puff (has no administration in time range)  ondansetron (ZOFRAN) injection 4 mg (4 mg Intravenous Given 02/22/19 0224)  morphine 4 MG/ML injection 4 mg (4 mg Intravenous Given 02/22/19 0256)  morphine 4 MG/ML injection 4 mg (4 mg Intravenous Given 02/22/19 0625)    Mobility walks with device High fall risk   Focused Assessments Musculoskeletal: Pain on then left hip that radiates down the left leg.     R Recommendations: See Admitting Provider Note  Report given to:   Additional Notes:

## 2019-02-22 NOTE — Progress Notes (Signed)
Initial Nutrition Assessment  DOCUMENTATION CODES:   Not applicable  INTERVENTION:  -Ensure Enlive po BID, each supplement provides 350 kcal and 20 grams of protein -MVI   NUTRITION DIAGNOSIS:   Increased nutrient needs related to wound healing(left hip fracture; surgery scheduled 8/9) as evidenced by estimated needs.   GOAL:   Patient will meet greater than or equal to 90% of their needs   MONITOR:   PO intake, Labs, Supplement acceptance, Weight trends, I & O's  REASON FOR ASSESSMENT:   Consult Hip fracture protocol  ASSESSMENT:  RD working remotely.  85 year old female with history of mechanical mitral valve, HTN, hypothyroidism who presented after a fall while walking at home. X-ray revealed left hip fracture. Orthopedic is planning to go to surgery once INR decreases by itself by holding coumadin  Unable to reach patient via phone today.   Percutaneous fixation with cannulated screws planned for tomorrow per MD note this morning. Patient will be NPO after midnight.  No documented meals at this time  RD to order Ensure to assist with calorie and protein needs and monitor for po intake at meal times  Medications reviewed and include: calcium carbonate, gabapentin, synthroid, cozaar  Labs: reviewed  Current wt 78 kg (171.6 lb) non-pitting LLE Per wt history patient has lost 7.9 lbs over the past 7 months (4.4%) insignificant for time frame  NUTRITION - FOCUSED PHYSICAL EXAM: Unable to complete at this time   Diet Order:   Diet Order            Diet NPO time specified  Diet effective midnight        Diet Heart Room service appropriate? Yes; Fluid consistency: Thin  Diet effective now              EDUCATION NEEDS:   No education needs have been identified at this time  Skin:  Skin Assessment: Reviewed RN Assessment  Last BM:  PTA  Height:   Ht Readings from Last 1 Encounters:  02/22/19 5\' 6"  (1.676 m)    Weight:   Wt Readings from Last  1 Encounters:  02/22/19 78 kg    Ideal Body Weight:  59.1 kg  BMI:  Body mass index is 27.76 kg/m.  Estimated Nutritional Needs:   Kcal:  1650-1850  Protein:  93-109  Fluid:  >1.6L   Lajuan Lines, RD, LDN [facility]rape Telephone [phone] After Hours/Weekend Pager: [phone]

## 2019-02-22 NOTE — ED Triage Notes (Signed)
TC to daughter with update and room number.

## 2019-02-22 NOTE — ED Notes (Signed)
Breakfast ordered 

## 2019-02-22 NOTE — ED Triage Notes (Signed)
Pt. Came in EMS c/o falling on the deck about 2hrs ago. Pain to the left leg and hip. 12mcg Fentanyl given in route. 20G IV hand. VS: 172/78, HR:92, RR12, 97.3 Temp. Pt is on coumadin.

## 2019-02-22 NOTE — Progress Notes (Signed)
Spoke with pharmacy regarding pts meds not showing in pyxis. Pharmacist stated meds needed to be verified. Waiting to give meds until meds are verified.

## 2019-02-22 NOTE — H&P (View-Only) (Signed)
[facility] (OTS) Consult   Patient ID: Tina Wade MRN: [id number] DOB/AGE: 09/09/1928 85 y.o.  Reason for Consult:Left femoral neck fracture Referring Physician: Dr. Delora Fuel, MD [facility]  HPI: Tina Wade is an 85 y.o. female who is being seen in consultation at the request of Dr. Roxanne Mins for evaluation of left femoral neck fracture.  The patient has a history of a mechanical mitral valve hypertension atrial fibrillation on Coumadin who slipped and fell landed on her left side.  Had immediate pain and inability to bear weight.  X-ray showed a minimally displaced left femoral neck fracture.  She was admitted to the hospitalist for perioperative management and reversal of her INR.  Patient currently denies any other symptoms or any other pain.  She has left groin pain when she moves or tries to move her hip.  Numbness or tingling.  Lives at home with her daughter.  INR was 2.6 earlier this morning.  Past Medical History:  Diagnosis Date  . Arrhythmia    chronic atrial fib. with controlled rate ,on warfarin therapy  . High cholesterol   . Hypertension   . Lower extremity edema    mild -stable  . Mitral valve stenosis 10/1994   s/p [employer] mechanical valve placement -70M-101 MODEL  . Stroke ([place]) 08/2010   from office visit 2013- the setting of normal INR of 2.8 ;therefore increased goal to 3.5  . Thyroid disease    hypothyroidism on synthroid    Past Surgical History:  Procedure Laterality Date  . CAROTID DOPPLER  04/25/2012   right ICA SMALL AMT FIBROUS PLAQUE;LEFT BULB SMALL TO MODERATE AMT OF MIXED DENSITY PLAQUE 50-69%; LEFT ICA 0-49%  . DOPPLER ECHOCARDIOGRAPHY  09/13/2011   mild concentric LVH ,EF 45-50%,mild to moderate dilated right ventricle ,moderately dilated left atrium,prosthetic vlve noemal geadients and normal functioning bileaflet valve.The aortic valve had only trace regurg. and was mildly sclerotic and the septal wall motion with post-op  state makes the echo EF difficult to assess  . MITRAL VALVE SURGERY  10/1994  . NM MYOCAR PERF WALL MOTION  03/08/2004   done in [place]-----adenosine--normal study,no ischemia  . TRANSESOPHAGEAL ECHOCARDIOGRAM  08/2010   showed mild concentric LVH, EF 50-60% with moderate aortic regurg,calcified mitral annnulus with mechanical  prosthesis well functioning smoke inthe left atrial cavity noted;mod. to severe dilated. this when she was diagnosise with afib did not undergo cardioversion    Family History  Problem Relation Age of Onset  . Heart disease Mother   . Tuberculosis Father     Social History:  reports that she has been smoking cigarettes. She has been smoking about 0.25 packs per day. She has never used smokeless tobacco. She reports that she does not drink alcohol or use drugs.  Allergies: No Known Allergies  Medications:  No current facility-administered medications on file prior to encounter.    Current Outpatient Medications on File Prior to Encounter  Medication Sig Dispense Refill  . aspirin 81 MG tablet Take 81 mg by mouth daily.      . calcium carbonate (OS-CAL) 600 MG TABS tablet Take 600 mg by mouth 2 (two) times daily with a meal.    . furosemide (LASIX) 40 MG tablet Take 40 mg by mouth daily.     Marland Kitchen gabapentin (NEURONTIN) 100 MG capsule Take 1 capsule (100 mg total) by mouth at bedtime and may repeat dose one time if needed. 30 capsule 2  . levothyroxine (SYNTHROID, LEVOTHROID)  100 MCG tablet Take 100 mcg by mouth daily.     Marland Kitchen losartan (COZAAR) 25 MG tablet Take 1 tablet (25 mg total) by mouth daily. 90 tablet 2  . montelukast (SINGULAIR) 10 MG tablet Take 10 mg by mouth at bedtime.    . rosuvastatin (CRESTOR) 10 MG tablet Take 10 mg by mouth daily.    . Tiotropium Bromide-Olodaterol (STIOLTO RESPIMAT) 2.5-2.5 MCG/ACT AERS USE 2 INHALATIONS DAILY    . warfarin (COUMADIN) 5 MG tablet TAKE ONE TO ONE AND ONE-HALF TABLETS DAILY OR AS DIRECTED (Patient taking differently:  Take 5-7.5 mg by mouth daily. ) 110 tablet 1    ROS: Constitutional: No fever or chills Vision: No changes in vision ENT: No difficulty swallowing CV: No chest pain Pulm: No SOB or wheezing GI: No nausea or vomiting GU: No urgency or inability to hold urine Skin: No poor wound healing Neurologic: No numbness or tingling Psychiatric: No depression or anxiety Heme: No bruising Allergic: No reaction to medications or food   Exam: Blood pressure (!) 153/67, pulse 82, temperature 98.1 F (36.7 C), temperature source Oral, resp. rate 16, height 5\' 6"  (1.676 m), weight 78 kg, SpO2 94 %. General: No acute distress Orientation: Awake alert and oriented x3 Mood and Affect: Cooperative and pleasant Gait: Is unable to ambulate secondary to pain in her hip Coordination and balance: Within normal limits Regular rate. No increased work of breathing  Left lower extremity: No bruising or skin lesions.  Pain with any attempted range of motion of the hip.  Knee without effusion or injury.  No deformity of the ankle or foot.  Patient is able to actively dorsiflex and plantarflex her foot.  Sensation intact to light touch to all nerve distributions.  No lymphadenopathy.  She has warm well-perfused foot with brisk cap refill of less than 2 seconds.  Reflexes are within normal limits.  Leg lengths are symmetric to the contralateral side.  Right lower extremity: skin without lesions. No tenderness to palpation. Full painless ROM, full strength in each muscle groups without evidence of instability.   Medical Decision Making: Imaging: X-rays and CT scan of the left hip show a nondisplaced to slightly valgus impacted femoral neck fracture.  No significant displacement.   Labs:  Results for orders placed or performed during the hospital encounter of 02/22/19 (from the past 24 hour(s))  Basic metabolic panel     Status: Abnormal   Collection Time: 02/22/19  2:00 AM  Result Value Ref Range   Sodium 137  135 - 145 mmol/L   Potassium 4.0 3.5 - 5.1 mmol/L   Chloride 101 98 - 111 mmol/L   CO2 26 22 - 32 mmol/L   Glucose, Bld 152 (H) 70 - 99 mg/dL   BUN 19 8 - 23 mg/dL   Creatinine, Ser 0.78 0.44 - 1.00 mg/dL   Calcium 9.1 8.9 - 10.3 mg/dL   GFR calc non Af Amer >60 >60 mL/min   GFR calc Af Amer >60 >60 mL/min   Anion gap 10 5 - 15  CBC WITH DIFFERENTIAL     Status: Abnormal   Collection Time: 02/22/19  2:00 AM  Result Value Ref Range   WBC 9.9 4.0 - 10.5 K/uL   RBC 4.22 3.87 - 5.11 MIL/uL   Hemoglobin 11.7 (L) 12.0 - 15.0 g/dL   HCT 40.0 36.0 - 46.0 %   MCV 94.8 80.0 - 100.0 fL   MCH 27.7 26.0 - 34.0 pg   MCHC 29.3 (  L) 30.0 - 36.0 g/dL   RDW 15.2 11.5 - 15.5 %   Platelets 192 150 - 400 K/uL   nRBC 0.0 0.0 - 0.2 %   Neutrophils Relative % 80 %   Neutro Abs 8.0 (H) 1.7 - 7.7 K/uL   Lymphocytes Relative 11 %   Lymphs Abs 1.1 0.7 - 4.0 K/uL   Monocytes Relative 7 %   Monocytes Absolute 0.7 0.1 - 1.0 K/uL   Eosinophils Relative 1 %   Eosinophils Absolute 0.1 0.0 - 0.5 K/uL   Basophils Relative 0 %   Basophils Absolute 0.0 0.0 - 0.1 K/uL   Immature Granulocytes 1 %   Abs Immature Granulocytes 0.08 (H) 0.00 - 0.07 K/uL  Protime-INR     Status: Abnormal   Collection Time: 02/22/19  2:00 AM  Result Value Ref Range   Prothrombin Time 27.2 (H) 11.4 - 15.2 seconds   INR 2.6 (H) 0.8 - 1.2  Type and screen [place]     Status: None (Preliminary result)   Collection Time: 02/22/19  2:00 AM  Result Value Ref Range   ABO/RH(D) A POS    Antibody Screen POS    Sample Expiration 02/25/2019,2359    Antibody Identification ANTI E    PT AG Type      NEGATIVE FOR E ANTIGEN Performed at [facility], [place] [address] [place], [place] [address]    Unit Number [id number]    Blood Component Type RED CELLS,LR    Unit division 00    Status of Unit ALLOCATED    Donor AG Type NEGATIVE FOR E ANTIGEN    Transfusion Status OK TO TRANSFUSE    Crossmatch Result  COMPATIBLE   SARS Coronavirus 2 [facility] order, Performed in [place] hospital lab) Nasopharyngeal Nasopharyngeal Swab     Status: None   Collection Time: 02/22/19  2:26 AM   Specimen: Nasopharyngeal Swab  Result Value Ref Range   SARS Coronavirus 2 NEGATIVE NEGATIVE    Medical history and chart was reviewed  Assessment/Plan: 85 year old female with a history of atrial fibrillation with mechanical heart valve on Coumadin with a ground-level fall and nondisplaced/minimally displaced left femoral neck fracture.  The patient has a relatively nondisplaced left femoral neck fracture.  I feel that percutaneous fixation with the cannulated screws would be most appropriate.  She does have some pre-existing arthritis however I do not feel that arthroplasty is warranted.  I also feel that due to her anticoagulation the percutaneous fixation would be less risks.  I did discuss risks and benefits with the patient and her daughter.  Risks included but not limited to bleeding, infection, malunion, nonunion, hardware failure, hardware cut out, progression of arthritis, need for total or partial hip replacement, nerve and blood vessel injury, DVT, loss of ambulatory capabilities.  The patient and her daughter agreed to proceed with surgery will plan for tomorrow morning.  N.p.o. after midnight.  Recheck INR in the morning.  However I do not feel that there is any need for delay after tomorrow.  Shona Needles, MD [facility] [phone] (phone) [phone] (office) [email or web address]

## 2019-02-22 NOTE — Progress Notes (Signed)
Contacted both ED and 5n pharmacist regarding meds needing verification. Pharmacist stated that they would have to complete med reconciliation.

## 2019-02-22 NOTE — Progress Notes (Signed)
Progress Note   Tina Wade OIN:[id number] DOB: [birth year] DOA: 02/22/2019  PCP: Bernerd Limbo, MD  Patient coming from: Home  Chief Complaint: Fall.  HPI: Tina Wade is a 85 y.o. female with history of mechanical mitral valve, hypertension and, A. fib was walking at home when she slipped and fell did not hit her head or lose consciousness.  Hurt her left side and was brought to the ER. In the ER x-ray showed left hip fracture on-call orthopedic surgeon Dr. Doreatha Martin was consulted.  CT head C-spine were negative.  COVID-19 test was negative.  EKG shows A. fib rest of the labs show hemoglobin of 11.7 platelets 192 creatinine 1.7 INR 2.   Subjective: No acute issues/events since admission. Pain well controlled, tolerating PO. Denies shortness of breath, chest pain, headache, fevers, chills.  Assessment/Plan Principal Problem:   Closed left hip fracture, initial encounter [facility]) Active Problems:   H/O mitral valve replacement   Chronic atrial fibrillation   Essential hypertension  Closed left hip fracture status post mechanical fall  -appears to be at moderate risk for intermediate risk procedure. -Possible percutaneous fixation with cannulated screws 02/23/19; NPO at midnight; follow INR although less likely to delay surgery given ortho note  Mechanical mitral valve on Coumadin  -on hold secondary to possible surgery -follow INR - transition to heparin gtt perioperatively if INR <2.5  Chronic atrial fibrillation rate controlled.   -Coumadin on hold for surgery -Patient not on any rate control per home med list - continue to follow  History of TIA  -Continue on statins -coumadin is on hold - also on 81mg  asa at home  COPD, not in acute exacerbation -not actively wheezing on inhalers.  Hypertension, essential, well controlled -Continue on Cozaar.  Hypothyroidism  -Continue Synthroid.   DVT prophylaxis: SCDs in anticipation of surgery. Code Status: DNR confirmed with  patient. Disposition Plan: Pending clinical status - may benefit from short rehab stay - defer to PT/OT  Consults called: Orthopedics. Admission status: Inpatient.   Prior to Admission medications   Medication Sig Start Date End Date Taking? Authorizing Provider  aspirin 81 MG tablet Take 81 mg by mouth daily.      [provider]  calcium carbonate (OS-CAL) 600 MG TABS tablet Take 600 mg by mouth 2 (two) times daily with a meal.    [provider]  furosemide (LASIX) 40 MG tablet Take 40 mg by mouth daily.     [provider]  gabapentin (NEURONTIN) 100 MG capsule Take 1 capsule (100 mg total) by mouth at bedtime and may repeat dose one time if needed. 11/04/18 11/04/19  Garvin Fila, MD  levothyroxine (SYNTHROID, LEVOTHROID) 100 MCG tablet Take 100 mcg by mouth daily.     [provider]  losartan (COZAAR) 25 MG tablet Take 1 tablet (25 mg total) by mouth daily. 03/27/14   Leonie Man, MD  montelukast (SINGULAIR) 10 MG tablet Take 10 mg by mouth at bedtime.    [provider]  rosuvastatin (CRESTOR) 10 MG tablet Take 10 mg by mouth daily. 02/14/18   [provider]  Tiotropium Bromide-Olodaterol (STIOLTO RESPIMAT) 2.5-2.5 MCG/ACT AERS USE 2 INHALATIONS DAILY 06/17/18   [provider]  warfarin (COUMADIN) 5 MG tablet TAKE ONE TO ONE AND ONE-HALF TABLETS DAILY OR AS DIRECTED Patient taking differently: Take 5-7.5 mg by mouth daily.     Rockne Menghini, RPH-CPP    Physical Exam: Constitutional: Moderately built and nourished. Vitals:  02/22/19 0630 02/22/19 0645 02/22/19 0700 02/22/19 0833  BP: (!) 143/61 (!) 155/67 (!) 160/65 (!) 153/67  Pulse: 74 76 79 82  Resp: 19 19 20 16   Temp:    98.1 F (36.7 C)  TempSrc:    Oral  SpO2: 97% 98% 100% 94%  Weight:      Height:       General:  Pleasantly resting in bed, No acute distress. HEENT:  Normocephalic atraumatic.  Sclerae nonicteric, noninjected.  Extraocular  movements intact bilaterally. Neck:  Without mass or deformity.  Trachea is midline. Lungs:  Clear to auscultate bilaterally without rhonchi, wheeze, or rales. Heart:  Regular rate and rhythm.  Without murmurs, rubs, or gallops. Abdomen:  Soft, nontender, nondistended.  Without guarding or rebound. Extremities: Without cyanosis, clubbing, edema, or obvious deformity. Vascular:  Dorsalis pedis and posterior tibial pulses palpable bilaterally. Skin:  Warm and dry, no erythema, no ulcerations.    Labs on Admission: I have personally reviewed following labs and imaging studies  CBC: Recent Labs  Lab 02/22/19 0200  WBC 9.9  NEUTROABS 8.0*  HGB 11.7*  HCT 40.0  MCV 94.8  PLT 696   Basic Metabolic Panel: Recent Labs  Lab 02/22/19 0200  NA 137  K 4.0  CL 101  CO2 26  GLUCOSE 152*  BUN 19  CREATININE 0.78  CALCIUM 9.1   GFR: Estimated Creatinine Clearance: 52.2 mL/min (by C-G formula based on SCr of 0.78 mg/dL). Liver Function Tests: No results for input(s): AST, ALT, ALKPHOS, BILITOT, PROT, ALBUMIN in the last 168 hours. No results for input(s): LIPASE, AMYLASE in the last 168 hours. No results for input(s): AMMONIA in the last 168 hours. Coagulation Profile: Recent Labs  Lab 02/22/19 0200  INR 2.6*   Cardiac Enzymes: No results for input(s): CKTOTAL, CKMB, CKMBINDEX, TROPONINI in the last 168 hours. BNP (last 3 results) No results for input(s): PROBNP in the last 8760 hours. HbA1C: No results for input(s): HGBA1C in the last 72 hours. CBG: No results for input(s): GLUCAP in the last 168 hours. Lipid Profile: No results for input(s): CHOL, HDL, LDLCALC, TRIG, CHOLHDL, LDLDIRECT in the last 72 hours. Thyroid Function Tests: No results for input(s): TSH, T4TOTAL, FREET4, T3FREE, THYROIDAB in the last 72 hours. Anemia Panel: No results for input(s): VITAMINB12, FOLATE, FERRITIN, TIBC, IRON, RETICCTPCT in the last 72 hours. Urine analysis:    Component Value  Date/Time   COLORURINE YELLOW 09/06/2010 0057   APPEARANCEUR CLOUDY (A) 09/06/2010 0057   LABSPEC 1.020 09/06/2010 0057   PHURINE 7.0 09/06/2010 0057   GLUCOSEU NEGATIVE 11/06/2009 1300   HGBUR NEGATIVE 09/06/2010 0057   BILIRUBINUR NEGATIVE 09/06/2010 0057   KETONESUR NEGATIVE 09/06/2010 0057   PROTEINUR NEGATIVE 09/06/2010 0057   UROBILINOGEN 0.2 09/06/2010 0057   NITRITE NEGATIVE 09/06/2010 0057   LEUKOCYTESUR NEGATIVE 09/06/2010 0057    Recent Results (from the past 240 hour(s))  SARS Coronavirus 2 [facility] order, Performed in [facility] hospital lab) Nasopharyngeal Nasopharyngeal Swab     Status: None   Collection Time: 02/22/19  2:26 AM   Specimen: Nasopharyngeal Swab  Result Value Ref Range Status   SARS Coronavirus 2 NEGATIVE NEGATIVE Final    Comment: (NOTE) If result is NEGATIVE SARS-CoV-2 target nucleic acids are NOT DETECTED. The SARS-CoV-2 RNA is generally detectable in upper and lower  respiratory specimens during the acute phase of infection. The lowest  concentration of SARS-CoV-2 viral copies this assay can detect is 250  copies / mL. A negative  result does not preclude SARS-CoV-2 infection  and should not be used as the sole basis for treatment or other  patient management decisions.  A negative result may occur with  improper specimen collection / handling, submission of specimen other  than nasopharyngeal swab, presence of viral mutation(s) within the  areas targeted by this assay, and inadequate number of viral copies  (<250 copies / mL). A negative result must be combined with clinical  observations, patient history, and epidemiological information. If result is POSITIVE SARS-CoV-2 target nucleic acids are DETECTED. The SARS-CoV-2 RNA is generally detectable in upper and lower  respiratory specimens dur ing the acute phase of infection.  Positive  results are indicative of active infection with SARS-CoV-2.  Clinical  correlation with patient history  and other diagnostic information is  necessary to determine patient infection status.  Positive results do  not rule out bacterial infection or co-infection with other viruses. If result is PRESUMPTIVE POSTIVE SARS-CoV-2 nucleic acids MAY BE PRESENT.   A presumptive positive result was obtained on the submitted specimen  and confirmed on repeat testing.  While 2019 novel coronavirus  (SARS-CoV-2) nucleic acids may be present in the submitted sample  additional confirmatory testing may be necessary for epidemiological  and / or clinical management purposes  to differentiate between  SARS-CoV-2 and other Sarbecovirus currently known to infect humans.  If clinically indicated additional testing with an alternate test  methodology [id number]) is advised. The SARS-CoV-2 RNA is generally  detectable in upper and lower respiratory sp ecimens during the acute  phase of infection. The expected result is Negative. Fact Sheet for Patients:  StrictlyIdeas.no Fact Sheet for Healthcare Providers: BankingDealers.co.za This test is not yet approved or cleared by the [place] FDA and has been authorized for detection and/or diagnosis of SARS-CoV-2 by FDA under an Emergency Use Authorization (EUA).  This EUA will remain in effect (meaning this test can be used) for the duration of the COVID-19 declaration under Section 564(b)(1) of the Act, 21 U.S.C. section 360bbb-3(b)(1), unless the authorization is terminated or revoked sooner. Performed at [facility], [place] [address] [place], [place] [address]      Radiological Exams on Admission: Dg Chest 1 View  Result Date: 02/22/2019 CLINICAL DATA:  Recent fall with chest pain, initial encounter EXAM: CHEST  1 VIEW COMPARISON:  01/10/2012 FINDINGS: Cardiac shadow is mildly enlarged but stable. Aortic calcifications are again seen and stable. The lungs are well aerated bilaterally. Mild right basilar  atelectasis is seen. Postsurgical changes are noted. IMPRESSION: Mild right basilar atelectasis. Electronically Signed   By: Inez Catalina M.D.   On: 02/22/2019 02:05   Ct Head Wo Contrast  Result Date: 02/22/2019 CLINICAL DATA:  Recent fall with headaches and neck pain, initial encounter EXAM: CT HEAD WITHOUT CONTRAST CT CERVICAL SPINE WITHOUT CONTRAST TECHNIQUE: Multidetector CT imaging of the head and cervical spine was performed following the standard protocol without intravenous contrast. Multiplanar CT image reconstructions of the cervical spine were also generated. COMPARISON:  05/24/2018 FINDINGS: CT HEAD FINDINGS Brain: Chronic atrophic changes and white matter ischemic changes are seen. Prior lacunar infarct in the medial aspect of the left temporal lobe is seen. Left thalamic lacunar infarct is noted as well. Changes are stable from the prior exam. No acute hemorrhage or acute infarction is seen. Vascular: No hyperdense vessel or unexpected calcification. Skull: Normal. Negative for fracture or focal lesion. Sinuses/Orbits: No acute finding. Other: None. CT CERVICAL SPINE FINDINGS Alignment: Within normal  limits. Skull base and vertebrae: 7 cervical segments are well visualized. Vertebral body height is well maintained. Facet hypertrophic changes and osteophytic changes are seen. Disc space narrowing at C5-6 C6-7 is noted. No acute fracture or acute facet abnormality is noted Soft tissues and spinal canal: Surrounding soft tissue structures are within normal limits. Upper chest: Visualized lung apices are within normal limits. Other: None IMPRESSION: CT of the head: Chronic atrophic and ischemic changes. No acute abnormality noted. CT of the cervical spine: Multilevel degenerative change without acute abnormality. Electronically Signed   By: Inez Catalina M.D.   On: 02/22/2019 01:57   Ct Cervical Spine Wo Contrast  Result Date: 02/22/2019 CLINICAL DATA:  Recent fall with headaches and neck pain,  initial encounter EXAM: CT HEAD WITHOUT CONTRAST CT CERVICAL SPINE WITHOUT CONTRAST TECHNIQUE: Multidetector CT imaging of the head and cervical spine was performed following the standard protocol without intravenous contrast. Multiplanar CT image reconstructions of the cervical spine were also generated. COMPARISON:  05/24/2018 FINDINGS: CT HEAD FINDINGS Brain: Chronic atrophic changes and white matter ischemic changes are seen. Prior lacunar infarct in the medial aspect of the left temporal lobe is seen. Left thalamic lacunar infarct is noted as well. Changes are stable from the prior exam. No acute hemorrhage or acute infarction is seen. Vascular: No hyperdense vessel or unexpected calcification. Skull: Normal. Negative for fracture or focal lesion. Sinuses/Orbits: No acute finding. Other: None. CT CERVICAL SPINE FINDINGS Alignment: Within normal limits. Skull base and vertebrae: 7 cervical segments are well visualized. Vertebral body height is well maintained. Facet hypertrophic changes and osteophytic changes are seen. Disc space narrowing at C5-6 C6-7 is noted. No acute fracture or acute facet abnormality is noted Soft tissues and spinal canal: Surrounding soft tissue structures are within normal limits. Upper chest: Visualized lung apices are within normal limits. Other: None IMPRESSION: CT of the head: Chronic atrophic and ischemic changes. No acute abnormality noted. CT of the cervical spine: Multilevel degenerative change without acute abnormality. Electronically Signed   By: Inez Catalina M.D.   On: 02/22/2019 01:57   Ct Hip Left Wo Contrast  Result Date: 02/22/2019 CLINICAL DATA:  85 year old female with history of left hip pain. Evaluate for left hip fracture. EXAM: CT OF THE LEFT HIP WITHOUT CONTRAST TECHNIQUE: Multidetector CT imaging of the left hip was performed according to the standard protocol. Multiplanar CT image reconstructions were also generated. COMPARISON:  No priors.  Left hip  radiograph 02/22/2019. FINDINGS: Bones/Joint/Cartilage Nondisplaced subcapital fracture again noted. No other acute displaced fracture identified in the visualized portions of the axial and appendicular skeleton. Femoral head remains located. Joint space narrowing, subchondral sclerosis, subchondral cyst formation and osteophyte formation in the hip joint, compatible with moderate osteoarthritis. Ligaments Suboptimally assessed by CT. Muscles and Tendons Unremarkable. Soft tissues Unremarkable. IMPRESSION: 1. Nondisplaced subcapital left hip fracture redemonstrated, as above. 2. Moderate left hip joint osteoarthritis. Electronically Signed   By: Vinnie Langton M.D.   On: 02/22/2019 08:22   Dg Hip Unilat With Pelvis 2-3 Views Left  Result Date: 02/22/2019 CLINICAL DATA:  Recent fall with left hip pain, initial encounter EXAM: DG HIP (WITH OR WITHOUT PELVIS) 2-3V LEFT COMPARISON:  None. FINDINGS: Pelvic ring is intact. There is a subcapital left femoral neck fracture with mild impaction at the fracture site. No other focal abnormality is noted. IMPRESSION: Subcapital left femoral neck fracture. Electronically Signed   By: Inez Catalina M.D.   On: 02/22/2019 02:06    EKG: Independently  reviewed.  A. fib rate controlled.  Little Ishikawa DO [facility] Pager [phone].  If 7PM-7AM, please contact night-coverage [email or web address] Password [email or web address]  02/22/2019, 11:01 AM

## 2019-02-22 NOTE — ED Notes (Signed)
Pt not in room at this moment. Will return for blood draw

## 2019-02-22 NOTE — H&P (Signed)
History and Physical    Tina Wade BHA:[id number] DOB: January 24, 1929 DOA: 02/22/2019  PCP: Bernerd Limbo, MD  Patient coming from: Home  Chief Complaint: Fall.  HPI: Tina Wade is a 85 y.o. female with history of mechanical mitral valve, hypertension and, A. fib was walking at home when she slipped and fell did not hit her head or lose consciousness.  Hurt her left side and was brought to the ER.  ED Course: In the ER x-ray showed left hip fracture on-call orthopedic surgeon Dr. Doreatha Martin was consulted.  CT head C-spine were negative.  COVID-19 test was negative.  EKG shows A. fib rest of the labs show hemoglobin of 11.7 platelets 192 creatinine 1.7 INR 2.  At this time orthopedic is planning to go to surgery once INR decreases by itself by holding Coumadin.  Review of Systems: As per HPI, rest all negative.   Past Medical History:  Diagnosis Date   Arrhythmia    chronic atrial fib. with controlled rate ,on warfarin therapy   High cholesterol    Hypertension    Lower extremity edema    mild -stable   Mitral valve stenosis 10/1994   s/p [employer] Jude mechanical valve placement -53M-101 MODEL   Stroke ([place]) 08/2010   from office visit 2013- the setting of normal INR of 2.8 ;therefore increased goal to 3.5   Thyroid disease    hypothyroidism on synthroid    Past Surgical History:  Procedure Laterality Date   CAROTID DOPPLER  04/25/2012   right ICA SMALL AMT FIBROUS PLAQUE;LEFT BULB SMALL TO MODERATE AMT OF MIXED DENSITY PLAQUE 50-69%; LEFT ICA 0-49%   DOPPLER ECHOCARDIOGRAPHY  09/13/2011   mild concentric LVH ,EF 45-50%,mild to moderate dilated right ventricle ,moderately dilated left atrium,prosthetic vlve noemal geadients and normal functioning bileaflet valve.The aortic valve had only trace regurg. and was mildly sclerotic and the septal wall motion with post-op state makes the echo EF difficult to assess   MITRAL VALVE SURGERY  10/1994   [facility] [place]   03/08/2004   done in [place]-----adenosine--normal study,no ischemia   TRANSESOPHAGEAL ECHOCARDIOGRAM  08/2010   showed mild concentric LVH, EF 50-60% with moderate aortic regurg,calcified mitral annnulus with mechanical  prosthesis well functioning smoke inthe left atrial cavity noted;mod. to severe dilated. this when she was diagnosise with afib did not undergo cardioversion     reports that she has been smoking cigarettes. She has been smoking about 0.25 packs per day. She has never used smokeless tobacco. She reports that she does not drink alcohol or use drugs.  No Known Allergies  Family History  Problem Relation Age of Onset   Heart disease Mother    Tuberculosis Father     Prior to Admission medications   Medication Sig Start Date End Date Taking? Authorizing Provider  aspirin 81 MG tablet Take 81 mg by mouth daily.      [provider]  calcium carbonate (OS-CAL) 600 MG TABS tablet Take 600 mg by mouth 2 (two) times daily with a meal.    [provider]  furosemide (LASIX) 40 MG tablet Take 40 mg by mouth daily.     [provider]  gabapentin (NEURONTIN) 100 MG capsule Take 1 capsule (100 mg total) by mouth at bedtime and may repeat dose one time if needed. 11/04/18 11/04/19  Garvin Fila, MD  levothyroxine (SYNTHROID, LEVOTHROID) 100 MCG tablet Take 100 mcg by mouth daily.     [provider]  losartan (COZAAR) 25 MG tablet Take 1 tablet (25 mg total) by mouth daily. 03/27/14   Leonie Man, MD  montelukast (SINGULAIR) 10 MG tablet Take 10 mg by mouth at bedtime.    [provider]  rosuvastatin (CRESTOR) 10 MG tablet Take 10 mg by mouth daily. 02/14/18   [provider]  Tiotropium Bromide-Olodaterol (STIOLTO RESPIMAT) 2.5-2.5 MCG/ACT AERS USE 2 INHALATIONS DAILY 06/17/18   [provider]  warfarin (COUMADIN) 5 MG tablet TAKE ONE TO ONE AND ONE-HALF TABLETS DAILY OR AS DIRECTED Patient taking differently: Take  5-7.5 mg by mouth daily.     Rockne Menghini, RPH-CPP    Physical Exam: Constitutional: Moderately built and nourished. Vitals:   02/22/19 0300 02/22/19 0315 02/22/19 0330 02/22/19 0345  BP:      Pulse: 79 73 75 73  Resp: 20 18 18 16   Temp:      TempSrc:      SpO2: 97% 97% 96% 97%  Weight:      Height:       Eyes: Anicteric no pallor. ENMT: No discharge from the ears eyes nose or mouth. Neck: No mass or.  No neck rigidity. Respiratory: No rhonchi or crepitations. Cardiovascular: S1-S2 heard. Abdomen: Soft nontender bowel sounds present. Musculoskeletal: Pain on moving left hip. Skin: No rash. Neurologic: Alert awake oriented to time place and person.  Moves all extremities. Psychiatric: Appears normal.   Labs on Admission: I have personally reviewed following labs and imaging studies  CBC: Recent Labs  Lab 02/22/19 0200  WBC 9.9  NEUTROABS 8.0*  HGB 11.7*  HCT 40.0  MCV 94.8  PLT 425   Basic Metabolic Panel: Recent Labs  Lab 02/22/19 0200  NA 137  K 4.0  CL 101  CO2 26  GLUCOSE 152*  BUN 19  CREATININE 0.78  CALCIUM 9.1   GFR: Estimated Creatinine Clearance: 52.2 mL/min (by C-G formula based on SCr of 0.78 mg/dL). Liver Function Tests: No results for input(s): AST, ALT, ALKPHOS, BILITOT, PROT, ALBUMIN in the last 168 hours. No results for input(s): LIPASE, AMYLASE in the last 168 hours. No results for input(s): AMMONIA in the last 168 hours. Coagulation Profile: Recent Labs  Lab 02/22/19 0200  INR 2.6*   Cardiac Enzymes: No results for input(s): CKTOTAL, CKMB, CKMBINDEX, TROPONINI in the last 168 hours. BNP (last 3 results) No results for input(s): PROBNP in the last 8760 hours. HbA1C: No results for input(s): HGBA1C in the last 72 hours. CBG: No results for input(s): GLUCAP in the last 168 hours. Lipid Profile: No results for input(s): CHOL, HDL, LDLCALC, TRIG, CHOLHDL, LDLDIRECT in the last 72 hours. Thyroid Function Tests: No results  for input(s): TSH, T4TOTAL, FREET4, T3FREE, THYROIDAB in the last 72 hours. Anemia Panel: No results for input(s): VITAMINB12, FOLATE, FERRITIN, TIBC, IRON, RETICCTPCT in the last 72 hours. Urine analysis:    Component Value Date/Time   COLORURINE YELLOW 09/06/2010 0057   APPEARANCEUR CLOUDY (A) 09/06/2010 0057   LABSPEC 1.020 09/06/2010 0057   PHURINE 7.0 09/06/2010 0057   GLUCOSEU NEGATIVE 11/06/2009 1300   HGBUR NEGATIVE 09/06/2010 0057   BILIRUBINUR NEGATIVE 09/06/2010 [place] 09/06/2010 0057   PROTEINUR NEGATIVE 09/06/2010 0057   UROBILINOGEN 0.2 09/06/2010 0057   NITRITE NEGATIVE 09/06/2010 0057   LEUKOCYTESUR NEGATIVE 09/06/2010 0057   Sepsis Labs: @LABRCNTIP (procalcitonin:4,lacticidven:4) ) Recent Results (from the past 240 hour(s))  SARS Coronavirus 2 [facility] order, Performed in [facility] hospital lab) Nasopharyngeal Nasopharyngeal  Swab     Status: None   Collection Time: 02/22/19  2:26 AM   Specimen: Nasopharyngeal Swab  Result Value Ref Range Status   SARS Coronavirus 2 NEGATIVE NEGATIVE Final    Comment: (NOTE) If result is NEGATIVE SARS-CoV-2 target nucleic acids are NOT DETECTED. The SARS-CoV-2 RNA is generally detectable in upper and lower  respiratory specimens during the acute phase of infection. The lowest  concentration of SARS-CoV-2 viral copies this assay can detect is 250  copies / mL. A negative result does not preclude SARS-CoV-2 infection  and should not be used as the sole basis for treatment or other  patient management decisions.  A negative result may occur with  improper specimen collection / handling, submission of specimen other  than nasopharyngeal swab, presence of viral mutation(s) within the  areas targeted by this assay, and inadequate number of viral copies  (<250 copies / mL). A negative result must be combined with clinical  observations, patient history, and epidemiological information. If result is  POSITIVE SARS-CoV-2 target nucleic acids are DETECTED. The SARS-CoV-2 RNA is generally detectable in upper and lower  respiratory specimens dur ing the acute phase of infection.  Positive  results are indicative of active infection with SARS-CoV-2.  Clinical  correlation with patient history and other diagnostic information is  necessary to determine patient infection status.  Positive results do  not rule out bacterial infection or co-infection with other viruses. If result is PRESUMPTIVE POSTIVE SARS-CoV-2 nucleic acids MAY BE PRESENT.   A presumptive positive result was obtained on the submitted specimen  and confirmed on repeat testing.  While 2019 novel coronavirus  (SARS-CoV-2) nucleic acids may be present in the submitted sample  additional confirmatory testing may be necessary for epidemiological  and / or clinical management purposes  to differentiate between  SARS-CoV-2 and other Sarbecovirus currently known to infect humans.  If clinically indicated additional testing with an alternate test  methodology [phone]) is advised. The SARS-CoV-2 RNA is generally  detectable in upper and lower respiratory sp ecimens during the acute  phase of infection. The expected result is Negative. Fact Sheet for Patients:  StrictlyIdeas.no Fact Sheet for Healthcare Providers: BankingDealers.co.za This test is not yet approved or cleared by the [place] FDA and has been authorized for detection and/or diagnosis of SARS-CoV-2 by FDA under an Emergency Use Authorization (EUA).  This EUA will remain in effect (meaning this test can be used) for the duration of the COVID-19 declaration under Section 564(b)(1) of the Act, 21 U.S.C. section 360bbb-3(b)(1), unless the authorization is terminated or revoked sooner. Performed at [facility], [address], [place], [place] [address]      Radiological Exams on Admission: Dg Chest 1  View  Result Date: 02/22/2019 CLINICAL DATA:  Recent fall with chest pain, initial encounter EXAM: CHEST  1 VIEW COMPARISON:  01/10/2012 FINDINGS: Cardiac shadow is mildly enlarged but stable. Aortic calcifications are again seen and stable. The lungs are well aerated bilaterally. Mild right basilar atelectasis is seen. Postsurgical changes are noted. IMPRESSION: Mild right basilar atelectasis. Electronically Signed   By: Inez Catalina M.D.   On: 02/22/2019 02:05   Ct Head Wo Contrast  Result Date: 02/22/2019 CLINICAL DATA:  Recent fall with headaches and neck pain, initial encounter EXAM: CT HEAD WITHOUT CONTRAST CT CERVICAL SPINE WITHOUT CONTRAST TECHNIQUE: Multidetector CT imaging of the head and cervical spine was performed following the standard protocol without intravenous contrast. Multiplanar CT image reconstructions of  the cervical spine were also generated. COMPARISON:  05/24/2018 FINDINGS: CT HEAD FINDINGS Brain: Chronic atrophic changes and white matter ischemic changes are seen. Prior lacunar infarct in the medial aspect of the left temporal lobe is seen. Left thalamic lacunar infarct is noted as well. Changes are stable from the prior exam. No acute hemorrhage or acute infarction is seen. Vascular: No hyperdense vessel or unexpected calcification. Skull: Normal. Negative for fracture or focal lesion. Sinuses/Orbits: No acute finding. Other: None. CT CERVICAL SPINE FINDINGS Alignment: Within normal limits. Skull base and vertebrae: 7 cervical segments are well visualized. Vertebral body height is well maintained. Facet hypertrophic changes and osteophytic changes are seen. Disc space narrowing at C5-6 C6-7 is noted. No acute fracture or acute facet abnormality is noted Soft tissues and spinal canal: Surrounding soft tissue structures are within normal limits. Upper chest: Visualized lung apices are within normal limits. Other: None IMPRESSION: CT of the head: Chronic atrophic and ischemic changes. No  acute abnormality noted. CT of the cervical spine: Multilevel degenerative change without acute abnormality. Electronically Signed   By: Inez Catalina M.D.   On: 02/22/2019 01:57   Ct Cervical Spine Wo Contrast  Result Date: 02/22/2019 CLINICAL DATA:  Recent fall with headaches and neck pain, initial encounter EXAM: CT HEAD WITHOUT CONTRAST CT CERVICAL SPINE WITHOUT CONTRAST TECHNIQUE: Multidetector CT imaging of the head and cervical spine was performed following the standard protocol without intravenous contrast. Multiplanar CT image reconstructions of the cervical spine were also generated. COMPARISON:  05/24/2018 FINDINGS: CT HEAD FINDINGS Brain: Chronic atrophic changes and white matter ischemic changes are seen. Prior lacunar infarct in the medial aspect of the left temporal lobe is seen. Left thalamic lacunar infarct is noted as well. Changes are stable from the prior exam. No acute hemorrhage or acute infarction is seen. Vascular: No hyperdense vessel or unexpected calcification. Skull: Normal. Negative for fracture or focal lesion. Sinuses/Orbits: No acute finding. Other: None. CT CERVICAL SPINE FINDINGS Alignment: Within normal limits. Skull base and vertebrae: 7 cervical segments are well visualized. Vertebral body height is well maintained. Facet hypertrophic changes and osteophytic changes are seen. Disc space narrowing at C5-6 C6-7 is noted. No acute fracture or acute facet abnormality is noted Soft tissues and spinal canal: Surrounding soft tissue structures are within normal limits. Upper chest: Visualized lung apices are within normal limits. Other: None IMPRESSION: CT of the head: Chronic atrophic and ischemic changes. No acute abnormality noted. CT of the cervical spine: Multilevel degenerative change without acute abnormality. Electronically Signed   By: Inez Catalina M.D.   On: 02/22/2019 01:57   Dg Hip Unilat With Pelvis 2-3 Views Left  Result Date: 02/22/2019 CLINICAL DATA:  Recent fall  with left hip pain, initial encounter EXAM: DG HIP (WITH OR WITHOUT PELVIS) 2-3V LEFT COMPARISON:  None. FINDINGS: Pelvic ring is intact. There is a subcapital left femoral neck fracture with mild impaction at the fracture site. No other focal abnormality is noted. IMPRESSION: Subcapital left femoral neck fracture. Electronically Signed   By: Inez Catalina M.D.   On: 02/22/2019 02:06    EKG: Independently reviewed.  A. fib rate controlled.  Assessment/Plan Principal Problem:   Closed left hip fracture, initial encounter [facility]) Active Problems:   H/O mitral valve replacement   Chronic atrial fibrillation   Essential hypertension    1. Left hip fracture status post mechanical fall -appears to be at moderate risk for intermediate risk procedure.  Plan is to do surgery once INR  is decreased by holding Coumadin.  Continue pain relief medications.  Orthopedics consulted. 2. Mechanical mitral valve on Coumadin on hold secondary to possible surgery. 3. Chronic atrial fibrillation rate controlled.  Coumadin on hold for surgery.  Patient agreeable to holding. 4. History of TIA on statins and Coumadin Coumadin is on hold. 5. COPD not actively wheezing on inhalers. 6. Hypertension on Cozaar. 7. Anemia follow CBC. 8. Hypothyroidism on Synthroid.  Since patient has hip fracture and likely need for surgery and close monitoring patient will need inpatient status.   DVT prophylaxis: SCDs in anticipation of surgery. Code Status: DNR confirmed with patient. Family Communication: Discussed with patient. Disposition Plan: Likely will need rehab. Consults called: Orthopedics. Admission status: Inpatient.   Rise Patience MD [facility] Pager [phone].  If 7PM-7AM, please contact night-coverage [email or web address] Password [email or web address]  02/22/2019, 5:00 AM

## 2019-02-23 ENCOUNTER — Inpatient Hospital Stay (HOSPITAL_COMMUNITY): Payer: Medicare Other

## 2019-02-23 ENCOUNTER — Inpatient Hospital Stay (HOSPITAL_COMMUNITY): Payer: Medicare Other | Admitting: Certified Registered"

## 2019-02-23 ENCOUNTER — Encounter (HOSPITAL_COMMUNITY): Payer: Self-pay | Admitting: Certified Registered"

## 2019-02-23 ENCOUNTER — Encounter (HOSPITAL_COMMUNITY)
Admission: EM | Disposition: A | Discharge status: Home Health | Payer: Self-pay | Source: Home / Self Care | Attending: Internal Medicine

## 2019-02-23 HISTORY — PX: HIP PINNING,CANNULATED: SHX1758

## 2019-02-23 LAB — CBC
HCT: 38.9 % (ref 36.0–46.0)
Hemoglobin: 11.2 g/dL — ABNORMAL LOW (ref 12.0–15.0)
MCH: 27.9 pg (ref 26.0–34.0)
MCHC: 28.8 g/dL — ABNORMAL LOW (ref 30.0–36.0)
MCV: 97 fL (ref 80.0–100.0)
Platelets: 144 10*3/uL — ABNORMAL LOW (ref 150–400)
RBC: 4.01 MIL/uL (ref 3.87–5.11)
RDW: 15.3 % (ref 11.5–15.5)
WBC: 7.9 10*3/uL (ref 4.0–10.5)
nRBC: 0 % (ref 0.0–0.2)

## 2019-02-23 LAB — BASIC METABOLIC PANEL
Anion gap: 11 (ref 5–15)
BUN: 16 mg/dL (ref 8–23)
CO2: 27 mmol/L (ref 22–32)
Calcium: 9.3 mg/dL (ref 8.9–10.3)
Chloride: 98 mmol/L (ref 98–111)
Creatinine, Ser: 0.68 mg/dL (ref 0.44–1.00)
GFR calc Af Amer: >60 mL/min (ref >60)
GFR calc non Af Amer: >60 mL/min (ref >60)
Glucose, Bld: 109 mg/dL — ABNORMAL HIGH (ref 70–99)
Potassium: 4.8 mmol/L (ref 3.5–5.1)
Sodium: 136 mmol/L (ref 135–145)

## 2019-02-23 LAB — SURGICAL PCR SCREEN
MRSA, PCR: NEGATIVE
Staphylococcus aureus: NEGATIVE

## 2019-02-23 LAB — PROTIME-INR
INR: 2.4 — ABNORMAL HIGH (ref 0.8–1.2)
Prothrombin Time: 26 seconds — ABNORMAL HIGH (ref 11.4–15.2)

## 2019-02-23 SURGERY — FIXATION, FEMUR, NECK, PERCUTANEOUS, USING SCREW
Anesthesia: General | Site: Hip | Laterality: Left

## 2019-02-23 MED ORDER — SUGAMMADEX SODIUM 200 MG/2ML IV SOLN
INTRAVENOUS | Status: DC | PRN
  Administered 2019-02-23: 80 mg via INTRAVENOUS

## 2019-02-23 MED ORDER — MIDAZOLAM HCL 5 MG/5ML IJ SOLN
INTRAMUSCULAR | Status: DC | PRN
  Administered 2019-02-23: 1 mg via INTRAVENOUS

## 2019-02-23 MED ORDER — DEXAMETHASONE SODIUM PHOSPHATE 10 MG/ML IJ SOLN
INTRAMUSCULAR | Status: AC
  Filled 2019-02-23: qty 1

## 2019-02-23 MED ORDER — ONDANSETRON HCL 4 MG/2ML IJ SOLN
INTRAMUSCULAR | Status: DC | PRN
  Administered 2019-02-23: 4 mg via INTRAVENOUS

## 2019-02-23 MED ORDER — CEFAZOLIN SODIUM-DEXTROSE 2-4 GM/100ML-% IV SOLN
2.0000 g | Freq: Three times a day (TID) | INTRAVENOUS | Status: AC
  Administered 2019-02-23 – 2019-02-24 (×3): 2 g via INTRAVENOUS
  Filled 2019-02-23 (×3): qty 100

## 2019-02-23 MED ORDER — PHENYLEPHRINE 40 MCG/ML (10ML) SYRINGE FOR IV PUSH (FOR BLOOD PRESSURE SUPPORT)
PREFILLED_SYRINGE | INTRAVENOUS | Status: DC | PRN
  Administered 2019-02-23: 120 ug via INTRAVENOUS
  Administered 2019-02-23: 80 ug via INTRAVENOUS
  Administered 2019-02-23: 120 ug via INTRAVENOUS

## 2019-02-23 MED ORDER — ROCURONIUM BROMIDE 10 MG/ML (PF) SYRINGE
PREFILLED_SYRINGE | INTRAVENOUS | Status: AC
  Filled 2019-02-23: qty 10

## 2019-02-23 MED ORDER — DEXAMETHASONE SODIUM PHOSPHATE 10 MG/ML IJ SOLN
INTRAMUSCULAR | Status: DC | PRN
  Administered 2019-02-23: 5 mg via INTRAVENOUS

## 2019-02-23 MED ORDER — CEFAZOLIN SODIUM-DEXTROSE 2-4 GM/100ML-% IV SOLN
INTRAVENOUS | Status: AC
  Filled 2019-02-23: qty 100

## 2019-02-23 MED ORDER — WARFARIN - PHARMACIST DOSING INPATIENT
Freq: Every day | Status: DC
  Administered 2019-02-23: 18:00:00

## 2019-02-23 MED ORDER — PROMETHAZINE HCL 25 MG/ML IJ SOLN: 6.2500 mg | INTRAMUSCULAR | Status: DC | PRN

## 2019-02-23 MED ORDER — SUCCINYLCHOLINE CHLORIDE 200 MG/10ML IV SOSY
PREFILLED_SYRINGE | INTRAVENOUS | Status: DC | PRN
  Administered 2019-02-23: 80 mg via INTRAVENOUS

## 2019-02-23 MED ORDER — 0.9 % SODIUM CHLORIDE (POUR BTL) OPTIME
TOPICAL | Status: DC | PRN
  Administered 2019-02-23: 1000 mL

## 2019-02-23 MED ORDER — CEFAZOLIN SODIUM-DEXTROSE 2-3 GM-%(50ML) IV SOLR
INTRAVENOUS | Status: DC | PRN
  Administered 2019-02-23: 2 g via INTRAVENOUS

## 2019-02-23 MED ORDER — WARFARIN SODIUM 5 MG PO TABS
5.0000 mg | ORAL_TABLET | Freq: Once | ORAL | Status: AC
  Administered 2019-02-23: 5 mg via ORAL
  Filled 2019-02-23: qty 1

## 2019-02-23 MED ORDER — SUCCINYLCHOLINE CHLORIDE 200 MG/10ML IV SOSY
PREFILLED_SYRINGE | INTRAVENOUS | Status: AC
  Filled 2019-02-23: qty 10

## 2019-02-23 MED ORDER — FENTANYL CITRATE (PF) 250 MCG/5ML IJ SOLN
INTRAMUSCULAR | Status: AC
  Filled 2019-02-23: qty 5

## 2019-02-23 MED ORDER — LIDOCAINE 2% (20 MG/ML) 5 ML SYRINGE
INTRAMUSCULAR | Status: DC | PRN
  Administered 2019-02-23: 60 mg via INTRAVENOUS

## 2019-02-23 MED ORDER — LACTATED RINGERS IV SOLN
INTRAVENOUS | Status: DC
  Administered 2019-02-23 – 2019-02-24 (×2): via INTRAVENOUS

## 2019-02-23 MED ORDER — ROCURONIUM BROMIDE 10 MG/ML (PF) SYRINGE
PREFILLED_SYRINGE | INTRAVENOUS | Status: DC | PRN
  Administered 2019-02-23: 20 mg via INTRAVENOUS

## 2019-02-23 MED ORDER — ACETAMINOPHEN 325 MG PO TABS
650.0000 mg | ORAL_TABLET | Freq: Four times a day (QID) | ORAL | Status: DC
  Administered 2019-02-23 – 2019-02-25 (×9): 650 mg via ORAL
  Filled 2019-02-23 (×9): qty 2

## 2019-02-23 MED ORDER — VANCOMYCIN HCL 500 MG IV SOLR
INTRAVENOUS | Status: AC
  Filled 2019-02-23: qty 500

## 2019-02-23 MED ORDER — PROPOFOL 10 MG/ML IV BOLUS
INTRAVENOUS | Status: AC
  Filled 2019-02-23: qty 20

## 2019-02-23 MED ORDER — PROPOFOL 10 MG/ML IV BOLUS
INTRAVENOUS | Status: DC | PRN
  Administered 2019-02-23: 90 mg via INTRAVENOUS

## 2019-02-23 MED ORDER — METHOCARBAMOL 500 MG PO TABS
500.0000 mg | ORAL_TABLET | Freq: Four times a day (QID) | ORAL | Status: DC | PRN
  Administered 2019-02-24 (×2): 500 mg via ORAL
  Filled 2019-02-23 (×2): qty 1

## 2019-02-23 MED ORDER — VANCOMYCIN HCL 500 MG IV SOLR
INTRAVENOUS | Status: DC | PRN
  Administered 2019-02-23: 500 mg via TOPICAL

## 2019-02-23 MED ORDER — ONDANSETRON HCL 4 MG/2ML IJ SOLN
INTRAMUSCULAR | Status: AC
  Filled 2019-02-23: qty 2

## 2019-02-23 MED ORDER — MIDAZOLAM HCL 2 MG/2ML IJ SOLN
INTRAMUSCULAR | Status: AC
  Filled 2019-02-23: qty 2

## 2019-02-23 MED ORDER — LIDOCAINE 2% (20 MG/ML) 5 ML SYRINGE
INTRAMUSCULAR | Status: AC
  Filled 2019-02-23: qty 5

## 2019-02-23 MED ORDER — FENTANYL CITRATE (PF) 100 MCG/2ML IJ SOLN: 25.0000 ug | INTRAMUSCULAR | Status: DC | PRN

## 2019-02-23 SURGICAL SUPPLY — 47 items
BIT DRILL CANN LRG QC 5X300 (BIT) ×1 IMPLANT
BNDG COHESIVE 6X5 TAN STRL LF (GAUZE/BANDAGES/DRESSINGS) ×2 IMPLANT
BRUSH SCRUB EZ PLAIN DRY (MISCELLANEOUS) ×2 IMPLANT
CHLORAPREP W/TINT 26 (MISCELLANEOUS) ×2 IMPLANT
COVER SURGICAL LIGHT HANDLE (MISCELLANEOUS) ×3 IMPLANT
COVER WAND RF STERILE (DRAPES) ×1 IMPLANT
DERMABOND ADVANCED (GAUZE/BANDAGES/DRESSINGS) ×1
DERMABOND ADVANCED .7 DNX12 (GAUZE/BANDAGES/DRESSINGS) ×1 IMPLANT
DRAPE C-ARMOR (DRAPES) ×2 IMPLANT
DRAPE HALF SHEET 40X57 (DRAPES) ×4 IMPLANT
DRAPE IMP U-DRAPE 54X76 (DRAPES) ×4 IMPLANT
DRAPE ORTHO SPLIT 77X108 STRL (DRAPES) ×2
DRAPE STERI IOBAN 125X83 (DRAPES) ×2 IMPLANT
DRAPE SURG 17X23 STRL (DRAPES) ×2 IMPLANT
DRAPE SURG ORHT 6 SPLT 77X108 (DRAPES) ×2 IMPLANT
DRAPE U-SHAPE 47X51 STRL (DRAPES) ×2 IMPLANT
DRSG MEPILEX BORDER 4X4 (GAUZE/BANDAGES/DRESSINGS) ×2 IMPLANT
ELECT REM PT RETURN 9FT ADLT (ELECTROSURGICAL) ×2
ELECTRODE REM PT RTRN 9FT ADLT (ELECTROSURGICAL) ×1 IMPLANT
GLOVE BIO SURGEON STRL SZ 6.5 (GLOVE) ×6 IMPLANT
GLOVE BIO SURGEON STRL SZ7.5 (GLOVE) ×7 IMPLANT
GLOVE BIOGEL PI IND STRL 6.5 (GLOVE) ×1 IMPLANT
GLOVE BIOGEL PI IND STRL 7.5 (GLOVE) ×1 IMPLANT
GLOVE BIOGEL PI INDICATOR 6.5 (GLOVE) ×1
GLOVE BIOGEL PI INDICATOR 7.5 (GLOVE) ×1
GOWN STRL REUS W/ TWL LRG LVL3 (GOWN DISPOSABLE) ×2 IMPLANT
GOWN STRL REUS W/TWL LRG LVL3 (GOWN DISPOSABLE) ×2
GUIDEWIRE THREADED 2.8 (WIRE) ×3 IMPLANT
KIT BASIN OR (CUSTOM PROCEDURE TRAY) ×2 IMPLANT
KIT TURNOVER KIT B (KITS) ×2 IMPLANT
MANIFOLD NEPTUNE II (INSTRUMENTS) ×1 IMPLANT
NS IRRIG 1000ML POUR BTL (IV SOLUTION) ×2 IMPLANT
PACK GENERAL/GYN (CUSTOM PROCEDURE TRAY) ×2 IMPLANT
PAD ARMBOARD 7.5X6 YLW CONV (MISCELLANEOUS) ×4 IMPLANT
SCREW CANN 6.5X100 32MM THRD (Screw) ×1 IMPLANT
SCREW CANN 6.5X105 32MM THRD (Screw) ×1 IMPLANT
SCREW CANN 6.5X90 (Screw) ×2 IMPLANT
SCREW CANN 6.5X90 32MM THD (Screw) IMPLANT
STAPLER VISISTAT 35W (STAPLE) ×2 IMPLANT
STOCKINETTE IMPERVIOUS LG (DRAPES) ×2 IMPLANT
SUT MNCRL AB 3-0 PS2 18 (SUTURE) ×2 IMPLANT
SUT VIC AB 2-0 CT1 27 (SUTURE) ×1
SUT VIC AB 2-0 CT1 TAPERPNT 27 (SUTURE) ×1 IMPLANT
TOWEL GREEN STERILE (TOWEL DISPOSABLE) ×4 IMPLANT
TOWEL GREEN STERILE FF (TOWEL DISPOSABLE) ×2 IMPLANT
TRAY FOLEY MTR SLVR 14FR STAT (SET/KITS/TRAYS/PACK) ×1 IMPLANT
WATER STERILE IRR 1000ML POUR (IV SOLUTION) ×2 IMPLANT

## 2019-02-23 NOTE — Plan of Care (Signed)
  Problem: Pain Managment: Goal: General experience of comfort will improve Outcome: Progressing   Problem: Safety: Goal: Ability to remain free from injury will improve Outcome: Progressing   Problem: Skin Integrity: Goal: Risk for impaired skin integrity will decrease Outcome: Progressing   

## 2019-02-23 NOTE — Transfer of Care (Signed)
Immediate Anesthesia Transfer of Care Note  Patient: Tina Wade  Procedure(s) Performed: CANNULATED HIP PINNING (Left Hip)  Patient Location: PACU  Anesthesia Type:General  Level of Consciousness: drowsy and patient cooperative  Airway & Oxygen Therapy: Patient Spontanous Breathing and Patient connected to nasal cannula oxygen  Post-op Assessment: Report given to RN, Post -op Vital signs reviewed and stable and Patient moving all extremities  Post vital signs: Reviewed and stable  Last Vitals:  Vitals Value Taken Time  BP 127/58 02/23/19 1105  Temp    Pulse 71 02/23/19 1106  Resp    SpO2 93 % 02/23/19 1106  Vitals shown include unvalidated device data.  Last Pain:  Vitals:   02/23/19 0825  TempSrc: Oral  PainSc:          Complications: No apparent anesthesia complications

## 2019-02-23 NOTE — Anesthesia Postprocedure Evaluation (Signed)
Anesthesia Post Note  Patient: Tina Wade  Procedure(s) Performed: CANNULATED HIP PINNING (Left Hip)     Patient location during evaluation: PACU Anesthesia Type: General Level of consciousness: awake and alert Pain management: pain level controlled Vital Signs Assessment: post-procedure vital signs reviewed and stable Respiratory status: spontaneous breathing, nonlabored ventilation, respiratory function stable and patient connected to nasal cannula oxygen Cardiovascular status: blood pressure returned to baseline and stable Postop Assessment: no apparent nausea or vomiting Anesthetic complications: no    Last Vitals:  Vitals:   02/23/19 1205 02/23/19 1220  BP: (!) 131/46 (!) 127/55  Pulse: 85   Resp: 17 (!) 22  Temp:    SpO2: 93%     Last Pain:  Vitals:   02/23/19 1135  TempSrc:   PainSc: 0-No pain                 Tiajuana Amass

## 2019-02-23 NOTE — Op Note (Signed)
Orthopaedic Surgery Operative Note (CSN: [id number] ) Date of Surgery: 02/23/2019  Admit Date: 02/22/2019   Diagnoses: Pre-Op Diagnoses: Left femoral neck fracture  Post-Op Diagnosis: Same  Procedures: CPT 27235-Percutaneous fixation of left femoral neck fracture  Surgeons : Primary: Shona Needles, MD  Assistant: Patrecia Pace, PA-C  Location: OR 3   Anesthesia:General  Antibiotics: Ancef 2g preop   Tourniquet time:None  Estimated Blood RJJO:84 mL  Complications:None   Specimens:None   Implants: Implant Name Type Inv. Item Serial No. Manufacturer Lot No. LRB No. Used Action  SCREW CANN 7.3X32MM - [id number] Screw SCREW CANN 7.3X32MM  [employer]  Left 1 Implanted  SCREW CANN 7.3X32MM - [id number] Screw SCREW CANN 7.3X32MM  [employer]  Left 1 Implanted  SCREW CANN 7.3X32MM - [id number] Screw SCREW CANN 7.3X32MM  [employer]  Left 1 Implanted     Indications for Surgery: 85 year old female with a history of atrial fibrillation with mechanical heart valve on Coumadin with a ground-level fall and nondisplaced/minimally displaced left femoral neck fracture. The patient has a relatively nondisplaced left femoral neck fracture.  I feel that percutaneous fixation with the cannulated screws would be most appropriate.  She does have some pre-existing arthritis however I do not feel that arthroplasty is warranted.  I discussed risks and benefits with the patient and her daughter.  Risks included but not limited to bleeding, infection, malunion, nonunion, hardware failure, hardware cut out, progression of arthritis, need for total or partial hip replacement, nerve and blood vessel injury, DVT, loss of ambulatory capabilities.  The patient and her daughter agreed to proceed with surgery.   Operative Findings: Percutaneous fixation of left femoral neck fracture using Synthes 6.5 mm partially-threaded cannulated screws.  Procedure: The patient was identified in the preoperative  holding area. Consent was confirmed with the patient and their family and all questions were answered. The operative extremity was marked after confirmation with the patient they were then brought back to the operating room by our anesthesia colleagues. They were placed under general anesthesia and carefully transferred to a radiolucent flat top table. A bump was placed under the operative hip and fluoroscopy was used to confirm that the fracture had not displaced. The operative extremity was then prepped and draped in usual sterile fashion. A preoperative timeout was performed to verify the patient, the procedure, and the extremity. Preoperative antibiotics were dosed.  Using fluoroscopy as a guide I marked out an incision. I carried this down through skin and the IT band. I used 2.64mm guidepins to direct up the femoral neck in an inverted triangle. I placed anterior superior guidepin, a posterior superior guidepin and an inferior guidepin. I confirmed positioning with fluoroscopy and then proceeded to measure and placed 6.77mm screws across the femoral neck.  The lateral cortex was drilled.  I then placed the inferior screw with a washer.  The remainder of the screws were placed without washers.  Final fluoroscopic images were obtained confirming length on all screws. An approach withdrawal technique was used to make sure all screws were extra-articular. The incision was irrigated and closed with 2-0 vicryl, 3-0 monocryl and dermabond. A dressing was placed. The patient was awoken from anesthesia and taken to the PACU in stable condition.  Post Op Plan/Instructions: Patient will be weightbearing as tolerated to left lower extremity.  She will receive Ancef for surgical prophylaxis.  She may be restarted on her Coumadin tonight.  She will mobilize with physical and Occupational Therapy.  I was present and  performed the entire surgery.  Patrecia Pace, PA-C did assist me throughout the case. An assistant  was necessary given the difficulty in approach, maintenance of reduction and ability to instrument the fracture.   Katha Hamming, MD [facility]

## 2019-02-23 NOTE — Progress Notes (Addendum)
0825 Pt to Short stay, maint NPO post midnight, pt is A&O x4. Report was given to [facility]. 1335 Pt is back from PACU, A&O x4. Left hip dressing dry and intact. Denies pain at this time. Pt's daughter Tina Wade present in the room.

## 2019-02-23 NOTE — Progress Notes (Signed)
Progress Note   Tina Wade RKY:[id number] DOB: 1929-05-29 DOA: 02/22/2019  PCP: Bernerd Limbo, MD  Patient coming from: Home  Chief Complaint: Fall.  HPI: Tina Wade is a 85 y.o. female with history of mechanical mitral valve, hypertension and, A. fib was walking at home when she slipped and fell did not hit her head or lose consciousness.  Hurt her left side and was brought to the ER. In the ER x-ray showed left hip fracture on-call orthopedic surgeon Dr. Doreatha Martin was consulted.  CT head C-spine were negative.  COVID-19 test was negative.  EKG shows A. fib rest of the labs show hemoglobin of 11.7 platelets 192 creatinine 1.7 INR 2.   Subjective: No acute issues/events since admission. Pain well controlled. Denies shortness of breath, chest pain, headache, fevers, chills.  Assessment/Plan Principal Problem:   Closed left hip fracture, initial encounter [facility]) Active Problems:   H/O mitral valve replacement   Chronic atrial fibrillation   Essential hypertension  Closed left hip fracture status post mechanical fall  -appears to be at moderate risk for intermediate risk procedure. -Tentative percutaneous fixation with cannulated screws later today 02/23/19 -INR 2.4  Mechanical mitral valve on Coumadin  -on hold secondary to possible surgery -follow INR - transition to heparin gtt perioperatively if necessary - defer to Ortho when to resume coumadin post operatively  Chronic atrial fibrillation rate controlled.   -Coumadin on hold for surgery -Patient not on any rate control per home med list - continue to follow  History of TIA  -Continue on statins -coumadin is on hold - also on 81mg  asa at home  COPD, not in acute exacerbation -not actively wheezing on inhalers.  Hypertension, essential, well controlled -Continue on Cozaar.  Hypothyroidism  -Continue Synthroid.   DVT prophylaxis: SCDs in anticipation of surgery. Code Status: DNR confirmed with patient.  Disposition Plan: Pending clinical status - may benefit from short rehab stay - defer to PT/OT  Consults called: Orthopedics. Admission status: Inpatient.   Prior to Admission medications   Medication Sig Start Date End Date Taking? Authorizing Provider  aspirin 81 MG tablet Take 81 mg by mouth daily.      [provider]  calcium carbonate (OS-CAL) 600 MG TABS tablet Take 600 mg by mouth 2 (two) times daily with a meal.    [provider]  furosemide (LASIX) 40 MG tablet Take 40 mg by mouth daily.     [provider]  gabapentin (NEURONTIN) 100 MG capsule Take 1 capsule (100 mg total) by mouth at bedtime and may repeat dose one time if needed. 11/04/18 11/04/19  Garvin Fila, MD  levothyroxine (SYNTHROID, LEVOTHROID) 100 MCG tablet Take 100 mcg by mouth daily.     [provider]  losartan (COZAAR) 25 MG tablet Take 1 tablet (25 mg total) by mouth daily. 03/27/14   Leonie Man, MD  montelukast (SINGULAIR) 10 MG tablet Take 10 mg by mouth at bedtime.    [provider]  rosuvastatin (CRESTOR) 10 MG tablet Take 10 mg by mouth daily. 02/14/18   [provider]  Tiotropium Bromide-Olodaterol (STIOLTO RESPIMAT) 2.5-2.5 MCG/ACT AERS USE 2 INHALATIONS DAILY 06/17/18   [provider]  warfarin (COUMADIN) 5 MG tablet TAKE ONE TO ONE AND ONE-HALF TABLETS DAILY OR AS DIRECTED Patient taking differently: Take 5-7.5 mg by mouth daily.     Rockne Menghini, RPH-CPP    Physical Exam: Constitutional: Moderately built and nourished. Vitals:   02/22/19  2018 02/22/19 2043 02/22/19 2333 02/23/19 0344  BP: (!) 124/52  (!) 139/51 (!) 137/54  Pulse: 72 75 64 64  Resp:  14    Temp: 98.5 F (36.9 C)  98.4 F (36.9 C) 98.6 F (37 C)  TempSrc: Oral  Oral Oral  SpO2: 94% 99% 91% 95%  Weight:      Height:       General:  Pleasantly resting in bed, No acute distress. HEENT:  Normocephalic atraumatic.  Sclerae nonicteric, noninjected.   Extraocular movements intact bilaterally. Neck:  Without mass or deformity.  Trachea is midline. Lungs:  Clear to auscultate bilaterally without rhonchi, wheeze, or rales. Heart:  Regular rate and rhythm.  Without murmurs, rubs, or gallops. Abdomen:  Soft, nontender, nondistended.  Without guarding or rebound. Extremities: Without cyanosis, clubbing, edema, or obvious deformity. Vascular:  Dorsalis pedis and posterior tibial pulses palpable bilaterally. Skin:  Warm and dry, no erythema, no ulcerations.    Labs on Admission: I have personally reviewed following labs and imaging studies  CBC: Recent Labs  Lab 02/22/19 0200 02/23/19 0604  WBC 9.9 7.9  NEUTROABS 8.0*  --   HGB 11.7* 11.2*  HCT 40.0 38.9  MCV 94.8 97.0  PLT 192 270*   Basic Metabolic Panel: Recent Labs  Lab 02/22/19 0200 02/23/19 0604  NA 137 136  K 4.0 4.8  CL 101 98  CO2 26 27  GLUCOSE 152* 109*  BUN 19 16  CREATININE 0.78 0.68  CALCIUM 9.1 9.3   GFR: Estimated Creatinine Clearance: 52.2 mL/min (by C-G formula based on SCr of 0.68 mg/dL). Liver Function Tests: No results for input(s): AST, ALT, ALKPHOS, BILITOT, PROT, ALBUMIN in the last 168 hours. No results for input(s): LIPASE, AMYLASE in the last 168 hours. No results for input(s): AMMONIA in the last 168 hours. Coagulation Profile: Recent Labs  Lab 02/22/19 0200 02/23/19 0604  INR 2.6* 2.4*   Cardiac Enzymes: No results for input(s): CKTOTAL, CKMB, CKMBINDEX, TROPONINI in the last 168 hours. BNP (last 3 results) No results for input(s): PROBNP in the last 8760 hours. HbA1C: No results for input(s): HGBA1C in the last 72 hours. CBG: No results for input(s): GLUCAP in the last 168 hours. Lipid Profile: No results for input(s): CHOL, HDL, LDLCALC, TRIG, CHOLHDL, LDLDIRECT in the last 72 hours. Thyroid Function Tests: No results for input(s): TSH, T4TOTAL, FREET4, T3FREE, THYROIDAB in the last 72 hours. Anemia Panel: No results for  input(s): VITAMINB12, FOLATE, FERRITIN, TIBC, IRON, RETICCTPCT in the last 72 hours. Urine analysis:    Component Value Date/Time   COLORURINE YELLOW 09/06/2010 0057   APPEARANCEUR CLOUDY (A) 09/06/2010 0057   LABSPEC 1.020 09/06/2010 0057   PHURINE 7.0 09/06/2010 0057   GLUCOSEU NEGATIVE 11/06/2009 1300   HGBUR NEGATIVE 09/06/2010 0057   BILIRUBINUR NEGATIVE 09/06/2010 0057   KETONESUR NEGATIVE 09/06/2010 0057   PROTEINUR NEGATIVE 09/06/2010 0057   UROBILINOGEN 0.2 09/06/2010 0057   NITRITE NEGATIVE 09/06/2010 0057   LEUKOCYTESUR NEGATIVE 09/06/2010 0057    Recent Results (from the past 240 hour(s))  SARS Coronavirus 2 [facility] order, Performed in [facility] hospital lab) Nasopharyngeal Nasopharyngeal Swab     Status: None   Collection Time: 02/22/19  2:26 AM   Specimen: Nasopharyngeal Swab  Result Value Ref Range Status   SARS Coronavirus 2 NEGATIVE NEGATIVE Final    Comment: (NOTE) If result is NEGATIVE SARS-CoV-2 target nucleic acids are NOT DETECTED. The SARS-CoV-2 RNA is generally detectable in upper and lower  respiratory  specimens during the acute phase of infection. The lowest  concentration of SARS-CoV-2 viral copies this assay can detect is 250  copies / mL. A negative result does not preclude SARS-CoV-2 infection  and should not be used as the sole basis for treatment or other  patient management decisions.  A negative result may occur with  improper specimen collection / handling, submission of specimen other  than nasopharyngeal swab, presence of viral mutation(s) within the  areas targeted by this assay, and inadequate number of viral copies  (<250 copies / mL). A negative result must be combined with clinical  observations, patient history, and epidemiological information. If result is POSITIVE SARS-CoV-2 target nucleic acids are DETECTED. The SARS-CoV-2 RNA is generally detectable in upper and lower  respiratory specimens dur ing the acute phase of  infection.  Positive  results are indicative of active infection with SARS-CoV-2.  Clinical  correlation with patient history and other diagnostic information is  necessary to determine patient infection status.  Positive results do  not rule out bacterial infection or co-infection with other viruses. If result is PRESUMPTIVE POSTIVE SARS-CoV-2 nucleic acids MAY BE PRESENT.   A presumptive positive result was obtained on the submitted specimen  and confirmed on repeat testing.  While 2019 novel coronavirus  (SARS-CoV-2) nucleic acids may be present in the submitted sample  additional confirmatory testing may be necessary for epidemiological  and / or clinical management purposes  to differentiate between  SARS-CoV-2 and other Sarbecovirus currently known to infect humans.  If clinically indicated additional testing with an alternate test  methodology [phone]) is advised. The SARS-CoV-2 RNA is generally  detectable in upper and lower respiratory sp ecimens during the acute  phase of infection. The expected result is Negative. Fact Sheet for Patients:  StrictlyIdeas.no Fact Sheet for Healthcare Providers: BankingDealers.co.za This test is not yet approved or cleared by the [place] FDA and has been authorized for detection and/or diagnosis of SARS-CoV-2 by FDA under an Emergency Use Authorization (EUA).  This EUA will remain in effect (meaning this test can be used) for the duration of the COVID-19 declaration under Section 564(b)(1) of the Act, 21 U.S.C. section 360bbb-3(b)(1), unless the authorization is terminated or revoked sooner. Performed at [facility], [place] [address] [place], [place] [address]   Surgical pcr screen     Status: None   Collection Time: 02/23/19  2:40 AM   Specimen: Nasal Mucosa; Nasal Swab  Result Value Ref Range Status   MRSA, PCR NEGATIVE NEGATIVE Final   Staphylococcus aureus NEGATIVE NEGATIVE  Final    Comment: (NOTE) The Xpert SA Assay (FDA approved for NASAL specimens in patients 57 years of age and older), is one component of a comprehensive surveillance program. It is not intended to diagnose infection nor to guide or monitor treatment. Performed at [facility], [place] [address], [place], [place] [address]      Radiological Exams on Admission: Dg Chest 1 View  Result Date: 02/22/2019 CLINICAL DATA:  Recent fall with chest pain, initial encounter EXAM: CHEST  1 VIEW COMPARISON:  01/10/2012 FINDINGS: Cardiac shadow is mildly enlarged but stable. Aortic calcifications are again seen and stable. The lungs are well aerated bilaterally. Mild right basilar atelectasis is seen. Postsurgical changes are noted. IMPRESSION: Mild right basilar atelectasis. Electronically Signed   By: Inez Catalina M.D.   On: 02/22/2019 02:05   Ct Head Wo Contrast  Result Date: 02/22/2019 CLINICAL DATA:  Recent fall with  headaches and neck pain, initial encounter EXAM: CT HEAD WITHOUT CONTRAST CT CERVICAL SPINE WITHOUT CONTRAST TECHNIQUE: Multidetector CT imaging of the head and cervical spine was performed following the standard protocol without intravenous contrast. Multiplanar CT image reconstructions of the cervical spine were also generated. COMPARISON:  05/24/2018 FINDINGS: CT HEAD FINDINGS Brain: Chronic atrophic changes and white matter ischemic changes are seen. Prior lacunar infarct in the medial aspect of the left temporal lobe is seen. Left thalamic lacunar infarct is noted as well. Changes are stable from the prior exam. No acute hemorrhage or acute infarction is seen. Vascular: No hyperdense vessel or unexpected calcification. Skull: Normal. Negative for fracture or focal lesion. Sinuses/Orbits: No acute finding. Other: None. CT CERVICAL SPINE FINDINGS Alignment: Within normal limits. Skull base and vertebrae: 7 cervical segments are well visualized. Vertebral body height is well maintained. Facet  hypertrophic changes and osteophytic changes are seen. Disc space narrowing at C5-6 C6-7 is noted. No acute fracture or acute facet abnormality is noted Soft tissues and spinal canal: Surrounding soft tissue structures are within normal limits. Upper chest: Visualized lung apices are within normal limits. Other: None IMPRESSION: CT of the head: Chronic atrophic and ischemic changes. No acute abnormality noted. CT of the cervical spine: Multilevel degenerative change without acute abnormality. Electronically Signed   By: Inez Catalina M.D.   On: 02/22/2019 01:57   Ct Cervical Spine Wo Contrast  Result Date: 02/22/2019 CLINICAL DATA:  Recent fall with headaches and neck pain, initial encounter EXAM: CT HEAD WITHOUT CONTRAST CT CERVICAL SPINE WITHOUT CONTRAST TECHNIQUE: Multidetector CT imaging of the head and cervical spine was performed following the standard protocol without intravenous contrast. Multiplanar CT image reconstructions of the cervical spine were also generated. COMPARISON:  05/24/2018 FINDINGS: CT HEAD FINDINGS Brain: Chronic atrophic changes and white matter ischemic changes are seen. Prior lacunar infarct in the medial aspect of the left temporal lobe is seen. Left thalamic lacunar infarct is noted as well. Changes are stable from the prior exam. No acute hemorrhage or acute infarction is seen. Vascular: No hyperdense vessel or unexpected calcification. Skull: Normal. Negative for fracture or focal lesion. Sinuses/Orbits: No acute finding. Other: None. CT CERVICAL SPINE FINDINGS Alignment: Within normal limits. Skull base and vertebrae: 7 cervical segments are well visualized. Vertebral body height is well maintained. Facet hypertrophic changes and osteophytic changes are seen. Disc space narrowing at C5-6 C6-7 is noted. No acute fracture or acute facet abnormality is noted Soft tissues and spinal canal: Surrounding soft tissue structures are within normal limits. Upper chest: Visualized lung  apices are within normal limits. Other: None IMPRESSION: CT of the head: Chronic atrophic and ischemic changes. No acute abnormality noted. CT of the cervical spine: Multilevel degenerative change without acute abnormality. Electronically Signed   By: Inez Catalina M.D.   On: 02/22/2019 01:57   Ct Hip Left Wo Contrast  Result Date: 02/22/2019 CLINICAL DATA:  85 year old female with history of left hip pain. Evaluate for left hip fracture. EXAM: CT OF THE LEFT HIP WITHOUT CONTRAST TECHNIQUE: Multidetector CT imaging of the left hip was performed according to the standard protocol. Multiplanar CT image reconstructions were also generated. COMPARISON:  No priors.  Left hip radiograph 02/22/2019. FINDINGS: Bones/Joint/Cartilage Nondisplaced subcapital fracture again noted. No other acute displaced fracture identified in the visualized portions of the axial and appendicular skeleton. Femoral head remains located. Joint space narrowing, subchondral sclerosis, subchondral cyst formation and osteophyte formation in the hip joint, compatible with moderate osteoarthritis. Ligaments  Suboptimally assessed by CT. Muscles and Tendons Unremarkable. Soft tissues Unremarkable. IMPRESSION: 1. Nondisplaced subcapital left hip fracture redemonstrated, as above. 2. Moderate left hip joint osteoarthritis. Electronically Signed   By: Vinnie Langton M.D.   On: 02/22/2019 08:22   Dg Hip Unilat With Pelvis 2-3 Views Left  Result Date: 02/22/2019 CLINICAL DATA:  Recent fall with left hip pain, initial encounter EXAM: DG HIP (WITH OR WITHOUT PELVIS) 2-3V LEFT COMPARISON:  None. FINDINGS: Pelvic ring is intact. There is a subcapital left femoral neck fracture with mild impaction at the fracture site. No other focal abnormality is noted. IMPRESSION: Subcapital left femoral neck fracture. Electronically Signed   By: Inez Catalina M.D.   On: 02/22/2019 02:06    EKG: Independently reviewed.  A. fib rate controlled.  Little Ishikawa  DO [facility] Pager [phone].  If 7PM-7AM, please contact night-coverage [email or web address] Password [email or web address]  02/23/2019, 7:30 AM

## 2019-02-23 NOTE — Anesthesia Preprocedure Evaluation (Addendum)
Anesthesia Evaluation  Patient identified by MRN, date of birth, ID band Patient awake    Reviewed: Allergy & Precautions, NPO status , Patient's Chart, lab work & pertinent test results  Airway Mallampati: II  TM Distance: >3 FB Neck ROM: Full    Dental  (+) Dental Advisory Given, Edentulous Upper, Edentulous Lower   Pulmonary COPD,  COPD inhaler, Current Smoker and Patient abstained from smoking.,    breath sounds clear to auscultation       Cardiovascular hypertension, Pt. on medications + dysrhythmias Atrial Fibrillation + Valvular Problems/Murmurs (mechanical MV)  Rhythm:Irregular Rate:Normal     Neuro/Psych TIACVA    GI/Hepatic negative GI ROS, Neg liver ROS,   Endo/Other  negative endocrine ROS  Renal/GU negative Renal ROS     Musculoskeletal   Abdominal   Peds  Hematology  (+) anemia ,   Anesthesia Other Findings   Reproductive/Obstetrics                           Lab Results  Component Value Date   WBC 7.9 02/23/2019   HGB 11.2 (L) 02/23/2019   HCT 38.9 02/23/2019   MCV 97.0 02/23/2019   PLT 144 (L) 02/23/2019   Lab Results  Component Value Date   CREATININE 0.68 02/23/2019   BUN 16 02/23/2019   NA 136 02/23/2019   K 4.8 02/23/2019   CL 98 02/23/2019   CO2 27 02/23/2019   COVID-19 Labs  No results for input(s): DDIMER, FERRITIN, LDH, CRP in the last 72 hours.  Lab Results  Component Value Date   [place] NEGATIVE 02/22/2019    Anesthesia Physical Anesthesia Plan  ASA: III  Anesthesia Plan: General   Post-op Pain Management:    Induction: Intravenous  PONV Risk Score and Plan: 2 and Dexamethasone, Ondansetron and Treatment may vary due to age or medical condition  Airway Management Planned: Oral ETT  Additional Equipment: None  Intra-op Plan:   Post-operative Plan: Extubation in OR  Informed Consent: I have reviewed the patients History and  Physical, chart, labs and discussed the procedure including the risks, benefits and alternatives for the proposed anesthesia with the patient or authorized representative who has indicated his/her understanding and acceptance.     Dental advisory given  Plan Discussed with: CRNA, Anesthesiologist and Surgeon  Anesthesia Plan Comments:        Anesthesia Quick Evaluation

## 2019-02-23 NOTE — Interval H&P Note (Signed)
History and Physical Interval Note:  02/23/2019 9:49 AM  Tina Wade  has presented today for surgery, with the diagnosis of Left femoral neck fracture.  The various methods of treatment have been discussed with the patient and family. After consideration of risks, benefits and other options for treatment, the patient has consented to  Procedure(s): CANNULATED HIP PINNING (Left) as a surgical intervention.  The patient's history has been reviewed, patient examined, no change in status, stable for surgery.  I have reviewed the patient's chart and labs.  Questions were answered to the patient's satisfaction.     Lennette Bihari P Haddix

## 2019-02-23 NOTE — OR Nursing (Signed)
Attempted foley catheter placement with a 16 fr catheter. Catheter was too big. Catheter was successfully placed with a 14 fr. Patient tolerated procedure well.

## 2019-02-23 NOTE — Progress Notes (Signed)
[facility] for Warfarin Indication: Afib and Mechanical MVR (1996)  No Known Allergies  Patient Measurements: Height: 5\' 6"  (167.6 cm) Weight: 172 lb (78 kg) IBW/kg (Calculated) : 59.3  Vital Signs: Temp: 97.5 F (36.4 C) (08/09 1327) Temp Source: Oral (08/09 1327) BP: 122/55 (08/09 1327) Pulse Rate: 65 (08/09 1327)  Labs: Recent Labs    02/22/19 0200 02/23/19 0604  HGB 11.7* 11.2*  HCT 40.0 38.9  PLT 192 144*  LABPROT 27.2* 26.0*  INR 2.6* 2.4*  CREATININE 0.78 0.68   Estimated Creatinine Clearance: 52.2 mL/min (by C-G formula based on SCr of 0.68 mg/dL).  Medical History: Past Medical History:  Diagnosis Date  . Arrhythmia    chronic atrial fib. with controlled rate ,on warfarin therapy  . High cholesterol   . Hypertension   . Lower extremity edema    mild -stable  . Mitral valve stenosis 10/1994   s/p St. Jude mechanical valve placement -39M-101 MODEL  . Stroke ([place]) 08/2010   from office visit 2013- the setting of normal INR of 2.8 ;therefore increased goal to 3.5  . Thyroid disease    hypothyroidism on synthroid   Medications:  Scheduled:  . acetaminophen  650 mg Oral Q6H  . arformoterol  15 mcg Nebulization BID  . calcium carbonate  500 mg of elemental calcium Oral BID WC  . feeding supplement (ENSURE ENLIVE)  237 mL Oral BID BM  . gabapentin  100 mg Oral QHS,MR X 1  . levothyroxine  112 mcg Oral Q0600  . losartan  25 mg Oral Daily  . montelukast  10 mg Oral QHS  . multivitamin with minerals  1 tablet Oral Daily  . rosuvastatin  10 mg Oral Daily  . umeclidinium bromide  1 puff Inhalation Daily  . warfarin  5 mg Oral ONCE-1800  . Warfarin - Pharmacist Dosing Inpatient   Does not apply q1800   Assessment: 23 yoF s/p L femoral neck fracture repair 8.9. Warfarin PTA 5mg  daily except 2.5mg  MWF, last dose 8/7. - INR 2.6 on admit  - MD ordered Warfarin 5mg  today  Goal of Therapy:  INR 2-3 Monitor platelets by  anticoagulation protocol: Yes   Plan:  Warfarin 5mg  x1 at 1800 Daily PT/INR, CBC  Minda Ditto PharmD [phone] 02/23/2019,1:37 PM

## 2019-02-23 NOTE — Anesthesia Procedure Notes (Signed)
Procedure Name: Intubation Date/Time: 02/23/2019 10:01 AM Performed by: Moshe Salisbury, CRNA Pre-anesthesia Checklist: Patient identified, Emergency Drugs available, Suction available and Patient being monitored Patient Re-evaluated:Patient Re-evaluated prior to induction Oxygen Delivery Method: Circle System Utilized Preoxygenation: Pre-oxygenation with 100% oxygen Induction Type: IV induction Ventilation: Mask ventilation without difficulty Laryngoscope Size: Mac and 3 Grade View: Grade I Tube type: Oral Tube size: 7.5 mm Number of attempts: 1 Airway Equipment and Method: Stylet Placement Confirmation: ETT inserted through vocal cords under direct vision,  positive ETCO2 and breath sounds checked- equal and bilateral Secured at: 20 cm Tube secured with: Tape Dental Injury: Teeth and Oropharynx as per pre-operative assessment

## 2019-02-24 ENCOUNTER — Other Ambulatory Visit: Payer: Self-pay

## 2019-02-24 LAB — CBC
HCT: 35.5 % — ABNORMAL LOW (ref 36.0–46.0)
Hemoglobin: 10.6 g/dL — ABNORMAL LOW (ref 12.0–15.0)
MCH: 28.4 pg (ref 26.0–34.0)
MCHC: 29.9 g/dL — ABNORMAL LOW (ref 30.0–36.0)
MCV: 95.2 fL (ref 80.0–100.0)
Platelets: 136 10*3/uL — ABNORMAL LOW (ref 150–400)
RBC: 3.73 MIL/uL — ABNORMAL LOW (ref 3.87–5.11)
RDW: 15.1 % (ref 11.5–15.5)
WBC: 8.7 10*3/uL (ref 4.0–10.5)
nRBC: 0 % (ref 0.0–0.2)

## 2019-02-24 LAB — PROTIME-INR
INR: 2.2 — ABNORMAL HIGH (ref 0.8–1.2)
Prothrombin Time: 23.7 seconds — ABNORMAL HIGH (ref 11.4–15.2)

## 2019-02-24 LAB — BASIC METABOLIC PANEL
Anion gap: 7 (ref 5–15)
BUN: 20 mg/dL (ref 8–23)
CO2: 29 mmol/L (ref 22–32)
Calcium: 9.4 mg/dL (ref 8.9–10.3)
Chloride: 98 mmol/L (ref 98–111)
Creatinine, Ser: 0.75 mg/dL (ref 0.44–1.00)
GFR calc Af Amer: >60 mL/min (ref >60)
GFR calc non Af Amer: >60 mL/min (ref >60)
Glucose, Bld: 125 mg/dL — ABNORMAL HIGH (ref 70–99)
Potassium: 5.2 mmol/L — ABNORMAL HIGH (ref 3.5–5.1)
Sodium: 134 mmol/L — ABNORMAL LOW (ref 135–145)

## 2019-02-24 LAB — VITAMIN D 25 HYDROXY (VIT D DEFICIENCY, FRACTURES): Vit D, 25-Hydroxy: 40.3 ng/mL (ref 30.0–100.0)

## 2019-02-24 MED ORDER — WARFARIN SODIUM 5 MG PO TABS
5.0000 mg | ORAL_TABLET | Freq: Once | ORAL | Status: AC
  Administered 2019-02-24: 5 mg via ORAL
  Filled 2019-02-24: qty 1

## 2019-02-24 NOTE — Evaluation (Signed)
Occupational Therapy Evaluation Patient Details Name: Tina Wade MRN: [id number] DOB: 15-Jul-1929 Today's Date: 02/24/2019    History of Present Illness Patient is an 85 year old female admitted after fall at home. Sustained a left hip fracture and is s/p pinning. PMH to includeL CVA, TIA, hypertension, hyperlipidemia, Afib.   Clinical Impression   Pt with decline in function and safety with ADLs and ADL mobility with impaired strength, balance and endurance. Pt lives at home with her daughter, uses a cane for mobility and was independent with ADLs/selfcare. Pt now requires mod - max A with LB ADLs, mod A with toileting and mod A with transfers. Pt would benefit from acute OT services to address impairments to maximize level of function and safety    Follow Up Recommendations  Home health OT    Equipment Recommendations  3 in 1 bedside commode;Other (comment)(reacher)    Recommendations for Other Services       Precautions / Restrictions Precautions Precautions: Fall Restrictions Weight Bearing Restrictions: No LLE Weight Bearing: Weight bearing as tolerated      Mobility Bed Mobility               General bed mobility comments: pt in recliner upon OT arrival  Transfers Overall transfer level: Needs assistance Equipment used: Rolling walker (2 wheeled) Transfers: Sit to/from Stand Sit to Stand: From elevated surface;Mod assist         General transfer comment: requires increased time    Balance Overall balance assessment: Needs assistance Sitting-balance support: Bilateral upper extremity supported;Feet supported Sitting balance-Leahy Scale: Fair     Standing balance support: Bilateral upper extremity supported;During functional activity Standing balance-Leahy Scale: Poor                             ADL either performed or assessed with clinical judgement   ADL Overall ADL's : Needs assistance/impaired Eating/Feeding: Set up;Sitting    Grooming: Wash/dry hands;Wash/dry face;Min guard;Sitting   Upper Body Bathing: Min guard;Sitting   Lower Body Bathing: Maximal assistance   Upper Body Dressing : Minimal assistance;Sitting   Lower Body Dressing: Maximal assistance   Toilet Transfer: Moderate assistance;Cueing for safety;Cueing for sequencing;Ambulation;RW   Toileting- Clothing Manipulation and Hygiene: Maximal assistance       Functional mobility during ADLs: Moderate assistance;Rolling walker;Cueing for sequencing;Cueing for safety       Vision Baseline Vision/History: Wears glasses Wears Glasses: At all times Patient Visual Report: No change from baseline       Perception     Praxis      Pertinent Vitals/Pain Pain Assessment: 0-10 Pain Score: 6  Pain Location: L hip Pain Descriptors / Indicators: Aching;Discomfort;Operative site guarding;Sore;Grimacing;Guarding Pain Intervention(s): Limited activity within patient's tolerance;Monitored during session;Repositioned     Hand Dominance Left   Extremity/Trunk Assessment Upper Extremity Assessment Upper Extremity Assessment: Generalized weakness   Lower Extremity Assessment Lower Extremity Assessment: Defer to PT evaluation   Cervical / Trunk Assessment Cervical / Trunk Assessment: Normal   Communication Communication Communication: No difficulties   Cognition Arousal/Alertness: Awake/alert Behavior During Therapy: WFL for tasks assessed/performed Overall Cognitive Status: Within Functional Limits for tasks assessed                                     General Comments       Exercises     Shoulder Instructions  Home Living Family/patient expects to be discharged to:: Private residence Living Arrangements: Children Available Help at Discharge: Family;Available 24 hours/day Type of Home: House Home Access: Level entry     Home Layout: One level     Bathroom Shower/Tub: Teacher, early years/pre:  Standard     Home Equipment: Environmental consultant - 4 wheels;Grab bars - tub/shower          Prior Functioning/Environment Level of Independence: Independent with assistive device(s)        Comments: ambulates with rollator when needed, lives with daughter who is currently there working from home        OT Problem List: Decreased strength;Impaired balance (sitting and/or standing);Pain;Decreased activity tolerance;Decreased knowledge of use of DME or AE      OT Treatment/Interventions: Self-care/ADL training;DME and/or AE instruction;Therapeutic activities;Therapeutic exercise;Patient/family education    OT Goals(Current goals can be found in the care plan section) Acute Rehab OT Goals Patient Stated Goal: to return home with daughter OT Goal Formulation: With patient Time For Goal Achievement: 03/03/19 ADL Goals Pt Will Perform Grooming: with min assist;with min guard assist;standing Pt Will Perform Upper Body Bathing: with supervision;with set-up;sitting Pt Will Perform Lower Body Bathing: with mod assist Pt Will Perform Upper Body Dressing: with min guard assist;with supervision;with set-up;sitting Pt Will Transfer to Toilet: with min assist;ambulating;bedside commode Pt Will Perform Toileting - Clothing Manipulation and hygiene: with mod assist;sitting/lateral leans;sit to/from stand  OT Frequency: Min 2X/week   Barriers to D/C: Decreased caregiver support          Co-evaluation              AM-PAC OT "6 Clicks" Daily Activity     Outcome Measure Help from another person eating meals?: None Help from another person taking care of personal grooming?: A Little Help from another person toileting, which includes using toliet, bedpan, or urinal?: A Lot Help from another person bathing (including washing, rinsing, drying)?: A Lot Help from another person to put on and taking off regular upper body clothing?: A Little Help from another person to put on and taking off regular  lower body clothing?: A Lot 6 Click Score: 16   End of Session Equipment Utilized During Treatment: Gait belt;Rolling walker;Other (comment)(BSC)  Activity Tolerance: Patient limited by pain;Patient limited by fatigue Patient left: in bed;with call bell/phone within reach;with bed alarm set  OT Visit Diagnosis: Unsteadiness on feet (R26.81);Other abnormalities of gait and mobility (R26.89);Muscle weakness (generalized) (M62.81);History of falling (Z91.81);Pain Pain - Right/Left: Left Pain - part of body: Hip;Leg                Time: 3875-6433 OT Time Calculation (min): 32 min Charges:  OT General Charges $OT Visit: 1 Visit OT Evaluation $OT Eval Moderate Complexity: 1 Mod    Britt Bottom 02/24/2019, 3:56 PM

## 2019-02-24 NOTE — Progress Notes (Signed)
ANTICOAGULATION CONSULT NOTE - Follow Up Consult  Pharmacy Consult for Warfarin Indication: atrial fibrillation and mechanical MVR (1996)  No Known Allergies  Patient Measurements: Height: 5\' 6"  (167.6 cm) Weight: 172 lb (78 kg) IBW/kg (Calculated) : 59.3  Vital Signs: Temp: 97.7 F (36.5 C) (08/10 1435) Temp Source: Oral (08/10 1435) BP: 122/80 (08/10 1435) Pulse Rate: 65 (08/10 1435)  Labs: Recent Labs    02/22/19 0200 02/23/19 0604 02/24/19 0422  HGB 11.7* 11.2* 10.6*  HCT 40.0 38.9 35.5*  PLT 192 144* 136*  LABPROT 27.2* 26.0* 23.7*  INR 2.6* 2.4* 2.2*  CREATININE 0.78 0.68 0.75    Estimated Creatinine Clearance: 52.2 mL/min (by C-G formula based on SCr of 0.75 mg/dL).  Assessment:   85 yr old female admitted 8/8 with femoral neck fracture. On Warfarin PTA for hx mechanical MVR and afib.   Warfarin held 8/8 for surgery on 8/9, resumed on 8/9 pm.      INR is therapeutic today (2.2) but trending down, likely due to dose held 8/8.      PTA Warfarin regimen:  2.5 mg MWF, 5 mg TTSS.  Goal of Therapy:  INR 2-3 Monitor platelets by anticoagulation protocol: Yes   Plan:   Warfarin 5 mg again today, to try to avoid further decrease in INR.  Daily PT/INR.   Arty Baumgartner, [place] Pager: [phone] or phone: [phone] 02/24/2019,3:23 PM

## 2019-02-24 NOTE — Progress Notes (Addendum)
Pt was sitting in the chair this afternoon, mod assist with transfers going back to bed.  1830 Pt cannot urinate yet after the foley was removed since 5hrs ago. Bladder scanned for 365ml, bladder is soft, does not want in and out yet. She wants to try voiding later. Dr Avon Gully notified. Cont to monitor for urine output.

## 2019-02-24 NOTE — Progress Notes (Signed)
Orthopaedic Trauma Progress Note  S: Patient doing well this morning, minimal pain. No questions or concerns, is very grateful and is pleased with care she has received so far.  O:  Vitals:   02/24/19 0031 02/24/19 0407  BP: (!) 132/56 (!) 146/55  Pulse: 63 64  Resp: 16 16  Temp: 98.8 F (37.1 C) 98.2 F (36.8 C)  SpO2: 93% 96%    General - Sitting up in bed, NAD. Pleasant and cooperative Left Lower Extremity - Dressing is clean, dry, intact. No tenderness with palpation of hip, thigh, knee. Dorsiflexion/plantarflexion intact. Able to wiggle toes. Compartments soft and compressible. Sensation intact distally to light touch. 2+ DP pulse  Imaging: Stable post op imaging.   Labs:  Results for orders placed or performed during the hospital encounter of 02/22/19 (from the past 24 hour(s))  Protime-INR     Status: Abnormal   Collection Time: 02/24/19  4:22 AM  Result Value Ref Range   Prothrombin Time 23.7 (H) 11.4 - 15.2 seconds   INR 2.2 (H) 0.8 - 1.2  Basic metabolic panel     Status: Abnormal   Collection Time: 02/24/19  4:22 AM  Result Value Ref Range   Sodium 134 (L) 135 - 145 mmol/L   Potassium 5.2 (H) 3.5 - 5.1 mmol/L   Chloride 98 98 - 111 mmol/L   CO2 29 22 - 32 mmol/L   Glucose, Bld 125 (H) 70 - 99 mg/dL   BUN 20 8 - 23 mg/dL   Creatinine, Ser 0.75 0.44 - 1.00 mg/dL   Calcium 9.4 8.9 - 10.3 mg/dL   GFR calc non Af Amer >60 >60 mL/min   GFR calc Af Amer >60 >60 mL/min   Anion gap 7 5 - 15  CBC     Status: Abnormal   Collection Time: 02/24/19  4:22 AM  Result Value Ref Range   WBC 8.7 4.0 - 10.5 K/uL   RBC 3.73 (L) 3.87 - 5.11 MIL/uL   Hemoglobin 10.6 (L) 12.0 - 15.0 g/dL   HCT 35.5 (L) 36.0 - 46.0 %   MCV 95.2 80.0 - 100.0 fL   MCH 28.4 26.0 - 34.0 pg   MCHC 29.9 (L) 30.0 - 36.0 g/dL   RDW 15.1 11.5 - 15.5 %   Platelets 136 (L) 150 - 400 K/uL   nRBC 0.0 0.0 - 0.2 %    Assessment: 85 year old female s/p fall  Injuries: Left femoral neck fracture s/p  percutaneous fixation  Weightbearing: WBAT LLE  Insicional and dressing care: Remove tomorrow and leave open to air   Orthopedic device(s): None  CV/Blood loss: Acute blood loss anemia, Hgb 10.6. Hemodynamically stable  Pain management:  1. Tylenol 650 mg q 6 hours scheduled 2. Robaxin 500 mg q 6 hours PRN 3. Hydrocodone 5/325 mg q 6 hours PRN 4. Neurontin 100 mg TID 5. Morphine 2 mg q 2 hours PRN  VTE prophylaxis: Coumadin. Recheck INR today  ID: Ancef 2gm post op  Foley/Lines: Foley in place, remove once up with therapy. KVO IVFs  Medical co-morbidities: mechanical mitral valve, hypertension and, A. fib on Coumadin   Dispo: PT/OT eval today, dispo pending. May need SNF.   Follow - up plan: 2 weeks for repeat x-rays    Roberts Bon A. [facility] ?[phone]? (phone)

## 2019-02-24 NOTE — Progress Notes (Signed)
Progress Note   Tina Wade LOV:[id number] DOB: 1928-09-22 DOA: 02/22/2019  PCP: Bernerd Limbo, MD  Patient coming from: Home  Chief Complaint: Fall.  HPI: Tina Wade is a 85 y.o. female with history of mechanical mitral valve, hypertension and, A. fib was walking at home when she slipped and fell did not hit her head or lose consciousness.  Hurt her left side and was brought to the ER. In the ER x-ray showed left hip fracture on-call orthopedic surgeon Dr. Doreatha Martin was consulted.  CT head C-spine were negative.  COVID-19 test was negative.  EKG shows A. fib rest of the labs show hemoglobin of 11.7 platelets 192 creatinine 1.7 INR 2.   Subjective: No acute issues/events since admission. Pain well controlled. Denies shortness of breath, chest pain, headache, fevers, chills. Patient does report ongoing cough but states this is somewhat chronic (>4 months in duration).  Assessment/Plan Principal Problem:   Closed left hip fracture, initial encounter [facility]) Active Problems:   H/O mitral valve replacement   Chronic atrial fibrillation   Essential hypertension  Closed left hip fracture status post mechanical fall  -S/P percutaneous fixation with cannulated screws 02/23/19 -INR 2.2 - ok to resume coumadin per Ortho -Pain well controlled -PT/OT to evaluate later per ortho recs - ? Disposition although likely SNF given age and comorbid conditions. -Remove foley, DC tele, stop IV fluids given improved PO intake  Acute hypoxia, likely 2/2 sedation/narcotics, improving -weaned off O2 at rest this morning - sats 90-94% -Will need ambulatory O2 screen once able to ambulate with PT/OT  Hyperkalemia, mild -Follow with am labs  Mechanical mitral valve on Coumadin  -Resume coumadin; INR trending dialy  Chronic atrial fibrillation rate controlled.   -Coumadin resumed as above -Patient not on any rate control per home med list - continue to follow  History of TIA  -Continue on statins  -coumadin is on hold - also on 81mg  asa at home  COPD, not in acute exacerbation -not actively wheezing on inhalers.  Hypertension, essential, well controlled -Continue on Cozaar.  Hypothyroidism  -Continue Synthroid.   DVT prophylaxis: SCDs in anticipation of surgery. Code Status: DNR confirmed with patient. Disposition Plan: Pending clinical status - may benefit from short rehab stay - defer to PT/OT  Consults called: Orthopedics. Admission status: Inpatient, postop recovery - requiring pain management and PT/OT evaluation to ensure safe disposition   Prior to Admission medications   Medication Sig Start Date End Date Taking? Authorizing Provider  aspirin 81 MG tablet Take 81 mg by mouth daily.      [provider]  calcium carbonate (OS-CAL) 600 MG TABS tablet Take 600 mg by mouth 2 (two) times daily with a meal.    [provider]  furosemide (LASIX) 40 MG tablet Take 40 mg by mouth daily.     [provider]  gabapentin (NEURONTIN) 100 MG capsule Take 1 capsule (100 mg total) by mouth at bedtime and may repeat dose one time if needed. 11/04/18 11/04/19  Garvin Fila, MD  levothyroxine (SYNTHROID, LEVOTHROID) 100 MCG tablet Take 100 mcg by mouth daily.     [provider]  losartan (COZAAR) 25 MG tablet Take 1 tablet (25 mg total) by mouth daily. 03/27/14   Leonie Man, MD  montelukast (SINGULAIR) 10 MG tablet Take 10 mg by mouth at bedtime.    [provider]  rosuvastatin (CRESTOR) 10 MG tablet Take 10 mg by mouth daily. 02/14/18   [provider]  Tiotropium Bromide-Olodaterol (STIOLTO RESPIMAT) 2.5-2.5 MCG/ACT AERS USE 2 INHALATIONS DAILY 06/17/18   [provider]  warfarin (COUMADIN) 5 MG tablet TAKE ONE TO ONE AND ONE-HALF TABLETS DAILY OR AS DIRECTED Patient taking differently: Take 5-7.5 mg by mouth daily.     Rockne Menghini, RPH-CPP    Physical Exam: Constitutional: Moderately built and nourished.  Vitals:   02/23/19 2028 02/24/19 0031 02/24/19 0407 02/24/19 0753  BP:  (!) 132/56 (!) 146/55 (!) 112/92  Pulse:  63 64 64  Resp:  16 16 16   Temp:  98.8 F (37.1 C) 98.2 F (36.8 C) 98.2 F (36.8 C)  TempSrc:  Oral Oral Oral  SpO2: 93% 93% 96% 94%  Weight:      Height:       General:  Pleasantly resting in bed, No acute distress. HEENT:  Normocephalic atraumatic.  Sclerae nonicteric, noninjected.  Extraocular movements intact bilaterally. Neck:  Without mass or deformity.  Trachea is midline. Lungs:  Clear to auscultate bilaterally without rhonchi, wheeze, or rales. Heart:  Regular rate and rhythm.  Without murmurs, rubs, or gallops. Abdomen:  Soft, nontender, nondistended.  Without guarding or rebound. Extremities: Without cyanosis, clubbing, edema, or obvious deformity. Vascular:  Dorsalis pedis and posterior tibial pulses palpable bilaterally. Skin: Bandage clean dry and intact. Warm and dry, no erythema, no ulcerations.    Labs on Admission: I have personally reviewed following labs and imaging studies  CBC: Recent Labs  Lab 02/22/19 0200 02/23/19 0604 02/24/19 0422  WBC 9.9 7.9 8.7  NEUTROABS 8.0*  --   --   HGB 11.7* 11.2* 10.6*  HCT 40.0 38.9 35.5*  MCV 94.8 97.0 95.2  PLT 192 144* 024*   Basic Metabolic Panel: Recent Labs  Lab 02/22/19 0200 02/23/19 0604 02/24/19 0422  NA 137 136 134*  K 4.0 4.8 5.2*  CL 101 98 98  CO2 26 27 29   GLUCOSE 152* 109* 125*  BUN 19 16 20   CREATININE 0.78 0.68 0.75  CALCIUM 9.1 9.3 9.4   GFR: Estimated Creatinine Clearance: 52.2 mL/min (by C-G formula based on SCr of 0.75 mg/dL). Liver Function Tests: No results for input(s): AST, ALT, ALKPHOS, BILITOT, PROT, ALBUMIN in the last 168 hours. No results for input(s): LIPASE, AMYLASE in the last 168 hours. No results for input(s): AMMONIA in the last 168 hours. Coagulation Profile: Recent Labs  Lab 02/22/19 0200 02/23/19 0604 02/24/19 0422  INR 2.6* 2.4* 2.2*    Urine analysis:    Component Value Date/Time   COLORURINE YELLOW 09/06/2010 0057   APPEARANCEUR CLOUDY (A) 09/06/2010 0057   LABSPEC 1.020 09/06/2010 0057   PHURINE 7.0 09/06/2010 0057   GLUCOSEU NEGATIVE 11/06/2009 1300   HGBUR NEGATIVE 09/06/2010 0057   BILIRUBINUR NEGATIVE 09/06/2010 0057   KETONESUR NEGATIVE 09/06/2010 0057   PROTEINUR NEGATIVE 09/06/2010 0057   UROBILINOGEN 0.2 09/06/2010 0057   NITRITE NEGATIVE 09/06/2010 0057   LEUKOCYTESUR NEGATIVE 09/06/2010 0057    Recent Results (from the past 240 hour(s))  SARS Coronavirus 2 [facility] order, Performed in [facility] hospital lab) Nasopharyngeal Nasopharyngeal Swab     Status: None   Collection Time: 02/22/19  2:26 AM   Specimen: Nasopharyngeal Swab  Result Value Ref Range Status   SARS Coronavirus 2 NEGATIVE NEGATIVE Final    Comment: (NOTE) If result is NEGATIVE SARS-CoV-2 target nucleic acids are NOT DETECTED. The SARS-CoV-2 RNA is generally detectable in upper and lower  respiratory specimens during the acute phase of infection.  The lowest  concentration of SARS-CoV-2 viral copies this assay can detect is 250  copies / mL. A negative result does not preclude SARS-CoV-2 infection  and should not be used as the sole basis for treatment or other  patient management decisions.  A negative result may occur with  improper specimen collection / handling, submission of specimen other  than nasopharyngeal swab, presence of viral mutation(s) within the  areas targeted by this assay, and inadequate number of viral copies  (<250 copies / mL). A negative result must be combined with clinical  observations, patient history, and epidemiological information. If result is POSITIVE SARS-CoV-2 target nucleic acids are DETECTED. The SARS-CoV-2 RNA is generally detectable in upper and lower  respiratory specimens dur ing the acute phase of infection.  Positive  results are indicative of active infection with SARS-CoV-2.  Clinical   correlation with patient history and other diagnostic information is  necessary to determine patient infection status.  Positive results do  not rule out bacterial infection or co-infection with other viruses. If result is PRESUMPTIVE POSTIVE SARS-CoV-2 nucleic acids MAY BE PRESENT.   A presumptive positive result was obtained on the submitted specimen  and confirmed on repeat testing.  While 2019 novel coronavirus  (SARS-CoV-2) nucleic acids may be present in the submitted sample  additional confirmatory testing may be necessary for epidemiological  and / or clinical management purposes  to differentiate between  SARS-CoV-2 and other Sarbecovirus currently known to infect humans.  If clinically indicated additional testing with an alternate test  methodology [phone]) is advised. The SARS-CoV-2 RNA is generally  detectable in upper and lower respiratory sp ecimens during the acute  phase of infection. The expected result is Negative. Fact Sheet for Patients:  StrictlyIdeas.no Fact Sheet for Healthcare Providers: BankingDealers.co.za This test is not yet approved or cleared by the [place] FDA and has been authorized for detection and/or diagnosis of SARS-CoV-2 by FDA under an Emergency Use Authorization (EUA).  This EUA will remain in effect (meaning this test can be used) for the duration of the COVID-19 declaration under Section 564(b)(1) of the Act, 21 U.S.C. section 360bbb-3(b)(1), unless the authorization is terminated or revoked sooner. Performed at [facility], [place] [address], [place], [place] [address]   Surgical pcr screen     Status: None   Collection Time: 02/23/19  2:40 AM   Specimen: Nasal Mucosa; Nasal Swab  Result Value Ref Range Status   MRSA, PCR NEGATIVE NEGATIVE Final   Staphylococcus aureus NEGATIVE NEGATIVE Final    Comment: (NOTE) The Xpert SA Assay (FDA approved for NASAL specimens in patients 27  years of age and older), is one component of a comprehensive surveillance program. It is not intended to diagnose infection nor to guide or monitor treatment. Performed at [facility], [place] [address], [place], [place] [address]      Radiological Exams on Admission: Dg C-arm 1-60 Min  Result Date: 02/23/2019 CLINICAL DATA:  Left hip pinning. EXAM: DG C-ARM 61-120 MIN; DG HIP (WITH OR WITHOUT PELVIS) 2-3V LEFT COMPARISON:  CT of the left hip February 22, 2019 FINDINGS: Intraoperative fluoroscopic images from left hip pinning demonstrate 3 cancellous screws placement across sub capitellar left femoral neck fracture. Osseous alignment is near anatomic. No new fractures. Fluoroscopy time is recorded as 1 minutes 27 seconds. IMPRESSION: Intraoperative fluoroscopic images from left hip pinning. Electronically Signed   By: Fidela Salisbury M.D.   On: 02/23/2019 15:51   Dg  Hip Operative Unilat With Pelvis Left  Result Date: 02/23/2019 CLINICAL DATA:  Left hip pinning. FLUOROSCOPY TIME:  1 minutes 27 seconds. Images: 5 EXAM: OPERATIVE LEFT HIP (WITH PELVIS IF PERFORMED) 5 VIEWS TECHNIQUE: Fluoroscopic spot image(s) were submitted for interpretation post-operatively. COMPARISON:  None. FINDINGS: Three screws have been placed across the subcapital fracture in the left hip. IMPRESSION: Three screws have been placed across the subcapital fracture in the left hip. Electronically Signed   By: Dorise Bullion III M.D   On: 02/23/2019 15:50   Dg Hip Unilat W Or W/o Pelvis 2-3 Views Left  Result Date: 02/23/2019 CLINICAL DATA:  Left hip pinning. EXAM: DG C-ARM 61-120 MIN; DG HIP (WITH OR WITHOUT PELVIS) 2-3V LEFT COMPARISON:  CT of the left hip February 22, 2019 FINDINGS: Intraoperative fluoroscopic images from left hip pinning demonstrate 3 cancellous screws placement across sub capitellar left femoral neck fracture. Osseous alignment is near anatomic. No new fractures. Fluoroscopy time is recorded as 1 minutes  27 seconds. IMPRESSION: Intraoperative fluoroscopic images from left hip pinning. Electronically Signed   By: Fidela Salisbury M.D.   On: 02/23/2019 15:51    EKG: Independently reviewed.  A. fib rate controlled.  Little Ishikawa DO [facility] Pager [phone].  If 7PM-7AM, please contact night-coverage [email or web address] Password [facility]  02/24/2019, 7:54 AM

## 2019-02-24 NOTE — Evaluation (Signed)
Physical Therapy Evaluation Patient Details Name: Tina Wade MRN: [id number] DOB: 05-12-1929 Today's Date: 02/24/2019   History of Present Illness  Patient is an 35 year olf female admitted after fall at home. Sustained a left hip fracture and is s/p pinning. PMH to includeL CVA, TIA, hypertension, hyperlipidemia, Afib.  Clinical Impression  Patient received in bed, reports no pain while sitting in bed. Patient requires mod assist for performing supine to sit. Mod assist to scoot forward and to raise trunk to seated position. Min assist with bed height elevated to perform sit to stand. Patient is able to ambulate 3 feet from bed to recliner with min guard, RW and max cues for safety with mobility. Patient will benefit from continued skilled care acutely to improve functional mobility and independence.     Follow Up Recommendations Home health PT    Equipment Recommendations  3in1 (PT)    Recommendations for Other Services       Precautions / Restrictions Precautions Precautions: Fall Restrictions Weight Bearing Restrictions: No LLE Weight Bearing: Weight bearing as tolerated      Mobility  Bed Mobility Overal bed mobility: Needs Assistance Bed Mobility: Supine to Sit     Supine to sit: Mod assist     General bed mobility comments: required mod assist for scooting forward, raising trunk from bed, HOB elevated  Transfers Overall transfer level: Needs assistance Equipment used: Rolling walker (2 wheeled) Transfers: Sit to/from Stand Sit to Stand: From elevated surface;Mod assist         General transfer comment: requires increased time  Ambulation/Gait Ambulation/Gait assistance: Min assist Gait Distance (Feet): 3 Feet Assistive device: Rolling walker (2 wheeled) Gait Pattern/deviations: Step-to pattern;Decreased step length - right;Decreased step length - left     General Gait Details: once standing patient is able to weight shift well and take a few steps  from bed to recliner, cues needed for safety with transfer and gait  Stairs            Wheelchair Mobility    Modified Rankin (Stroke Patients Only)       Balance Overall balance assessment: Needs assistance Sitting-balance support: Bilateral upper extremity supported;Feet supported Sitting balance-Leahy Scale: Fair     Standing balance support: Bilateral upper extremity supported Standing balance-Leahy Scale: Fair                               Pertinent Vitals/Pain Pain Assessment: Faces Faces Pain Scale: Hurts even more Pain Descriptors / Indicators: Aching;Discomfort;Operative site guarding;Sore;Grimacing;Guarding Pain Intervention(s): Monitored during session;Limited activity within patient's tolerance;Repositioned    Home Living Family/patient expects to be discharged to:: Private residence Living Arrangements: Children Available Help at Discharge: Family;Available 24 hours/day Type of Home: House Home Access: Level entry     Home Layout: One level Home Equipment: Walker - 4 wheels;Grab bars - tub/shower      Prior Function Level of Independence: Independent with assistive device(s)         Comments: ambulates with rollator when needed, lives with daughter who is currently there working from home     Hand Dominance        Extremity/Trunk Assessment             Cervical / Trunk Assessment Cervical / Trunk Assessment: Normal  Communication   Communication: No difficulties  Cognition Arousal/Alertness: Awake/alert Behavior During Therapy: WFL for tasks assessed/performed Overall Cognitive Status: Within Functional Limits for tasks assessed  General Comments      Exercises Total Joint Exercises Ankle Circles/Pumps: AROM;20 reps;Both   Assessment/Plan    PT Assessment Patient needs continued PT services  PT Problem List Decreased strength;Decreased mobility;Decreased  safety awareness;Decreased activity tolerance;Decreased balance;Decreased knowledge of use of DME;Pain       PT Treatment Interventions DME instruction;Therapeutic activities;Gait training;Therapeutic exercise;Stair training;Functional mobility training;Balance training;Patient/family education    PT Goals (Current goals can be found in the Care Plan section)  Acute Rehab PT Goals Patient Stated Goal: to return home with daughter PT Goal Formulation: With patient Time For Goal Achievement: 03/03/19 Potential to Achieve Goals: Good    Frequency Min 5X/week   Barriers to discharge        Co-evaluation               AM-PAC PT "6 Clicks" Mobility  Outcome Measure Help needed turning from your back to your side while in a flat bed without using bedrails?: A Lot Help needed moving from lying on your back to sitting on the side of a flat bed without using bedrails?: A Lot Help needed moving to and from a bed to a chair (including a wheelchair)?: A Lot Help needed standing up from a chair using your arms (e.g., wheelchair or bedside chair)?: A Lot Help needed to walk in hospital room?: A Little Help needed climbing 3-5 steps with a railing? : A Lot 6 Click Score: 13    End of Session Equipment Utilized During Treatment: Gait belt Activity Tolerance: Patient limited by fatigue;Patient limited by pain Patient left: in chair;with chair alarm set;with call bell/phone within reach Nurse Communication: Mobility status PT Visit Diagnosis: Unsteadiness on feet (R26.81);Muscle weakness (generalized) (M62.81);Difficulty in walking, not elsewhere classified (R26.2);Pain;History of falling (Z91.81) Pain - Right/Left: Left Pain - part of body: Hip    Time: 1005-1029 PT Time Calculation (min) (ACUTE ONLY): 24 min   Charges:   PT Evaluation $PT Eval Moderate Complexity: 1 Mod PT Treatments $Therapeutic Activity: 8-22 mins        Annmarie Plemmons, PT, GCS 02/24/19,10:50 AM

## 2019-02-25 ENCOUNTER — Encounter (HOSPITAL_COMMUNITY): Payer: Self-pay | Admitting: Student

## 2019-02-25 LAB — CBC
HCT: 35.3 % — ABNORMAL LOW (ref 36.0–46.0)
Hemoglobin: 10.4 g/dL — ABNORMAL LOW (ref 12.0–15.0)
MCH: 28.7 pg (ref 26.0–34.0)
MCHC: 29.5 g/dL — ABNORMAL LOW (ref 30.0–36.0)
MCV: 97.2 fL (ref 80.0–100.0)
Platelets: 123 10*3/uL — ABNORMAL LOW (ref 150–400)
RBC: 3.63 MIL/uL — ABNORMAL LOW (ref 3.87–5.11)
RDW: 15.2 % (ref 11.5–15.5)
WBC: 7.4 10*3/uL (ref 4.0–10.5)
nRBC: 0 % (ref 0.0–0.2)

## 2019-02-25 LAB — BASIC METABOLIC PANEL
Anion gap: 8 (ref 5–15)
BUN: 25 mg/dL — ABNORMAL HIGH (ref 8–23)
CO2: 29 mmol/L (ref 22–32)
Calcium: 9.1 mg/dL (ref 8.9–10.3)
Chloride: 99 mmol/L (ref 98–111)
Creatinine, Ser: 0.74 mg/dL (ref 0.44–1.00)
GFR calc Af Amer: >60 mL/min (ref >60)
GFR calc non Af Amer: >60 mL/min (ref >60)
Glucose, Bld: 100 mg/dL — ABNORMAL HIGH (ref 70–99)
Potassium: 5.4 mmol/L — ABNORMAL HIGH (ref 3.5–5.1)
Sodium: 136 mmol/L (ref 135–145)

## 2019-02-25 LAB — PROTIME-INR
INR: 2.6 — ABNORMAL HIGH (ref 0.8–1.2)
Prothrombin Time: 27.5 s — ABNORMAL HIGH (ref 11.4–15.2)

## 2019-02-25 MED ORDER — WARFARIN SODIUM 2.5 MG PO TABS
2.5000 mg | ORAL_TABLET | Freq: Once | ORAL | Status: AC
  Administered 2019-02-25: 2.5 mg via ORAL
  Filled 2019-02-25: qty 1

## 2019-02-25 MED ORDER — PSEUDOEPHEDRINE HCL ER 120 MG PO TB12
120.0000 mg | ORAL_TABLET | Freq: Two times a day (BID) | ORAL | Status: DC
  Administered 2019-02-25 – 2019-02-27 (×5): 120 mg via ORAL
  Filled 2019-02-25 (×6): qty 1

## 2019-02-25 MED ORDER — ACETAMINOPHEN 325 MG PO TABS
650.0000 mg | ORAL_TABLET | Freq: Four times a day (QID) | ORAL | Status: DC
  Administered 2019-02-25 (×2): 650 mg via ORAL
  Administered 2019-02-25: 325 mg via ORAL
  Administered 2019-02-26 – 2019-02-27 (×6): 650 mg via ORAL
  Filled 2019-02-25 (×9): qty 2

## 2019-02-25 MED ORDER — ADULT MULTIVITAMIN W/MINERALS CH: 1.0000 | ORAL_TABLET | Freq: Every day | ORAL | Status: DC

## 2019-02-25 MED ORDER — FERROUS SULFATE 325 (65 FE) MG PO TABS
325.0000 mg | ORAL_TABLET | Freq: Every day | ORAL | Status: DC
  Administered 2019-02-25 – 2019-02-27 (×3): 325 mg via ORAL
  Filled 2019-02-25 (×3): qty 1

## 2019-02-25 MED ORDER — FUROSEMIDE 40 MG PO TABS
40.0000 mg | ORAL_TABLET | Freq: Every day | ORAL | Status: DC
  Administered 2019-02-25 – 2019-02-27 (×3): 40 mg via ORAL
  Filled 2019-02-25 (×3): qty 1

## 2019-02-25 MED ORDER — ALBUTEROL SULFATE (2.5 MG/3ML) 0.083% IN NEBU
3.0000 mL | INHALATION_SOLUTION | RESPIRATORY_TRACT | Status: DC | PRN
  Administered 2019-02-25: 3 mL via RESPIRATORY_TRACT
  Filled 2019-02-25: qty 3

## 2019-02-25 MED ORDER — ACETAMINOPHEN 500 MG PO TABS: 500.0000 mg | ORAL_TABLET | Freq: Four times a day (QID) | ORAL | Status: DC

## 2019-02-25 NOTE — Progress Notes (Signed)
Occupational Therapy Treatment Patient Details Name: Tina Wade MRN: [id number] DOB: 1929/01/12 Today's Date: 02/25/2019    History of present illness Patient is an 85 year old female admitted after fall at home. Sustained a left hip fracture and is s/p pinning. PMH to includeL CVA, TIA, hypertension, hyperlipidemia, Afib.   OT comments  Pt making progress with functional goals. Pt required min A for bed mobility with L LE to EOB using rails and increased time to completed. Pt sat EOB for simulated bathing tasks set up/sup with UB and mod A with LB. Pt required mod A for sit to stand from EOB to RW to transfer to [facility] min A. Pt required mod A with toileting tasks. Pt stood from [facility] to [place] with min A and ambulated x 3 ft to recliner.   Follow Up Recommendations  Home health OT    Equipment Recommendations  3 in 1 bedside commode;Other (comment)(reacher)    Recommendations for Other Services      Precautions / Restrictions Precautions Precautions: Fall Restrictions Weight Bearing Restrictions: Yes LLE Weight Bearing: Weight bearing as tolerated       Mobility Bed Mobility Overal bed mobility: Needs Assistance Bed Mobility: Supine to Sit     Supine to sit: Min assist;HOB elevated     General bed mobility comments: min A with L LE to EOB and heavy use of rails with HOB raised  Transfers Overall transfer level: Needs assistance Equipment used: Rolling walker (2 wheeled) Transfers: Sit to/from Stand Sit to Stand: From elevated surface;Mod assist;Min assist         General transfer comment: mod A for initial sit - stand from EOB, min A for BSC    Balance Overall balance assessment: Needs assistance Sitting-balance support: Bilateral upper extremity supported;Feet supported Sitting balance-Leahy Scale: Fair     Standing balance support: Bilateral upper extremity supported;During functional activity Standing balance-Leahy Scale: Poor                             ADL either performed or assessed with clinical judgement   ADL Overall ADL's : Needs assistance/impaired     Grooming: Wash/dry hands;Wash/dry face;Sitting;Set up;Supervision/safety   Upper Body Bathing: Set up;Supervision/ safety;Sitting   Lower Body Bathing: Moderate assistance Lower Body Bathing Details (indicate cue type and reason): simulated Upper Body Dressing : Sitting;Min guard   Lower Body Dressing: Maximal assistance   Toilet Transfer: Moderate assistance;Minimal assistance;Ambulation;RW;BSC;Cueing for safety;Cueing for sequencing   Toileting- Clothing Manipulation and Hygiene: Moderate assistance;Sit to/from stand       Functional mobility during ADLs: Moderate assistance;Minimal assistance;Rolling walker;Cueing for safety;Cueing for sequencing       Vision Baseline Vision/History: Wears glasses Wears Glasses: At all times Patient Visual Report: No change from baseline     Perception     Praxis      Cognition Arousal/Alertness: Awake/alert Behavior During Therapy: WFL for tasks assessed/performed Overall Cognitive Status: Within Functional Limits for tasks assessed                                          Exercises     Shoulder Instructions       General Comments      Pertinent Vitals/ Pain       Pain Assessment: 0-10 Pain Score: 5  Pain Location: L hip Pain Descriptors /  Indicators: Aching;Discomfort;Sore;Grimacing;Guarding;Operative site guarding Pain Intervention(s): Monitored during session;Premedicated before session;Repositioned  Home Living                                          Prior Functioning/Environment              Frequency  Min 2X/week        Progress Toward Goals  OT Goals(current goals can now be found in the care plan section)  Progress towards OT goals: Progressing toward goals  Acute Rehab OT Goals Patient Stated Goal: to return home with daughter  Plan  Discharge plan remains appropriate    Co-evaluation                 AM-PAC OT "6 Clicks" Daily Activity     Outcome Measure   Help from another person eating meals?: None Help from another person taking care of personal grooming?: A Little Help from another person toileting, which includes using toliet, bedpan, or urinal?: A Lot Help from another person bathing (including washing, rinsing, drying)?: A Lot Help from another person to put on and taking off regular upper body clothing?: A Little Help from another person to put on and taking off regular lower body clothing?: A Lot 6 Click Score: 16    End of Session Equipment Utilized During Treatment: Gait belt;Rolling walker;Other (comment)(BSC)  OT Visit Diagnosis: Unsteadiness on feet (R26.81);Other abnormalities of gait and mobility (R26.89);Muscle weakness (generalized) (M62.81);History of falling (Z91.81);Pain Pain - Right/Left: Left Pain - part of body: Hip;Leg   Activity Tolerance Patient tolerated treatment well   Patient Left in chair;with call bell/phone within reach   Nurse Communication          Time: 3818-2993 OT Time Calculation (min): 33 min  Charges: OT General Charges $OT Visit: 1 Visit OT Treatments $Self Care/Home Management : 8-22 mins $Therapeutic Activity: 8-22 mins     Britt Bottom 02/25/2019, 2:18 PM

## 2019-02-25 NOTE — Plan of Care (Signed)
  Problem: Elimination: Goal: Will not experience complications related to bowel motility Outcome: Progressing   Problem: Activity: Goal: Risk for activity intolerance will decrease Outcome: Progressing   Problem: Safety: Goal: Ability to remain free from injury will improve Outcome: Progressing   Problem: Pain Managment: Goal: General experience of comfort will improve Outcome: Progressing

## 2019-02-25 NOTE — Progress Notes (Addendum)
Progress Note   Tina Wade UYQ:[id number] DOB: 1928/11/05 DOA: 02/22/2019  PCP: Bernerd Limbo, MD  Patient coming from: Home  Chief Complaint: Fall.  HPI: Tina Wade is a 85 y.o. female with history of mechanical mitral valve, hypertension and, A. fib was walking at home when she slipped and fell did not hit her head or lose consciousness.  Hurt her left side and was brought to the ER. In the ER x-ray showed left hip fracture on-call orthopedic surgeon Dr. Doreatha Martin was consulted.  CT head C-spine were negative.  COVID-19 test was negative.  EKG shows A. fib rest of the labs show hemoglobin of 11.7 platelets 192 creatinine 1.7 INR 2.   Subjective: No acute issues/events since admission. Pain well controlled. Denies shortness of breath, chest pain, headache, fevers, chills. Patient does report ongoing cough but states this is somewhat chronic (>4 months in duration).  Assessment/Plan Principal Problem:   Closed left hip fracture, initial encounter [facility]) Active Problems:   H/O mitral valve replacement   Chronic atrial fibrillation   Essential hypertension  Closed left hip fracture status post mechanical fall  -S/P percutaneous fixation with cannulated screws 02/23/19 -INR 2.6 now - coumadin ongoing per pharmacy dosing -Pain well controlled -PT/OT to continue evaluation; possibly home with HH/PT in the next 24-48h  Acute hypoxia, likely 2/2 sedation/narcotics, resolving -placed back on O2 late yesterday but weaned off again this morning with sats mid 90s without supplementation -Will need ambulatory O2 screen once able to ambulate with PT/OT  Transient obstructive uropathy s/p foley removal, resolved -After foley removal yesterday patient had voiding issues but appears to have resolved at this point -Repeat bladder scan PRN symptoms/fullness  Hyperkalemia, ongoing -Resume home diuretics - follow with am labs -Unclear etiology for diuretics - Echo 05/2018 EF 50-55% without  overt dysfunction (?mild decrease systolic dysfunction)  Mechanical mitral valve on Coumadin  -Resume coumadin; INR trending dialy  Chronic atrial fibrillation rate controlled.   -Coumadin resumed as above -Patient not on any rate control per home med list - continue to follow  History of TIA  -Continue on statins -coumadin is on hold - also on 81mg  asa at home  COPD, not in acute exacerbation -not actively wheezing on inhalers.  Hypertension, essential, well controlled -Continue on Cozaar.  Hypothyroidism  -Continue Synthroid.   DVT prophylaxis: SCDs in anticipation of surgery. Code Status: DNR confirmed with patient. Disposition Plan: Pending clinical status - likely to DC home with HH/PT in the next 24-48h pending improvement in ambulation/pain Consults called: Orthopedics. Admission status: Inpatient, postop recovery - requiring pain management and PT/OT evaluation to ensure safe disposition  Prior to Admission medications   Medication Sig Start Date End Date Taking? Authorizing Provider  aspirin 81 MG tablet Take 81 mg by mouth daily.      [provider]  calcium carbonate (OS-CAL) 600 MG TABS tablet Take 600 mg by mouth 2 (two) times daily with a meal.    [provider]  furosemide (LASIX) 40 MG tablet Take 40 mg by mouth daily.     [provider]  gabapentin (NEURONTIN) 100 MG capsule Take 1 capsule (100 mg total) by mouth at bedtime and may repeat dose one time if needed. 11/04/18 11/04/19  Garvin Fila, MD  levothyroxine (SYNTHROID, LEVOTHROID) 100 MCG tablet Take 100 mcg by mouth daily.     [provider]  losartan (COZAAR) 25 MG tablet Take 1 tablet (25 mg total) by mouth daily.  03/27/14   Leonie Man, MD  montelukast (SINGULAIR) 10 MG tablet Take 10 mg by mouth at bedtime.    [provider]  rosuvastatin (CRESTOR) 10 MG tablet Take 10 mg by mouth daily. 02/14/18   [provider]  Tiotropium  Bromide-Olodaterol (STIOLTO RESPIMAT) 2.5-2.5 MCG/ACT AERS USE 2 INHALATIONS DAILY 06/17/18   [provider]  warfarin (COUMADIN) 5 MG tablet TAKE ONE TO ONE AND ONE-HALF TABLETS DAILY OR AS DIRECTED Patient taking differently: Take 5-7.5 mg by mouth daily.     Rockne Menghini, RPH-CPP    Physical Exam: Constitutional: Moderately built and nourished. Vitals:   02/24/19 1500 02/24/19 2042 02/24/19 2051 02/25/19 0412  BP:   (!) 126/56 (!) 124/51  Pulse:   66 66  Resp:   12 12  Temp:   97.7 F (36.5 C) 97.8 F (36.6 C)  TempSrc:   Oral Oral  SpO2: 94% 96% 99% 97%  Weight:      Height:       General:  Pleasantly resting in bed, No acute distress. HEENT:  Normocephalic atraumatic.  Sclerae nonicteric, noninjected.  Extraocular movements intact bilaterally. Neck:  Without mass or deformity.  Trachea is midline. Lungs:  Clear to auscultate bilaterally without rhonchi, wheeze, or rales. Heart:  Regular rate and rhythm.  Without murmurs, rubs, or gallops. Abdomen:  Soft, nontender, nondistended.  Without guarding or rebound. Extremities: Without cyanosis, clubbing, edema, or obvious deformity. Vascular:  Dorsalis pedis and posterior tibial pulses palpable bilaterally. Skin: Bandage clean dry and intact. Warm and dry, no erythema, no ulcerations.    Labs on Admission: I have personally reviewed following labs and imaging studies  CBC: Recent Labs  Lab 02/22/19 0200 02/23/19 0604 02/24/19 0422 02/25/19 0330  WBC 9.9 7.9 8.7 7.4  NEUTROABS 8.0*  --   --   --   HGB 11.7* 11.2* 10.6* 10.4*  HCT 40.0 38.9 35.5* 35.3*  MCV 94.8 97.0 95.2 97.2  PLT 192 144* 136* 518*   Basic Metabolic Panel: Recent Labs  Lab 02/22/19 0200 02/23/19 0604 02/24/19 0422 02/25/19 0330  NA 137 136 134* 136  K 4.0 4.8 5.2* 5.4*  CL 101 98 98 99  CO2 26 27 29 29   GLUCOSE 152* 109* 125* 100*  BUN 19 16 20  25*  CREATININE 0.78 0.68 0.75 0.74  CALCIUM 9.1 9.3 9.4 9.1   GFR: Estimated  Creatinine Clearance: 52.2 mL/min (by C-G formula based on SCr of 0.74 mg/dL). Liver Function Tests: No results for input(s): AST, ALT, ALKPHOS, BILITOT, PROT, ALBUMIN in the last 168 hours. No results for input(s): LIPASE, AMYLASE in the last 168 hours. No results for input(s): AMMONIA in the last 168 hours. Coagulation Profile: Recent Labs  Lab 02/22/19 0200 02/23/19 0604 02/24/19 0422 02/25/19 0330  INR 2.6* 2.4* 2.2* 2.6*   Urine analysis:    Component Value Date/Time   COLORURINE YELLOW 09/06/2010 0057   APPEARANCEUR CLOUDY (A) 09/06/2010 0057   LABSPEC 1.020 09/06/2010 0057   PHURINE 7.0 09/06/2010 0057   GLUCOSEU NEGATIVE 11/06/2009 1300   HGBUR NEGATIVE 09/06/2010 0057   BILIRUBINUR NEGATIVE 09/06/2010 0057   KETONESUR NEGATIVE 09/06/2010 0057   PROTEINUR NEGATIVE 09/06/2010 0057   UROBILINOGEN 0.2 09/06/2010 0057   NITRITE NEGATIVE 09/06/2010 0057   LEUKOCYTESUR NEGATIVE 09/06/2010 0057    Recent Results (from the past 240 hour(s))  SARS Coronavirus 2 [facility] order, Performed in [facility] hospital lab) Nasopharyngeal Nasopharyngeal Swab     Status: None  Collection Time: 02/22/19  2:26 AM   Specimen: Nasopharyngeal Swab  Result Value Ref Range Status   SARS Coronavirus 2 NEGATIVE NEGATIVE Final    Comment: (NOTE) If result is NEGATIVE SARS-CoV-2 target nucleic acids are NOT DETECTED. The SARS-CoV-2 RNA is generally detectable in upper and lower  respiratory specimens during the acute phase of infection. The lowest  concentration of SARS-CoV-2 viral copies this assay can detect is 250  copies / mL. A negative result does not preclude SARS-CoV-2 infection  and should not be used as the sole basis for treatment or other  patient management decisions.  A negative result may occur with  improper specimen collection / handling, submission of specimen other  than nasopharyngeal swab, presence of viral mutation(s) within the  areas targeted by this assay, and  inadequate number of viral copies  (<250 copies / mL). A negative result must be combined with clinical  observations, patient history, and epidemiological information. If result is POSITIVE SARS-CoV-2 target nucleic acids are DETECTED. The SARS-CoV-2 RNA is generally detectable in upper and lower  respiratory specimens dur ing the acute phase of infection.  Positive  results are indicative of active infection with SARS-CoV-2.  Clinical  correlation with patient history and other diagnostic information is  necessary to determine patient infection status.  Positive results do  not rule out bacterial infection or co-infection with other viruses. If result is PRESUMPTIVE POSTIVE SARS-CoV-2 nucleic acids MAY BE PRESENT.   A presumptive positive result was obtained on the submitted specimen  and confirmed on repeat testing.  While 2019 novel coronavirus  (SARS-CoV-2) nucleic acids may be present in the submitted sample  additional confirmatory testing may be necessary for epidemiological  and / or clinical management purposes  to differentiate between  SARS-CoV-2 and other Sarbecovirus currently known to infect humans.  If clinically indicated additional testing with an alternate test  methodology [phone]) is advised. The SARS-CoV-2 RNA is generally  detectable in upper and lower respiratory sp ecimens during the acute  phase of infection. The expected result is Negative. Fact Sheet for Patients:  StrictlyIdeas.no Fact Sheet for Healthcare Providers: BankingDealers.co.za This test is not yet approved or cleared by the [place] FDA and has been authorized for detection and/or diagnosis of SARS-CoV-2 by FDA under an Emergency Use Authorization (EUA).  This EUA will remain in effect (meaning this test can be used) for the duration of the COVID-19 declaration under Section 564(b)(1) of the Act, 21 U.S.C. section 360bbb-3(b)(1), unless the  authorization is terminated or revoked sooner. Performed at [facility], [place] [address], [place], [place] [address]   Surgical pcr screen     Status: None   Collection Time: 02/23/19  2:40 AM   Specimen: Nasal Mucosa; Nasal Swab  Result Value Ref Range Status   MRSA, PCR NEGATIVE NEGATIVE Final   Staphylococcus aureus NEGATIVE NEGATIVE Final    Comment: (NOTE) The Xpert SA Assay (FDA approved for NASAL specimens in patients 52 years of age and older), is one component of a comprehensive surveillance program. It is not intended to diagnose infection nor to guide or monitor treatment. Performed at [facility], [place] [address] [place], [place] [address]      Radiological Exams on Admission: Dg C-arm 1-60 Min  Result Date: 02/23/2019 CLINICAL DATA:  Left hip pinning. EXAM: DG C-ARM 61-120 MIN; DG HIP (WITH OR WITHOUT PELVIS) 2-3V LEFT COMPARISON:  CT of the left hip February 22, 2019 FINDINGS: Intraoperative  fluoroscopic images from left hip pinning demonstrate 3 cancellous screws placement across sub capitellar left femoral neck fracture. Osseous alignment is near anatomic. No new fractures. Fluoroscopy time is recorded as 1 minutes 27 seconds. IMPRESSION: Intraoperative fluoroscopic images from left hip pinning. Electronically Signed   By: Fidela Salisbury M.D.   On: 02/23/2019 15:51   Dg Hip Operative Unilat With Pelvis Left  Result Date: 02/23/2019 CLINICAL DATA:  Left hip pinning. FLUOROSCOPY TIME:  1 minutes 27 seconds. Images: 5 EXAM: OPERATIVE LEFT HIP (WITH PELVIS IF PERFORMED) 5 VIEWS TECHNIQUE: Fluoroscopic spot image(s) were submitted for interpretation post-operatively. COMPARISON:  None. FINDINGS: Three screws have been placed across the subcapital fracture in the left hip. IMPRESSION: Three screws have been placed across the subcapital fracture in the left hip. Electronically Signed   By: Dorise Bullion III M.D   On: 02/23/2019 15:50   Dg Hip Unilat W Or W/o  Pelvis 2-3 Views Left  Result Date: 02/23/2019 CLINICAL DATA:  Left hip pinning. EXAM: DG C-ARM 61-120 MIN; DG HIP (WITH OR WITHOUT PELVIS) 2-3V LEFT COMPARISON:  CT of the left hip February 22, 2019 FINDINGS: Intraoperative fluoroscopic images from left hip pinning demonstrate 3 cancellous screws placement across sub capitellar left femoral neck fracture. Osseous alignment is near anatomic. No new fractures. Fluoroscopy time is recorded as 1 minutes 27 seconds. IMPRESSION: Intraoperative fluoroscopic images from left hip pinning. Electronically Signed   By: Fidela Salisbury M.D.   On: 02/23/2019 15:51    EKG: Independently reviewed.  A. fib rate controlled.  Little Ishikawa DO [facility] Pager [phone].  If 7PM-7AM, please contact night-coverage [email or web address] Password [facility]  02/25/2019, 7:59 AM

## 2019-02-25 NOTE — Discharge Instructions (Addendum)
Information on my medicine - Coumadin   (Warfarin)  Why was Coumadin prescribed for you? Coumadin was prescribed for you because you have a blood clot or a medical condition that can cause an increased risk of forming blood clots. Blood clots can cause serious health problems by blocking the flow of blood to the heart, lung, or brain. Coumadin can prevent harmful blood clots from forming. As a reminder your indication for Coumadin is:   Blood Clot Prevention After Heart Valve Surgery and stroke prevention because of atrial fibrillation  What test will check on my response to Coumadin? While on Coumadin (warfarin) you will need to have an INR test regularly to ensure that your dose is keeping you in the desired range. The INR (international normalized ratio) number is calculated from the result of the laboratory test called prothrombin time (PT).  If an INR APPOINTMENT HAS NOT ALREADY BEEN MADE FOR YOU please schedule an appointment to have this lab work done by your health care provider within 7 days. Your INR goal is usually a number between:  2 to 3 or your provider may give you a more narrow range like 2-2.5.  Ask your health care provider during an office visit what your goal INR is.  What  do you need to  know  About  COUMADIN? Take Coumadin (warfarin) exactly as prescribed by your healthcare provider about the same time each day.  DO NOT stop taking without talking to the doctor who prescribed the medication.  Stopping without other blood clot prevention medication to take the place of Coumadin may increase your risk of developing a new clot or stroke.  Get refills before you run out.  What do you do if you miss a dose? If you miss a dose, take it as soon as you remember on the same day then continue your regularly scheduled regimen the next day.  Do not take two doses of Coumadin at the same time.  Important Safety Information A possible side effect of Coumadin (Warfarin) is an increased  risk of bleeding. You should call your healthcare provider right away if you experience any of the following: ? Bleeding from an injury or your nose that does not stop. ? Unusual colored urine (red or dark brown) or unusual colored stools (red or black). ? Unusual bruising for unknown reasons. ? A serious fall or if you hit your head (even if there is no bleeding).  Some foods or medicines interact with Coumadin (warfarin) and might alter your response to warfarin. To help avoid this: ? Eat a balanced diet, maintaining a consistent amount of Vitamin K. ? Notify your provider about major diet changes you plan to make. ? Avoid alcohol or limit your intake to 1 drink for women and 2 drinks for men per day. (1 drink is 5 oz. wine, 12 oz. beer, or 1.5 oz. liquor.)  Make sure that ANY health care provider who prescribes medication for you knows that you are taking Coumadin (warfarin).  Also make sure the healthcare provider who is monitoring your Coumadin knows when you have started a new medication including herbals and non-prescription products.  Coumadin (Warfarin)  Major Drug Interactions  Increased Warfarin Effect Decreased Warfarin Effect  Alcohol (large quantities) Antibiotics (esp. Septra/Bactrim, Flagyl, Cipro) Amiodarone (Cordarone) Aspirin (ASA) Cimetidine (Tagamet) Megestrol (Megace) NSAIDs (ibuprofen, naproxen, etc.) Piroxicam (Feldene) Propafenone (Rythmol SR) Propranolol (Inderal) Isoniazid (INH) Posaconazole (Noxafil) Barbiturates (Phenobarbital) Carbamazepine (Tegretol) Chlordiazepoxide (Librium) Cholestyramine (Questran) Griseofulvin Oral Contraceptives Rifampin Sucralfate (Carafate)  Vitamin K   Coumadin (Warfarin) Major Herbal Interactions  Increased Warfarin Effect Decreased Warfarin Effect  Garlic Ginseng Ginkgo biloba Coenzyme Q10 Green tea St. Johns wort    Coumadin (Warfarin) FOOD Interactions  Eat a consistent number of servings per week of foods  HIGH in Vitamin K (1 serving =  cup)  Collards (cooked, or boiled & drained) Kale (cooked, or boiled & drained) Mustard greens (cooked, or boiled & drained) Parsley *serving size only =  cup Spinach (cooked, or boiled & drained) Swiss chard (cooked, or boiled & drained) Turnip greens (cooked, or boiled & drained)  Eat a consistent number of servings per week of foods MEDIUM-HIGH in Vitamin K (1 serving = 1 cup)  Asparagus (cooked, or boiled & drained) Broccoli (cooked, boiled & drained, or raw & chopped) Brussel sprouts (cooked, or boiled & drained) *serving size only =  cup Lettuce, raw (green leaf, endive, romaine) Spinach, raw Turnip greens, raw & chopped   These websites have more information on Coumadin (warfarin):  FailFactory.se; VeganReport.com.au;      [facility] Discharge Instructions   General Discharge Instructions  WEIGHT BEARING STATUS: Weightbearing as tolerated on left leg  RANGE OF MOTION/ACTIVITY: Unrestricted range of motion of knee and hip  Wound Care: Incisions can be left open to air if there is no drainage. Okay to shower if no drainage from incisions.  DVT/PE prophylaxis: Coumadin  Diet: as you were eating previously.  Can use over the counter stool softeners and bowel preparations, such as Miralax, to help with bowel movements.  Narcotics can be constipating.  Be sure to drink plenty of fluids  PAIN MEDICATION USE AND EXPECTATIONS  You have likely been given narcotic medications to help control your pain.  After a traumatic event that results in an fracture (broken bone) with or without surgery, it is ok to use narcotic pain medications to help control one's pain.  We understand that everyone responds to pain differently and each individual patient will be evaluated on a regular basis for the continued need for narcotic medications. Ideally, narcotic medication use should last no more than 6-8 weeks (coinciding  with fracture healing).   As a patient it is your responsibility as well to monitor narcotic medication use and report the amount and frequency you use these medications when you come to your office visit.   We would also advise that if you are using narcotic medications, you should take a dose prior to therapy to maximize you participation.  IF YOU ARE ON NARCOTIC MEDICATIONS IT IS NOT PERMISSIBLE TO OPERATE A MOTOR VEHICLE (MOTORCYCLE/CAR/TRUCK/MOPED) OR HEAVY MACHINERY DO NOT MIX NARCOTICS WITH OTHER CNS (CENTRAL NERVOUS SYSTEM) DEPRESSANTS SUCH AS ALCOHOL   STOP SMOKING OR USING NICOTINE PRODUCTS!!!!  As discussed nicotine severely impairs your body's ability to heal surgical and traumatic wounds but also impairs bone healing.  Wounds and bone heal by forming microscopic blood vessels (angiogenesis) and nicotine is a vasoconstrictor (essentially, shrinks blood vessels).  Therefore, if vasoconstriction occurs to these microscopic blood vessels they essentially disappear and are unable to deliver necessary nutrients to the healing tissue.  This is one modifiable factor that you can do to dramatically increase your chances of healing your injury.    (This means no smoking, no nicotine gum, patches, etc)  DO NOT USE NONSTEROIDAL ANTI-INFLAMMATORY DRUGS (NSAID'S)  Using products such as Advil (ibuprofen), Aleve (naproxen), Motrin (ibuprofen) for additional pain control during fracture healing can delay and/or prevent the healing response.  If you would like to take over the counter (OTC) medication, Tylenol (acetaminophen) is ok.  However, some narcotic medications that are given for pain control contain acetaminophen as well. Therefore, you should not exceed more than 4000 mg of tylenol in a day if you do not have liver disease.  Also note that there are may OTC medicines, such as cold medicines and allergy medicines that my contain tylenol as well.  If you have any questions about medications and/or  interactions please ask your doctor/PA or your pharmacist.      ICE AND ELEVATE INJURED/OPERATIVE EXTREMITY  Using ice and elevating the injured extremity above your heart can help with swelling and pain control.  Icing in a pulsatile fashion, such as 20 minutes on and 20 minutes off, can be followed.    Do not place ice directly on skin. Make sure there is a barrier between to skin and the ice pack.    Using frozen items such as frozen peas works well as the conform nicely to the are that needs to be iced.  USE AN ACE WRAP OR TED HOSE FOR SWELLING CONTROL  In addition to icing and elevation, Ace wraps or TED hose are used to help limit and resolve swelling.  It is recommended to use Ace wraps or TED hose until you are informed to stop.    When using Ace Wraps start the wrapping distally (farthest away from the body) and wrap proximally (closer to the body)   Example: If you had surgery on your leg or thing and you do not have a splint on, start the ace wrap at the toes and work your way up to the thigh        If you had surgery on your upper extremity and do not have a splint on, start the ace wrap at your fingers and work your way up to the upper arm   [place]: [phone]   VISIT OUR WEBSITE FOR ADDITIONAL INFORMATION: [email or web address]

## 2019-02-25 NOTE — Progress Notes (Signed)
PT Cancellation Note  Patient Details Name: Tina Wade MRN: [id number] DOB: 1928-07-25   Cancelled Treatment:    Reason Eval/Treat Not Completed: Fatigue/lethargy limiting ability to participate.  Pt very emphatically states she is too tired and will not have the energy to even do bed exercises.  Will reattempt therapy at another time.   Ramond Dial 02/25/2019, 2:13 PM   Mee Hives, PT MS Acute Rehab Dept. Number: [place] and [place]

## 2019-02-25 NOTE — TOC Progression Note (Signed)
Transition of Care [facility]) - Progression Note    Patient Details  Name: Tina Wade MRN: [id number] Date of Birth: August 26, 1928  Transition of Care [facility]) CM/SW [place], LCSW Phone Number: 02/25/2019, 3:21 PM  Clinical Narrative:     CSW spoke with patients daughter, Marcene Brawn, regarding disposition plans. Daughter is agreeable to HH/ DME needs- selected [facility].   CSW contacted Tiffany with [facility] and completed referral. CSW will complete DME needs once orders are placed.   Expected Discharge Plan: [place] Barriers to Discharge: Continued Medical Work up  Expected Discharge Plan and Services Expected Discharge Plan: [place] In-house Referral: Clinical Social Work   Post Acute Care Choice: Museum/gallery conservator, [place] arrangements for the past 2 months: Single Family Home                 DME Arranged: (not yet arranged)         HH Arranged: PT [place]: [facility] [facility] [employer] Date [place]: 02/25/19 Time [place]: [place] Representative spoke with at [place]: [place] ([place]) Interventions    Readmission Risk Interventions No flowsheet data found.

## 2019-02-25 NOTE — Progress Notes (Signed)
ANTICOAGULATION CONSULT NOTE - Follow Up Consult  Pharmacy Consult for Warfarin Indication: atrial fibrillation and mechanical MVR (1996)  No Known Allergies  Patient Measurements: Height: 5\' 6"  (167.6 cm) Weight: 172 lb (78 kg) IBW/kg (Calculated) : 59.3  Vital Signs: Temp: 98.2 F (36.8 C) (08/11 0836) Temp Source: Oral (08/11 0836) BP: 132/71 (08/11 0956) Pulse Rate: 67 (08/11 0956)  Labs: Recent Labs    02/23/19 0604 02/24/19 0422 02/25/19 0330  HGB 11.2* 10.6* 10.4*  HCT 38.9 35.5* 35.3*  PLT 144* 136* 123*  LABPROT 26.0* 23.7* 27.5*  INR 2.4* 2.2* 2.6*  CREATININE 0.68 0.75 0.74    Estimated Creatinine Clearance: 52.2 mL/min (by C-G formula based on SCr of 0.74 mg/dL).  Assessment:   85 yr old female admitted 8/8 with femoral neck fracture. On Warfarin PTA for hx mechanical MVR and afib.   Warfarin held 8/8 for surgery on 8/9, resumed on 8/9 pm.      INR remains therapeutic(2.6). Trended back up (from 2.2) after 5 mg dose on 8/10.     CBC trending down some, no bleeding reported.      PTA Warfarin regimen:  2.5 mg MWF, 5 mg TTSS.  Goal of Therapy:  INR 2-3 Monitor platelets by anticoagulation protocol: Yes   Plan:   Warfarin 2.5 mg today.  Daily PT/INR.   Arty Baumgartner, [place] Pager: [phone] or phone: [phone] 02/25/2019,12:19 PM

## 2019-02-25 NOTE — Progress Notes (Signed)
Orthopaedic Trauma Progress Note  S: Patient doing well this morning, minimal pain at rest. Had some difficulty and pain in the hip with ambulating yesterday, but was able to get to bedside chair with therapies. Patient just received breathing treatment. Continues to have a deep cough which she says was present before coming to the hospital. States she has post-nasal drip at baseline, requesting some Tylenol Cold and Flu.   O:  Vitals:   02/24/19 2051 02/25/19 0412  BP: (!) 126/56 (!) 124/51  Pulse: 66 66  Resp: 12 12  Temp: 97.7 F (36.5 C) 97.8 F (36.6 C)  SpO2: 99% 97%    General - Sitting up in bed, NAD. Pleasant and cooperative Left Lower Extremity - Dressing removed, incision is clean, dry, intact. No tenderness with palpation of hip, thigh, knee. Dorsiflexion/plantarflexion intact. Able to wiggle toes. Compartments soft and compressible. Sensation intact distally to light touch. 2+ DP pulse  Imaging: Stable post op imaging.   Labs:  Results for orders placed or performed during the hospital encounter of 02/22/19 (from the past 24 hour(s))  Protime-INR     Status: Abnormal   Collection Time: 02/25/19  3:30 AM  Result Value Ref Range   Prothrombin Time 27.5 (H) 11.4 - 15.2 seconds   INR 2.6 (H) 0.8 - 1.2  Basic metabolic panel     Status: Abnormal   Collection Time: 02/25/19  3:30 AM  Result Value Ref Range   Sodium 136 135 - 145 mmol/L   Potassium 5.4 (H) 3.5 - 5.1 mmol/L   Chloride 99 98 - 111 mmol/L   CO2 29 22 - 32 mmol/L   Glucose, Bld 100 (H) 70 - 99 mg/dL   BUN 25 (H) 8 - 23 mg/dL   Creatinine, Ser 0.74 0.44 - 1.00 mg/dL   Calcium 9.1 8.9 - 10.3 mg/dL   GFR calc non Af Amer >60 >60 mL/min   GFR calc Af Amer >60 >60 mL/min   Anion gap 8 5 - 15  CBC     Status: Abnormal   Collection Time: 02/25/19  3:30 AM  Result Value Ref Range   WBC 7.4 4.0 - 10.5 K/uL   RBC 3.63 (L) 3.87 - 5.11 MIL/uL   Hemoglobin 10.4 (L) 12.0 - 15.0 g/dL   HCT 35.3 (L) 36.0 - 46.0 %    MCV 97.2 80.0 - 100.0 fL   MCH 28.7 26.0 - 34.0 pg   MCHC 29.5 (L) 30.0 - 36.0 g/dL   RDW 15.2 11.5 - 15.5 %   Platelets 123 (L) 150 - 400 K/uL   nRBC 0.0 0.0 - 0.2 %    Assessment: 85 year old female s/p fall  Injuries: Left femoral neck fracture s/p percutaneous fixation  Weightbearing: WBAT LLE  Insicional and dressing care: leave incision open to air   Orthopedic device(s): None  CV/Blood loss: Acute blood loss anemia, Hgb 10.4. Hemodynamically stable  Pain management:  1. Tylenol 650 mg q 6 hours scheduled 2. Robaxin 500 mg q 6 hours PRN 3. Hydrocodone 5/325 mg q 6 hours PRN 4. Neurontin 100 mg TID 5. Morphine 2 mg q 2 hours PRN  VTE prophylaxis: Coumadin  ID: [id number] post op completed  Foley/Lines: Foley removed. KVO IVFs  Medical co-morbidities: mechanical mitral valve, hypertension and, A. fib on Coumadin   Dispo: PT/OT eval today, recommending home health PT. Would like patient to mobilize a little more with therapy without increase in pain before discharging home. Hopefully can  d/c tomorrow or Thursday   Follow - up plan: 2 weeks for repeat x-rays    Azul Coffie A. Carmie [facility] ?([phone] (phone)

## 2019-02-26 LAB — TYPE AND SCREEN
ABO/RH(D): A POS
Antibody Screen: POSITIVE
Donor AG Type: NEGATIVE
Donor AG Type: NEGATIVE
Donor AG Type: NEGATIVE
PT AG Type: NEGATIVE
Unit division: 0
Unit division: 0
Unit division: 0

## 2019-02-26 LAB — BASIC METABOLIC PANEL
Anion gap: 7 (ref 5–15)
BUN: 28 mg/dL — ABNORMAL HIGH (ref 8–23)
CO2: 29 mmol/L (ref 22–32)
Calcium: 9 mg/dL (ref 8.9–10.3)
Chloride: 98 mmol/L (ref 98–111)
Creatinine, Ser: 0.72 mg/dL (ref 0.44–1.00)
GFR calc Af Amer: >60 mL/min (ref >60)
GFR calc non Af Amer: >60 mL/min (ref >60)
Glucose, Bld: 109 mg/dL — ABNORMAL HIGH (ref 70–99)
Potassium: 5.2 mmol/L — ABNORMAL HIGH (ref 3.5–5.1)
Sodium: 134 mmol/L — ABNORMAL LOW (ref 135–145)

## 2019-02-26 LAB — CBC
HCT: 34.7 % — ABNORMAL LOW (ref 36.0–46.0)
Hemoglobin: 10.3 g/dL — ABNORMAL LOW (ref 12.0–15.0)
MCH: 28.5 pg (ref 26.0–34.0)
MCHC: 29.7 g/dL — ABNORMAL LOW (ref 30.0–36.0)
MCV: 95.9 fL (ref 80.0–100.0)
Platelets: 165 10*3/uL (ref 150–400)
RBC: 3.62 MIL/uL — ABNORMAL LOW (ref 3.87–5.11)
RDW: 15.5 % (ref 11.5–15.5)
WBC: 6.8 10*3/uL (ref 4.0–10.5)
nRBC: 0 % (ref 0.0–0.2)

## 2019-02-26 LAB — BPAM RBC
Blood Product Expiration Date: 202008172359
Blood Product Expiration Date: 202009042359
Blood Product Expiration Date: 202009042359
Unit Type and Rh: 600
Unit Type and Rh: 600
Unit Type and Rh: 6200

## 2019-02-26 LAB — PROTIME-INR
INR: 2.5 — ABNORMAL HIGH (ref 0.8–1.2)
Prothrombin Time: 26.6 seconds — ABNORMAL HIGH (ref 11.4–15.2)

## 2019-02-26 MED ORDER — WARFARIN SODIUM 2.5 MG PO TABS
2.5000 mg | ORAL_TABLET | Freq: Once | ORAL | Status: AC
  Administered 2019-02-26: 2.5 mg via ORAL
  Filled 2019-02-26: qty 1

## 2019-02-26 NOTE — Progress Notes (Signed)
Progress Note   Tina Wade:[id number] DOB: 1929/03/23 DOA: 02/22/2019  PCP: Bernerd Limbo, MD  Patient coming from: Home  Chief Complaint: Fall.  HPI: Tina Wade is a 85 y.o. female with history of mechanical mitral valve, hypertension and, A. fib was walking at home when she slipped and fell did not hit her head or lose consciousness.  Hurt her left side and was brought to the ER. In the ER x-ray showed left hip fracture on-call orthopedic surgeon Dr. Doreatha Martin was consulted.  CT head C-spine were negative.  COVID-19 test was negative.  EKG shows A. fib rest of the labs show hemoglobin of 11.7 platelets 192 creatinine 1.7 INR 2.   Subjective: No acute issues/events since admission. Pain well controlled.  Looking forward to physical therapy later today. Denies shortness of breath, chest pain, headache, fevers, chills. Patient does report ongoing cough but states this is somewhat chronic (>4 months in duration).  Assessment/Plan Principal Problem:   Closed left hip fracture, initial encounter [facility]) Active Problems:   H/O mitral valve replacement   Chronic atrial fibrillation   Essential hypertension   Closed left hip fracture status post mechanical fall  -S/P percutaneous fixation with cannulated screws 02/23/19 -INR 2.6 now - coumadin ongoing per pharmacy dosing -Pain well controlled -PT/OT to continue evaluation; possibly home with HH/PT in the next 24-48h -unfortunately PT unable to evaluate patient yesterday as she was "too tired" -hoping patient will have energy to participate today, lengthy discussion with patient at bedside about need for participation as this will dictate timing for her disposition  Acute hypoxia, likely 2/2 sedation/narcotics, resolved -Remains off oxygen without hypoxia -Will need ambulatory O2 screen once able to ambulate with PT/OT  Transient obstructive uropathy s/p foley removal, resolved -After foley removal yesterday patient had voiding  issues but appears to have resolved at this point -Repeat bladder scan PRN symptoms/fullness  Hyperkalemia, improving -Resume home diuretics - follow with am labs -Unclear etiology for diuretics - Echo 05/2018 EF 50-55% without overt dysfunction (?mild decrease systolic function)  Mechanical mitral valve on Coumadin  -Resume coumadin; INR trending dialy - WNL  Chronic atrial fibrillation rate controlled.   -Coumadin resumed as above -Patient not on any rate control per home med list - continue to follow  History of TIA  -Continue on statins -coumadin is on hold - also on 81mg  asa at home  COPD, not in acute exacerbation -not actively wheezing on inhalers.  Hypertension, essential, well controlled -Continue on Cozaar.  Hypothyroidism  -Continue Synthroid.   DVT prophylaxis: SCDs in anticipation of surgery. Code Status: DNR confirmed with patient. Disposition Plan: Pending clinical status - likely to DC home with HH/PT in the next 24-48h pending improvement in ambulation/pain Consults called: Orthopedics. Admission status: Inpatient, postop recovery - requiring pain management and PT/OT evaluation to ensure safe disposition  Prior to Admission medications   Medication Sig Start Date End Date Taking? Authorizing Provider  aspirin 81 MG tablet Take 81 mg by mouth daily.      [provider]  calcium carbonate (OS-CAL) 600 MG TABS tablet Take 600 mg by mouth 2 (two) times daily with a meal.    [provider]  furosemide (LASIX) 40 MG tablet Take 40 mg by mouth daily.     [provider]  gabapentin (NEURONTIN) 100 MG capsule Take 1 capsule (100 mg total) by mouth at bedtime and may repeat dose one time if needed. 11/04/18 11/04/19  Garvin Fila,  MD  levothyroxine (SYNTHROID, LEVOTHROID) 100 MCG tablet Take 100 mcg by mouth daily.     [provider]  losartan (COZAAR) 25 MG tablet Take 1 tablet (25 mg total) by mouth daily. 03/27/14   Leonie Man, MD  montelukast (SINGULAIR) 10 MG tablet Take 10 mg by mouth at bedtime.    [provider]  rosuvastatin (CRESTOR) 10 MG tablet Take 10 mg by mouth daily. 02/14/18   [provider]  Tiotropium Bromide-Olodaterol (STIOLTO RESPIMAT) 2.5-2.5 MCG/ACT AERS USE 2 INHALATIONS DAILY 06/17/18   [provider]  warfarin (COUMADIN) 5 MG tablet TAKE ONE TO ONE AND ONE-HALF TABLETS DAILY OR AS DIRECTED Patient taking differently: Take 5-7.5 mg by mouth daily.     Rockne Menghini, RPH-CPP    Physical Exam: Constitutional: Moderately built and nourished. Vitals:   02/25/19 2010 02/25/19 2031 02/26/19 0440 02/26/19 0810  BP:  (!) 122/51 (!) 138/52 (!) 125/45  Pulse: 70 70 67 66  Resp: 16 14 14 18   Temp:  98.4 F (36.9 C) 97.9 F (36.6 C)   TempSrc:  Oral Oral   SpO2:  91% 93% 90%  Weight:      Height:       General:  Pleasantly resting in bed, No acute distress. HEENT:  Normocephalic atraumatic.  Sclerae nonicteric, noninjected.  Extraocular movements intact bilaterally. Neck:  Without mass or deformity.  Trachea is midline. Lungs:  Clear to auscultate bilaterally without rhonchi, wheeze, or rales. Heart:  Regular rate and rhythm.  Without murmurs, rubs, or gallops. Abdomen:  Soft, nontender, nondistended.  Without guarding or rebound. Extremities: Without cyanosis, clubbing, edema, or obvious deformity. Vascular:  Dorsalis pedis and posterior tibial pulses palpable bilaterally. Skin: Bandage clean dry and intact. Warm and dry, no erythema, no ulcerations.  Labs on Admission: I have personally reviewed following labs and imaging studies  CBC: Recent Labs  Lab 02/22/19 0200 02/23/19 0604 02/24/19 0422 02/25/19 0330 02/26/19 0249  WBC 9.9 7.9 8.7 7.4 6.8  NEUTROABS 8.0*  --   --   --   --   HGB 11.7* 11.2* 10.6* 10.4* 10.3*  HCT 40.0 38.9 35.5* 35.3* 34.7*  MCV 94.8 97.0 95.2 97.2 95.9  PLT 192 144* 136* 123* 921   Basic Metabolic Panel: Recent  Labs  Lab 02/22/19 0200 02/23/19 0604 02/24/19 0422 02/25/19 0330 02/26/19 0249  NA 137 136 134* 136 134*  K 4.0 4.8 5.2* 5.4* 5.2*  CL 101 98 98 99 98  CO2 26 27 29 29 29   GLUCOSE 152* 109* 125* 100* 109*  BUN 19 16 20  25* 28*  CREATININE 0.78 0.68 0.75 0.74 0.72  CALCIUM 9.1 9.3 9.4 9.1 9.0   GFR: Estimated Creatinine Clearance: 52.2 mL/min (by C-G formula based on SCr of 0.72 mg/dL). Liver Function Tests: No results for input(s): AST, ALT, ALKPHOS, BILITOT, PROT, ALBUMIN in the last 168 hours. No results for input(s): LIPASE, AMYLASE in the last 168 hours. No results for input(s): AMMONIA in the last 168 hours. Coagulation Profile: Recent Labs  Lab 02/22/19 0200 02/23/19 0604 02/24/19 0422 02/25/19 0330 02/26/19 0249  INR 2.6* 2.4* 2.2* 2.6* 2.5*   Urine analysis:    Component Value Date/Time   COLORURINE YELLOW 09/06/2010 0057   APPEARANCEUR CLOUDY (A) 09/06/2010 0057   LABSPEC 1.020 09/06/2010 0057   PHURINE 7.0 09/06/2010 0057   GLUCOSEU NEGATIVE 11/06/2009 [place] 09/06/2010 0057   BILIRUBINUR NEGATIVE 09/06/2010 0057   KETONESUR NEGATIVE  09/06/2010 0057   PROTEINUR NEGATIVE 09/06/2010 0057   UROBILINOGEN 0.2 09/06/2010 0057   NITRITE NEGATIVE 09/06/2010 0057   LEUKOCYTESUR NEGATIVE 09/06/2010 0057    Recent Results (from the past 240 hour(s))  SARS Coronavirus 2 [facility] order, Performed in [facility] hospital lab) Nasopharyngeal Nasopharyngeal Swab     Status: None   Collection Time: 02/22/19  2:26 AM   Specimen: Nasopharyngeal Swab  Result Value Ref Range Status   SARS Coronavirus 2 NEGATIVE NEGATIVE Final    Comment: (NOTE) If result is NEGATIVE SARS-CoV-2 target nucleic acids are NOT DETECTED. The SARS-CoV-2 RNA is generally detectable in upper and lower  respiratory specimens during the acute phase of infection. The lowest  concentration of SARS-CoV-2 viral copies this assay can detect is 250  copies / mL. A negative result  does not preclude SARS-CoV-2 infection  and should not be used as the sole basis for treatment or other  patient management decisions.  A negative result may occur with  improper specimen collection / handling, submission of specimen other  than nasopharyngeal swab, presence of viral mutation(s) within the  areas targeted by this assay, and inadequate number of viral copies  (<250 copies / mL). A negative result must be combined with clinical  observations, patient history, and epidemiological information. If result is POSITIVE SARS-CoV-2 target nucleic acids are DETECTED. The SARS-CoV-2 RNA is generally detectable in upper and lower  respiratory specimens dur ing the acute phase of infection.  Positive  results are indicative of active infection with SARS-CoV-2.  Clinical  correlation with patient history and other diagnostic information is  necessary to determine patient infection status.  Positive results do  not rule out bacterial infection or co-infection with other viruses. If result is PRESUMPTIVE POSTIVE SARS-CoV-2 nucleic acids MAY BE PRESENT.   A presumptive positive result was obtained on the submitted specimen  and confirmed on repeat testing.  While 2019 novel coronavirus  (SARS-CoV-2) nucleic acids may be present in the submitted sample  additional confirmatory testing may be necessary for epidemiological  and / or clinical management purposes  to differentiate between  SARS-CoV-2 and other Sarbecovirus currently known to infect humans.  If clinically indicated additional testing with an alternate test  methodology [phone]) is advised. The SARS-CoV-2 RNA is generally  detectable in upper and lower respiratory sp ecimens during the acute  phase of infection. The expected result is Negative. Fact Sheet for Patients:  StrictlyIdeas.no Fact Sheet for Healthcare Providers: BankingDealers.co.za This test is not yet approved or  cleared by the [place] FDA and has been authorized for detection and/or diagnosis of SARS-CoV-2 by FDA under an Emergency Use Authorization (EUA).  This EUA will remain in effect (meaning this test can be used) for the duration of the COVID-19 declaration under Section 564(b)(1) of the Act, 21 U.S.C. section 360bbb-3(b)(1), unless the authorization is terminated or revoked sooner. Performed at [facility], [place] [address], [place], [place] [address]   Surgical pcr screen     Status: None   Collection Time: 02/23/19  2:40 AM   Specimen: Nasal Mucosa; Nasal Swab  Result Value Ref Range Status   MRSA, PCR NEGATIVE NEGATIVE Final   Staphylococcus aureus NEGATIVE NEGATIVE Final    Comment: (NOTE) The Xpert SA Assay (FDA approved for NASAL specimens in patients 4 years of age and older), is one component of a comprehensive surveillance program. It is not intended to diagnose infection nor to guide or monitor treatment. Performed at  [facility], [place] [address], [place], [place] [address]      Radiological Exams on Admission: No results found.  EKG: Independently reviewed.  A. fib rate controlled.  Little Ishikawa DO [facility] Pager [phone].  If 7PM-7AM, please contact night-coverage [email or web address] Password [facility]  02/26/2019, 8:17 AM

## 2019-02-26 NOTE — Plan of Care (Signed)

## 2019-02-26 NOTE — Progress Notes (Signed)
Pt's BP 125/45. Holli Humbles, MD paged. No new orders at this time. Will continue to monitor.

## 2019-02-26 NOTE — Progress Notes (Signed)
ANTICOAGULATION CONSULT NOTE - Follow Up Consult  Pharmacy Consult for Warfarin Indication: atrial fibrillation and mechanical MVR (1996)  No Known Allergies  Patient Measurements: Height: 5\' 6"  (167.6 cm) Weight: 172 lb (78 kg) IBW/kg (Calculated) : 59.3  Vital Signs: Temp: 97.8 F (36.6 C) (08/12 0810) Temp Source: Oral (08/12 0810) BP: 125/45 (08/12 0810) Pulse Rate: 66 (08/12 0810)  Labs: Recent Labs    02/24/19 0422 02/25/19 0330 02/26/19 0249  HGB 10.6* 10.4* 10.3*  HCT 35.5* 35.3* 34.7*  PLT 136* 123* 165  LABPROT 23.7* 27.5* 26.6*  INR 2.2* 2.6* 2.5*  CREATININE 0.75 0.74 0.72    Estimated Creatinine Clearance: 52.2 mL/min (by C-G formula based on SCr of 0.72 mg/dL).  Assessment:   85 yr old female admitted 8/8 with femoral neck fracture. On Warfarin PTA for hx mechanical MVR and afib.   Warfarin held 8/8 for surgery on 8/9, resumed on 8/9 pm.      INR remains therapeutic(2.5).      Hgb 10.3 low stable and pltc improved to 165k, no bleeding reported.      PTA Warfarin regimen:  2.5 mg MWF, 5 mg TTSS.  Goal of Therapy:  INR 2-3 Monitor platelets by anticoagulation protocol: Yes   Plan:   Warfarin 2.5 mg today.  Daily PT/INR.   Tina Wade, [place] Clinical Pharmacist Pager: [phone] or phone: [phone] Please check AMION for all [place] phone numbers. After 10:00 PM, call [place] [phone] 02/26/2019,10:45 AM

## 2019-02-26 NOTE — Progress Notes (Signed)
Physical Therapy Treatment Patient Details Name: Tina Wade MRN: [id number] DOB: [birth year] Today's Date: 02/26/2019    History of Present Illness Patient is an 85 year old female admitted after fall at home. Sustained a left hip fracture and is s/p pinning. PMH to includeL CVA, TIA, hypertension, hyperlipidemia, Afib.    PT Comments    Pt performed gait training progression and LE strengthening.  She required min assistance for gait training and transfers.  Pt presented with LOB when standing.  Pt is slow and guarded and will require physical assistance for management at home.  Pt continues to benefit from f/u HHPT.    Follow Up Recommendations  Home health PT     Equipment Recommendations  3in1 (PT)    Recommendations for Other Services       Precautions / Restrictions Precautions Precautions: Fall Restrictions Weight Bearing Restrictions: Yes LLE Weight Bearing: Weight bearing as tolerated    Mobility  Bed Mobility               General bed mobility comments: Pt in recliner on arrival.  Transfers Overall transfer level: Needs assistance Equipment used: Rolling walker (2 wheeled) Transfers: Sit to/from Stand Sit to Stand: Min assist         General transfer comment: Cues for hand placement to and from seated surface, minor LOB posterior noted,  Ambulation/Gait Ambulation/Gait assistance: Min assist Gait Distance (Feet): 20 Feet Assistive device: Rolling walker (2 wheeled) Gait Pattern/deviations: Step-to pattern;Decreased step length - right;Decreased step length - left;Trunk flexed     General Gait Details: Cues for sequencing, upper trunk control and RW  safety.   Stairs             Wheelchair Mobility    Modified Rankin (Stroke Patients Only)       Balance Overall balance assessment: Needs assistance Sitting-balance support: Bilateral upper extremity supported;Feet supported Sitting balance-Leahy Scale: Fair        Standing balance-Leahy Scale: Poor                              Cognition Arousal/Alertness: Awake/alert Behavior During Therapy: WFL for tasks assessed/performed Overall Cognitive Status: Within Functional Limits for tasks assessed                                        Exercises Total Joint Exercises Ankle Circles/Pumps: AROM;20 reps;Both;Supine Quad Sets: AROM;10 reps;Supine;Left Heel Slides: AAROM;Left;10 reps;Supine Hip ABduction/ADduction: AAROM;Left;10 reps;Supine Long Arc Quad: AROM;Left;10 reps;Seated    General Comments        Pertinent Vitals/Pain Pain Assessment: 0-10 Pain Score: 5  Pain Location: L hip Pain Descriptors / Indicators: Aching;Discomfort;Sore;Grimacing;Guarding;Operative site guarding Pain Intervention(s): Monitored during session;Repositioned    Home Living                      Prior Function            PT Goals (current goals can now be found in the care plan section) Acute Rehab PT Goals Patient Stated Goal: to return home with daughter Potential to Achieve Goals: Good    Frequency    Min 5X/week      PT Plan Current plan remains appropriate    Co-evaluation              AM-PAC PT "6 Clicks" Mobility  Outcome Measure  Help needed turning from your back to your side while in a flat bed without using bedrails?: A Lot Help needed moving from lying on your back to sitting on the side of a flat bed without using bedrails?: A Lot Help needed moving to and from a bed to a chair (including a wheelchair)?: A Little Help needed standing up from a chair using your arms (e.g., wheelchair or bedside chair)?: A Little Help needed to walk in hospital room?: A Little Help needed climbing 3-5 steps with a railing? : A Little 6 Click Score: 16    End of Session Equipment Utilized During Treatment: Gait belt Activity Tolerance: Patient limited by fatigue;Patient limited by pain Patient left: in  chair;with chair alarm set;with call bell/phone within reach Nurse Communication: Mobility status PT Visit Diagnosis: Unsteadiness on feet (R26.81);Muscle weakness (generalized) (M62.81);Difficulty in walking, not elsewhere classified (R26.2);Pain;History of falling (Z91.81) Pain - Right/Left: Left Pain - part of body: Hip     Time: 1204-1217 PT Time Calculation (min) (ACUTE ONLY): 13 min  Charges:  $Gait Training: 8-22 mins                     Governor Rooks, PTA Acute Rehabilitation Services Pager [phone] Office [phone]     Verdine Grenfell Eli Hose 02/26/2019, 1:16 PM

## 2019-02-26 NOTE — Care Management Important Message (Signed)
Important Message  Patient Details  Name: Tina Wade MRN: [id number] Date of Birth: 20-Jul-1928   Medicare Important Message Given:  Yes     Memory Argue 02/26/2019, 3:59 PM

## 2019-02-27 LAB — BASIC METABOLIC PANEL
Anion gap: 8 (ref 5–15)
BUN: 24 mg/dL — ABNORMAL HIGH (ref 8–23)
CO2: 31 mmol/L (ref 22–32)
Calcium: 9.1 mg/dL (ref 8.9–10.3)
Chloride: 96 mmol/L — ABNORMAL LOW (ref 98–111)
Creatinine, Ser: 0.77 mg/dL (ref 0.44–1.00)
GFR calc Af Amer: >60 mL/min (ref >60)
GFR calc non Af Amer: >60 mL/min (ref >60)
Glucose, Bld: 104 mg/dL — ABNORMAL HIGH (ref 70–99)
Potassium: 4.7 mmol/L (ref 3.5–5.1)
Sodium: 135 mmol/L (ref 135–145)

## 2019-02-27 LAB — CBC
HCT: 34.9 % — ABNORMAL LOW (ref 36.0–46.0)
Hemoglobin: 10.4 g/dL — ABNORMAL LOW (ref 12.0–15.0)
MCH: 28 pg (ref 26.0–34.0)
MCHC: 29.8 g/dL — ABNORMAL LOW (ref 30.0–36.0)
MCV: 94.1 fL (ref 80.0–100.0)
Platelets: 162 10*3/uL (ref 150–400)
RBC: 3.71 MIL/uL — ABNORMAL LOW (ref 3.87–5.11)
RDW: 15.6 % — ABNORMAL HIGH (ref 11.5–15.5)
WBC: 6 10*3/uL (ref 4.0–10.5)
nRBC: 0 % (ref 0.0–0.2)

## 2019-02-27 LAB — PROTIME-INR
INR: 2.1 — ABNORMAL HIGH (ref 0.8–1.2)
Prothrombin Time: 23.4 seconds — ABNORMAL HIGH (ref 11.4–15.2)

## 2019-02-27 MED ORDER — WARFARIN SODIUM 5 MG PO TABS: 5.0000 mg | ORAL_TABLET | Freq: Once | ORAL | Status: DC

## 2019-02-27 MED ORDER — HYDROCODONE-ACETAMINOPHEN 5-325 MG PO TABS: 1.0000 | ORAL_TABLET | Freq: Four times a day (QID) | ORAL | 0 refills | Status: DC | PRN

## 2019-02-27 MED ORDER — LOSARTAN POTASSIUM 25 MG PO TABS: 25.0000 mg | ORAL_TABLET | Freq: Every day | ORAL | 2 refills | Status: DC

## 2019-02-27 NOTE — Discharge Summary (Signed)
Physician Discharge Summary  KOURTNIE SACHS ZOX:[id number] DOB: 23-May-1929 DOA: 02/22/2019  PCP: Bernerd Limbo, MD  Admit date: 02/22/2019 Discharge date: 02/27/2019  Admitted From: Home Disposition: Home with home health  Recommendations for Outpatient Follow-up:  1. Follow up with PCP in 1-2 weeks; orthopedic surgery as scheduled 2. Please obtain BMP/CBC in one week  Home Health: Yes Equipment/Devices: 3 and 1  Discharge Condition: Stable CODE STATUS: DNR Diet recommendation: As tolerated  Brief/Interim Summary: ELAINE ROANHORSE is a 85 y.o. female with history of mechanical mitral valve, hypertension and, A. fib was walking at home when she slipped and fell did not hit her head or lose consciousness.  Hurt her left side and was brought to the ER. In the ER x-ray showed left hip fracture on-call orthopedic surgeon Dr. Doreatha Martin was consulted.  CT head C-spine were negative.  COVID-19 test was negative.  EKG shows A. fib rest of the labs show hemoglobin of 11.7 platelets 192 creatinine 1.7 INR 2.  Patient admitted as above with mechanical fall and subsequently noted left hip fracture.  Patient tolerated percutaneous fixation with cannulated screws on 02/23/2019.  Patient not ambulating with minimal difficulty, PT OT recommending home health with ongoing physical therapy.  Otherwise patient clinically appears quite well, no acute issues or events overnight otherwise requesting discharge home which is certainly reasonable given her resolution of symptoms, ambulatory dysfunction and status post procedure as above.  Patient will follow up with PCP and orthopedic surgery as scheduled.  Patient did have episode of transient obstructive uropathy after Foley removal post procedure but this resolved.  Patient will need to follow with PCP, if this becomes an ongoing issue certainly urology consult would be warranted.  Resume patient's home medications, will hold patient's potassium supplementation given  borderline hyperkalemia here in house -will need labs with PCP within 3 to 5 days to ensure resolution of hyperkalemia as well as to ensure no ongoing hypokalemia given the patient remains on diuretics..   Discharge Diagnoses:  Principal Problem:   Closed left hip fracture, initial encounter [facility]) Active Problems:   H/O mitral valve replacement   Chronic atrial fibrillation   Essential hypertension  Discharge Instructions  Discharge Instructions    Call MD for:  difficulty breathing, headache or visual disturbances   Complete by: As directed    Call MD for:  extreme fatigue   Complete by: As directed    Call MD for:  persistant dizziness or light-headedness   Complete by: As directed    Call MD for:  redness, tenderness, or signs of infection (pain, swelling, redness, odor or green/yellow discharge around incision site)   Complete by: As directed    Call MD for:  severe uncontrolled pain   Complete by: As directed    Call MD for:  temperature >100.4   Complete by: As directed    Diet - low sodium heart healthy   Complete by: As directed    Increase activity slowly   Complete by: As directed      Allergies as of 02/27/2019   No Known Allergies     Medication List    STOP taking these medications   POTASSIMIN PO     TAKE these medications   albuterol 108 (90 Base) MCG/ACT inhaler Commonly known as: VENTOLIN HFA Inhale 2 puffs into the lungs as needed.   aspirin 81 MG tablet Take 81 mg by mouth daily.   calcium carbonate 600 MG Tabs tablet Commonly known as: OS-CAL  Take 600 mg by mouth daily with breakfast.   ferrous sulfate 325 (65 FE) MG tablet Take 325 mg by mouth daily at 12 noon.   furosemide 40 MG tablet Commonly known as: LASIX Take 40 mg by mouth daily.   HYDROcodone-acetaminophen 5-325 MG tablet Commonly known as: NORCO/VICODIN Take 1 tablet by mouth every 6 (six) hours as needed for severe pain.   levothyroxine 112 MCG tablet Commonly known as:  SYNTHROID Take 112 mcg by mouth daily at 12 noon. 12 noon   losartan 25 MG tablet Commonly known as: COZAAR Take 1 tablet (25 mg total) by mouth daily.   montelukast 10 MG tablet Commonly known as: SINGULAIR Take 10 mg by mouth at bedtime.   multivitamin with minerals Tabs tablet Take 1 tablet by mouth daily.   rosuvastatin 10 MG tablet Commonly known as: CRESTOR Take 10 mg by mouth at bedtime.   Stiolto Respimat 2.5-2.5 MCG/ACT Aers Generic drug: Tiotropium Bromide-Olodaterol Inhale 2 puffs into the lungs daily.   triamcinolone cream 0.1 % Commonly known as: KENALOG Apply 1 application topically as needed for rash.   Tylenol Cold & Flu Day/Night Lqpk Generic drug: PE-DM-GG-APAP&PE-Doxyl-DM-APAP Take by mouth as needed. Cup that came with the medication no more than four times a day   warfarin 5 MG tablet Commonly known as: COUMADIN TAKE ONE TO ONE AND ONE-HALF TABLETS DAILY OR AS DIRECTED What changed: See the new instructions.            Durable Medical Equipment  (From admission, onward)         Start     Ordered   02/27/19 0807  DME 3-in-1  Once     02/27/19 3382         Follow-up Information    Haddix, Thomasene Lot, MD. Schedule an appointment as soon as possible for a visit in 2 week(s).   Specialty: Orthopedic Surgery Why: for repeat x-rays Contact information: [place] [address] [phone]          No Known Allergies  Consultations:  Orthopedic surgery  Procedures/Studies: Dg Chest 1 View  Result Date: 02/22/2019 CLINICAL DATA:  Recent fall with chest pain, initial encounter EXAM: CHEST  1 VIEW COMPARISON:  01/10/2012 FINDINGS: Cardiac shadow is mildly enlarged but stable. Aortic calcifications are again seen and stable. The lungs are well aerated bilaterally. Mild right basilar atelectasis is seen. Postsurgical changes are noted. IMPRESSION: Mild right basilar atelectasis. Electronically Signed   By: Inez Catalina M.D.    On: 02/22/2019 02:05   Ct Head Wo Contrast  Result Date: 02/22/2019 CLINICAL DATA:  Recent fall with headaches and neck pain, initial encounter EXAM: CT HEAD WITHOUT CONTRAST CT CERVICAL SPINE WITHOUT CONTRAST TECHNIQUE: Multidetector CT imaging of the head and cervical spine was performed following the standard protocol without intravenous contrast. Multiplanar CT image reconstructions of the cervical spine were also generated. COMPARISON:  05/24/2018 FINDINGS: CT HEAD FINDINGS Brain: Chronic atrophic changes and white matter ischemic changes are seen. Prior lacunar infarct in the medial aspect of the left temporal lobe is seen. Left thalamic lacunar infarct is noted as well. Changes are stable from the prior exam. No acute hemorrhage or acute infarction is seen. Vascular: No hyperdense vessel or unexpected calcification. Skull: Normal. Negative for fracture or focal lesion. Sinuses/Orbits: No acute finding. Other: None. CT CERVICAL SPINE FINDINGS Alignment: Within normal limits. Skull base and vertebrae: 7 cervical segments are well visualized. Vertebral body height is well maintained.  Facet hypertrophic changes and osteophytic changes are seen. Disc space narrowing at C5-6 C6-7 is noted. No acute fracture or acute facet abnormality is noted Soft tissues and spinal canal: Surrounding soft tissue structures are within normal limits. Upper chest: Visualized lung apices are within normal limits. Other: None IMPRESSION: CT of the head: Chronic atrophic and ischemic changes. No acute abnormality noted. CT of the cervical spine: Multilevel degenerative change without acute abnormality. Electronically Signed   By: Inez Catalina M.D.   On: 02/22/2019 01:57   Ct Cervical Spine Wo Contrast  Result Date: 02/22/2019 CLINICAL DATA:  Recent fall with headaches and neck pain, initial encounter EXAM: CT HEAD WITHOUT CONTRAST CT CERVICAL SPINE WITHOUT CONTRAST TECHNIQUE: Multidetector CT imaging of the head and cervical  spine was performed following the standard protocol without intravenous contrast. Multiplanar CT image reconstructions of the cervical spine were also generated. COMPARISON:  05/24/2018 FINDINGS: CT HEAD FINDINGS Brain: Chronic atrophic changes and white matter ischemic changes are seen. Prior lacunar infarct in the medial aspect of the left temporal lobe is seen. Left thalamic lacunar infarct is noted as well. Changes are stable from the prior exam. No acute hemorrhage or acute infarction is seen. Vascular: No hyperdense vessel or unexpected calcification. Skull: Normal. Negative for fracture or focal lesion. Sinuses/Orbits: No acute finding. Other: None. CT CERVICAL SPINE FINDINGS Alignment: Within normal limits. Skull base and vertebrae: 7 cervical segments are well visualized. Vertebral body height is well maintained. Facet hypertrophic changes and osteophytic changes are seen. Disc space narrowing at C5-6 C6-7 is noted. No acute fracture or acute facet abnormality is noted Soft tissues and spinal canal: Surrounding soft tissue structures are within normal limits. Upper chest: Visualized lung apices are within normal limits. Other: None IMPRESSION: CT of the head: Chronic atrophic and ischemic changes. No acute abnormality noted. CT of the cervical spine: Multilevel degenerative change without acute abnormality. Electronically Signed   By: Inez Catalina M.D.   On: 02/22/2019 01:57   Ct Hip Left Wo Contrast  Result Date: 02/22/2019 CLINICAL DATA:  85 year old female with history of left hip pain. Evaluate for left hip fracture. EXAM: CT OF THE LEFT HIP WITHOUT CONTRAST TECHNIQUE: Multidetector CT imaging of the left hip was performed according to the standard protocol. Multiplanar CT image reconstructions were also generated. COMPARISON:  No priors.  Left hip radiograph 02/22/2019. FINDINGS: Bones/Joint/Cartilage Nondisplaced subcapital fracture again noted. No other acute displaced fracture identified in the  visualized portions of the axial and appendicular skeleton. Femoral head remains located. Joint space narrowing, subchondral sclerosis, subchondral cyst formation and osteophyte formation in the hip joint, compatible with moderate osteoarthritis. Ligaments Suboptimally assessed by CT. Muscles and Tendons Unremarkable. Soft tissues Unremarkable. IMPRESSION: 1. Nondisplaced subcapital left hip fracture redemonstrated, as above. 2. Moderate left hip joint osteoarthritis. Electronically Signed   By: Vinnie Langton M.D.   On: 02/22/2019 08:22   Dg C-arm 1-60 Min  Result Date: 02/23/2019 CLINICAL DATA:  Left hip pinning. EXAM: DG C-ARM 61-120 MIN; DG HIP (WITH OR WITHOUT PELVIS) 2-3V LEFT COMPARISON:  CT of the left hip February 22, 2019 FINDINGS: Intraoperative fluoroscopic images from left hip pinning demonstrate 3 cancellous screws placement across sub capitellar left femoral neck fracture. Osseous alignment is near anatomic. No new fractures. Fluoroscopy time is recorded as 1 minutes 27 seconds. IMPRESSION: Intraoperative fluoroscopic images from left hip pinning. Electronically Signed   By: Fidela Salisbury M.D.   On: 02/23/2019 15:51   Dg Hip Operative Unilat With  Pelvis Left  Result Date: 02/23/2019 CLINICAL DATA:  Left hip pinning. FLUOROSCOPY TIME:  1 minutes 27 seconds. Images: 5 EXAM: OPERATIVE LEFT HIP (WITH PELVIS IF PERFORMED) 5 VIEWS TECHNIQUE: Fluoroscopic spot image(s) were submitted for interpretation post-operatively. COMPARISON:  None. FINDINGS: Three screws have been placed across the subcapital fracture in the left hip. IMPRESSION: Three screws have been placed across the subcapital fracture in the left hip. Electronically Signed   By: Dorise Bullion III M.D   On: 02/23/2019 15:50   Dg Hip Unilat W Or W/o Pelvis 2-3 Views Left  Result Date: 02/23/2019 CLINICAL DATA:  Left hip pinning. EXAM: DG C-ARM 61-120 MIN; DG HIP (WITH OR WITHOUT PELVIS) 2-3V LEFT COMPARISON:  CT of the left hip  February 22, 2019 FINDINGS: Intraoperative fluoroscopic images from left hip pinning demonstrate 3 cancellous screws placement across sub capitellar left femoral neck fracture. Osseous alignment is near anatomic. No new fractures. Fluoroscopy time is recorded as 1 minutes 27 seconds. IMPRESSION: Intraoperative fluoroscopic images from left hip pinning. Electronically Signed   By: Fidela Salisbury M.D.   On: 02/23/2019 15:51   Dg Hip Unilat With Pelvis 2-3 Views Left  Result Date: 02/22/2019 CLINICAL DATA:  Recent fall with left hip pain, initial encounter EXAM: DG HIP (WITH OR WITHOUT PELVIS) 2-3V LEFT COMPARISON:  None. FINDINGS: Pelvic ring is intact. There is a subcapital left femoral neck fracture with mild impaction at the fracture site. No other focal abnormality is noted. IMPRESSION: Subcapital left femoral neck fracture. Electronically Signed   By: Inez Catalina M.D.   On: 02/22/2019 02:06    Subjective: No acute issues or events overnight, pain well controlled, ambulating without assistance, declines chest pain, shortness of breath, nausea, vomiting, diarrhea, constipation, headache, fevers, chills.   Discharge Exam: Vitals:   02/27/19 0506 02/27/19 0731  BP: (!) 144/56   Pulse: 66 65  Resp: 15 16  Temp: 98 F (36.7 C)   SpO2: 91% 92%   Vitals:   02/26/19 1711 02/26/19 2008 02/27/19 0506 02/27/19 0731  BP: (!) 147/54 (!) 132/48 (!) 144/56   Pulse: 72 68 66 65  Resp: 18 16 15 16   Temp:  98.3 F (36.8 C) 98 F (36.7 C)   TempSrc:  Oral Oral   SpO2: 93% 92% 91% 92%  Weight:      Height:        General:  Pleasantly resting in bed, No acute distress. HEENT:  Normocephalic atraumatic.  Sclerae nonicteric, noninjected.  Extraocular movements intact bilaterally. Neck:  Without mass or deformity.  Trachea is midline. Lungs:  Clear to auscultate bilaterally without rhonchi, wheeze, or rales. Heart:  Regular rate and rhythm.  Without murmurs, rubs, or gallops. Abdomen:  Soft,  nontender, nondistended.  Without guarding or rebound. Extremities: Without cyanosis, clubbing, edema, or obvious deformity. Vascular:  Dorsalis pedis and posterior tibial pulses palpable bilaterally. Skin: Bandage clean dry and intact. Warm and dry, no erythema, no ulcerations.  The results of significant diagnostics from this hospitalization (including imaging, microbiology, ancillary and laboratory) are listed below for reference.     Microbiology: Recent Results (from the past 240 hour(s))  SARS Coronavirus 2 [facility] order, Performed in [facility] hospital lab) Nasopharyngeal Nasopharyngeal Swab     Status: None   Collection Time: 02/22/19  2:26 AM   Specimen: Nasopharyngeal Swab  Result Value Ref Range Status   SARS Coronavirus 2 NEGATIVE NEGATIVE Final    Comment: (NOTE) If result is NEGATIVE SARS-CoV-2 target nucleic  acids are NOT DETECTED. The SARS-CoV-2 RNA is generally detectable in upper and lower  respiratory specimens during the acute phase of infection. The lowest  concentration of SARS-CoV-2 viral copies this assay can detect is 250  copies / mL. A negative result does not preclude SARS-CoV-2 infection  and should not be used as the sole basis for treatment or other  patient management decisions.  A negative result may occur with  improper specimen collection / handling, submission of specimen other  than nasopharyngeal swab, presence of viral mutation(s) within the  areas targeted by this assay, and inadequate number of viral copies  (<250 copies / mL). A negative result must be combined with clinical  observations, patient history, and epidemiological information. If result is POSITIVE SARS-CoV-2 target nucleic acids are DETECTED. The SARS-CoV-2 RNA is generally detectable in upper and lower  respiratory specimens dur ing the acute phase of infection.  Positive  results are indicative of active infection with SARS-CoV-2.  Clinical  correlation with patient  history and other diagnostic information is  necessary to determine patient infection status.  Positive results do  not rule out bacterial infection or co-infection with other viruses. If result is PRESUMPTIVE POSTIVE SARS-CoV-2 nucleic acids MAY BE PRESENT.   A presumptive positive result was obtained on the submitted specimen  and confirmed on repeat testing.  While 2019 novel coronavirus  (SARS-CoV-2) nucleic acids may be present in the submitted sample  additional confirmatory testing may be necessary for epidemiological  and / or clinical management purposes  to differentiate between  SARS-CoV-2 and other Sarbecovirus currently known to infect humans.  If clinically indicated additional testing with an alternate test  methodology [phone]) is advised. The SARS-CoV-2 RNA is generally  detectable in upper and lower respiratory sp ecimens during the acute  phase of infection. The expected result is Negative. Fact Sheet for Patients:  StrictlyIdeas.no Fact Sheet for Healthcare Providers: BankingDealers.co.za This test is not yet approved or cleared by the [place] FDA and has been authorized for detection and/or diagnosis of SARS-CoV-2 by FDA under an Emergency Use Authorization (EUA).  This EUA will remain in effect (meaning this test can be used) for the duration of the COVID-19 declaration under Section 564(b)(1) of the Act, 21 U.S.C. section 360bbb-3(b)(1), unless the authorization is terminated or revoked sooner. Performed at [facility], [address] [place], [place] [address]   Surgical pcr screen     Status: None   Collection Time: 02/23/19  2:40 AM   Specimen: Nasal Mucosa; Nasal Swab  Result Value Ref Range Status   MRSA, PCR NEGATIVE NEGATIVE Final   Staphylococcus aureus NEGATIVE NEGATIVE Final    Comment: (NOTE) The Xpert SA Assay (FDA approved for NASAL specimens in patients 73 years of age and older),  is one component of a comprehensive surveillance program. It is not intended to diagnose infection nor to guide or monitor treatment. Performed at [facility], [place] [address], [place], [place] [address]      Labs: BNP (last 3 results) No results for input(s): BNP in the last 8760 hours. Basic Metabolic Panel: Recent Labs  Lab 02/23/19 0604 02/24/19 0422 02/25/19 0330 02/26/19 0249 02/27/19 0352  NA 136 134* 136 134* 135  K 4.8 5.2* 5.4* 5.2* 4.7  CL 98 98 99 98 96*  CO2 27 29 29 29 31   GLUCOSE 109* 125* 100* 109* 104*  BUN 16 20 25* 28* 24*  CREATININE 0.68 0.75 0.74 0.72  0.77  CALCIUM 9.3 9.4 9.1 9.0 9.1   CBC: Recent Labs  Lab 02/22/19 0200 02/23/19 0604 02/24/19 0422 02/25/19 0330 02/26/19 0249 02/27/19 0352  WBC 9.9 7.9 8.7 7.4 6.8 6.0  NEUTROABS 8.0*  --   --   --   --   --   HGB 11.7* 11.2* 10.6* 10.4* 10.3* 10.4*  HCT 40.0 38.9 35.5* 35.3* 34.7* 34.9*  MCV 94.8 97.0 95.2 97.2 95.9 94.1  PLT 192 144* 136* 123* 165 162   Urinalysis    Component Value Date/Time   COLORURINE YELLOW 09/06/2010 0057   APPEARANCEUR CLOUDY (A) 09/06/2010 0057   LABSPEC 1.020 09/06/2010 0057   PHURINE 7.0 09/06/2010 0057   GLUCOSEU NEGATIVE 11/06/2009 1300   HGBUR NEGATIVE 09/06/2010 0057   BILIRUBINUR NEGATIVE 09/06/2010 [place] 09/06/2010 0057   PROTEINUR NEGATIVE 09/06/2010 0057   UROBILINOGEN 0.2 09/06/2010 0057   NITRITE NEGATIVE 09/06/2010 0057   LEUKOCYTESUR NEGATIVE 09/06/2010 0057   Microbiology Recent Results (from the past 240 hour(s))  SARS Coronavirus 2 [facility] order, Performed in [facility] hospital lab) Nasopharyngeal Nasopharyngeal Swab     Status: None   Collection Time: 02/22/19  2:26 AM   Specimen: Nasopharyngeal Swab  Result Value Ref Range Status   SARS Coronavirus 2 NEGATIVE NEGATIVE Final    Comment: (NOTE) If result is NEGATIVE SARS-CoV-2 target nucleic acids are NOT DETECTED. The SARS-CoV-2 RNA is generally  detectable in upper and lower  respiratory specimens during the acute phase of infection. The lowest  concentration of SARS-CoV-2 viral copies this assay can detect is 250  copies / mL. A negative result does not preclude SARS-CoV-2 infection  and should not be used as the sole basis for treatment or other  patient management decisions.  A negative result may occur with  improper specimen collection / handling, submission of specimen other  than nasopharyngeal swab, presence of viral mutation(s) within the  areas targeted by this assay, and inadequate number of viral copies  (<250 copies / mL). A negative result must be combined with clinical  observations, patient history, and epidemiological information. If result is POSITIVE SARS-CoV-2 target nucleic acids are DETECTED. The SARS-CoV-2 RNA is generally detectable in upper and lower  respiratory specimens dur ing the acute phase of infection.  Positive  results are indicative of active infection with SARS-CoV-2.  Clinical  correlation with patient history and other diagnostic information is  necessary to determine patient infection status.  Positive results do  not rule out bacterial infection or co-infection with other viruses. If result is PRESUMPTIVE POSTIVE SARS-CoV-2 nucleic acids MAY BE PRESENT.   A presumptive positive result was obtained on the submitted specimen  and confirmed on repeat testing.  While 2019 novel coronavirus  (SARS-CoV-2) nucleic acids may be present in the submitted sample  additional confirmatory testing may be necessary for epidemiological  and / or clinical management purposes  to differentiate between  SARS-CoV-2 and other Sarbecovirus currently known to infect humans.  If clinically indicated additional testing with an alternate test  methodology [phone]) is advised. The SARS-CoV-2 RNA is generally  detectable in upper and lower respiratory sp ecimens during the acute  phase of infection. The  expected result is Negative. Fact Sheet for Patients:  StrictlyIdeas.no Fact Sheet for Healthcare Providers: BankingDealers.co.za This test is not yet approved or cleared by the [place] FDA and has been authorized for detection and/or diagnosis of SARS-CoV-2 by FDA under an Emergency Use Authorization (EUA).  This EUA will remain in effect (meaning this test can be used) for the duration of the COVID-19 declaration under Section 564(b)(1) of the Act, 21 U.S.C. section 360bbb-3(b)(1), unless the authorization is terminated or revoked sooner. Performed at [facility], [place] [address], [place], [place] [address]   Surgical pcr screen     Status: None   Collection Time: 02/23/19  2:40 AM   Specimen: Nasal Mucosa; Nasal Swab  Result Value Ref Range Status   MRSA, PCR NEGATIVE NEGATIVE Final   Staphylococcus aureus NEGATIVE NEGATIVE Final    Comment: (NOTE) The Xpert SA Assay (FDA approved for NASAL specimens in patients 56 years of age and older), is one component of a comprehensive surveillance program. It is not intended to diagnose infection nor to guide or monitor treatment. Performed at [facility], [place] [address], [place], [place] [address]    Time coordinating discharge: Over 30 minutes  SIGNED:  Little Ishikawa, DO [facility] 02/27/2019, 8:07 AM

## 2019-02-27 NOTE — Progress Notes (Signed)
Occupational Therapy Treatment Patient Details Name: Tina Wade MRN: [id number] DOB: 1928-08-25 Today's Date: 02/27/2019    History of present illness Patient is an 85 year old female admitted after fall at home. Sustained a left hip fracture and is s/p pinning. PMH to includeL CVA, TIA, hypertension, hyperlipidemia, Afib.   OT comments  Pt making good progress with functional goals.Sit - stand min A from recliner. Pt ambulated from recliner to BSc x 10 feet with RW. Pt completed toileting tasks with min A, UB dressing set up/sup and LB dressing mod A to don underwear. OT will continue to follow acutely  Follow Up Recommendations  Home health OT    Equipment Recommendations  3 in 1 bedside commode;Other (comment)(reacher)    Recommendations for Other Services      Precautions / Restrictions Precautions Precautions: Fall Restrictions Weight Bearing Restrictions: Yes LLE Weight Bearing: Weight bearing as tolerated       Mobility Bed Mobility Overal bed mobility: Needs Assistance Bed Mobility: Supine to Sit     Supine to sit: Min assist;HOB elevated     General bed mobility comments: pt in recliner upon OT arrival  Transfers Overall transfer level: Needs assistance Equipment used: Rolling walker (2 wheeled) Transfers: Sit to/from Stand Sit to Stand: Min assist         General transfer comment: Cues for hand placement and to not reach for front of RW.    Balance Overall balance assessment: Needs assistance Sitting-balance support: Bilateral upper extremity supported;Feet supported Sitting balance-Leahy Scale: Fair     Standing balance support: Bilateral upper extremity supported;During functional activity Standing balance-Leahy Scale: Poor                             ADL either performed or assessed with clinical judgement   ADL Overall ADL's : Needs assistance/impaired Eating/Feeding: Independent;Sitting   Grooming: Wash/dry  hands;Wash/dry face;Min guard;Standing           Upper Body Dressing : Supervision/safety;Set up;Sitting   Lower Body Dressing: Moderate assistance;Sit to/from stand   Toilet Transfer: Minimal assistance;Ambulation;RW;BSC;Cueing for safety;Cueing for sequencing   Toileting- Clothing Manipulation and Hygiene: Minimal assistance;Sit to/from stand       Functional mobility during ADLs: Minimal assistance;Rolling walker;Cueing for safety;Cueing for sequencing       Vision Baseline Vision/History: Wears glasses Wears Glasses: At all times Patient Visual Report: No change from baseline     Perception     Praxis      Cognition Arousal/Alertness: Awake/alert Behavior During Therapy: WFL for tasks assessed/performed Overall Cognitive Status: Within Functional Limits for tasks assessed                                 General Comments: Pt did not want to participate intiially but after education and encouragement she was agreeable.        Exercises Total Joint Exercises Ankle Circles/Pumps: AROM;20 reps;Both;Supine Short Arc Quad: AROM;Left;10 reps;Supine Heel Slides: AAROM;Left;10 reps;Supine Hip ABduction/ADduction: AAROM;Left;10 reps;Supine General Exercises - Lower Extremity Ankle Circles/Pumps: AROM   Shoulder Instructions       General Comments      Pertinent Vitals/ Pain       Pain Assessment: 0-10 Pain Score: 5  Pain Location: L hip Pain Descriptors / Indicators: Aching;Discomfort;Sore;Grimacing;Guarding;Operative site guarding Pain Intervention(s): Monitored during session;Repositioned  Home Living  Prior Functioning/Environment              Frequency  Min 2X/week        Progress Toward Goals  OT Goals(current goals can now be found in the care plan section)  Progress towards OT goals: Progressing toward goals  Acute Rehab OT Goals Patient Stated Goal: to return  home with daughter  Plan Discharge plan remains appropriate    Co-evaluation                 AM-PAC OT "6 Clicks" Daily Activity     Outcome Measure   Help from another person eating meals?: None Help from another person taking care of personal grooming?: A Little Help from another person toileting, which includes using toliet, bedpan, or urinal?: A Little Help from another person bathing (including washing, rinsing, drying)?: A Lot Help from another person to put on and taking off regular upper body clothing?: A Little Help from another person to put on and taking off regular lower body clothing?: A Lot 6 Click Score: 17    End of Session Equipment Utilized During Treatment: Gait belt;Rolling walker;Other (comment)(BSC)  OT Visit Diagnosis: Unsteadiness on feet (R26.81);Other abnormalities of gait and mobility (R26.89);Muscle weakness (generalized) (M62.81);History of falling (Z91.81);Pain Pain - Right/Left: Left Pain - part of body: Hip;Leg   Activity Tolerance Patient tolerated treatment well   Patient Left in chair;with call bell/phone within reach   Nurse Communication          Time: 3832-9191 OT Time Calculation (min): 39 min  Charges: OT General Charges $OT Visit: 1 Visit OT Treatments $Self Care/Home Management : 23-37 mins $Therapeutic Activity: 8-22 mins     Britt Bottom 02/27/2019, 1:33 PM

## 2019-02-27 NOTE — Plan of Care (Signed)
  Problem: Safety: Goal: Ability to remain free from injury will improve Outcome: Progressing   Problem: Pain Managment: Goal: General experience of comfort will improve Outcome: Progressing   

## 2019-02-27 NOTE — Progress Notes (Signed)
Patient discharging home. Discharge instructions explained to patient and patients daughter and they both verbalized understanding. Took all personal belongings including BSC. No further questions or concerns voiced.

## 2019-02-27 NOTE — TOC Progression Note (Addendum)
Transition of Care [facility]) - Progression Note    Patient Details  Name: Tina Wade MRN: [id number] Date of Birth: 1928-12-02  Transition of Care [facility]) CM/SW [place], [place] Phone Number: 02/27/2019, 2:13 PM  Clinical Narrative:     CSW made referral to [facility] with [employer] for 3n1. Patient is set with [facility]. CSW called and left voice message with(Tiffany) [facility] the [facility] d/c today.  Thurmond Butts, MSW, [facility] Clinical Social Worker [phone]    Expected Discharge Plan: [place] Barriers to Discharge: Continued Medical Work up  Expected Discharge Plan and Services Expected Discharge Plan: [place] In-house Referral: Clinical Social Work   Post Acute Care Choice: Durable Medical Equipment, [place] arrangements for the past 2 months: Single Family Home Expected Discharge Date: 02/27/19               DME Arranged: (not yet arranged)         HH Arranged: PT [place]: [facility] (formerly [employer]) Date [place]: 02/25/19 Time [place]: [place] Representative spoke with at [place]: [place] ([place]) Interventions    Readmission Risk Interventions No flowsheet data found.

## 2019-02-27 NOTE — Progress Notes (Signed)
ANTICOAGULATION CONSULT NOTE - Follow Up Consult  Pharmacy Consult for Warfarin Indication: atrial fibrillation and mechanical MVR (1996)  No Known Allergies  Patient Measurements: Height: 5\' 6"  (167.6 cm) Weight: 172 lb (78 kg) IBW/kg (Calculated) : 59.3  Vital Signs: Temp: 97.8 F (36.6 C) (08/13 0921) Temp Source: Oral (08/13 0921) BP: 131/48 (08/13 0921) Pulse Rate: 66 (08/13 0921)  Labs: Recent Labs    02/25/19 0330 02/26/19 0249 02/27/19 0352  HGB 10.4* 10.3* 10.4*  HCT 35.3* 34.7* 34.9*  PLT 123* 165 162  LABPROT 27.5* 26.6* 23.4*  INR 2.6* 2.5* 2.1*  CREATININE 0.74 0.72 0.77    Estimated Creatinine Clearance: 52.2 mL/min (by C-G formula based on SCr of 0.77 mg/dL).  Assessment:   85 yr old female admitted 8/8 with femoral neck fracture. On Warfarin PTA for hx mechanical MVR and afib.   Warfarin held 8/8 for surgery on 8/9, resumed on 8/9 pm.  INR goal 2.5 - 3 per patient and outside records No need for bridge per MD  INR today 2.1  Goal of Therapy:  INR 2.5 - 3 Monitor platelets by anticoagulation protocol: Yes   Plan:  Warfarin 5 mg x 1 Daily INR  Levester Fresh, PharmD, BCPS, BCCCP Clinical Pharmacist [phone]  Please check AMION for all [place] numbers  02/27/2019 12:41 PM

## 2019-02-27 NOTE — Progress Notes (Signed)
Orthopaedic Trauma Progress Note  S: Patient doing well this morning, minimal pain at rest. Pain with ambulation improving. Working well with therapies. Feels ready to go home   O:  Vitals:   02/26/19 2008 02/27/19 0506  BP: (!) 132/48 (!) 144/56  Pulse: 68 66  Resp: 16 15  Temp: 98.3 F (36.8 C) 98 F (36.7 C)  SpO2: 92% 91%    General - Resting in bed comfortably, NAD. Pleasant and cooperative Left Lower Extremity - Incision is clean, dry, intact. No tenderness with palpation of hip, thigh, knee. Dorsiflexion/plantarflexion intact. Able to wiggle toes. Compartments soft and compressible. Sensation intact distally to light touch. 2+ DP pulse  Imaging: Stable post op imaging.   Labs:  Results for orders placed or performed during the hospital encounter of 02/22/19 (from the past 24 hour(s))  Protime-INR     Status: Abnormal   Collection Time: 02/27/19  3:52 AM  Result Value Ref Range   Prothrombin Time 23.4 (H) 11.4 - 15.2 seconds   INR 2.1 (H) 0.8 - 1.2  Basic metabolic panel     Status: Abnormal   Collection Time: 02/27/19  3:52 AM  Result Value Ref Range   Sodium 135 135 - 145 mmol/L   Potassium 4.7 3.5 - 5.1 mmol/L   Chloride 96 (L) 98 - 111 mmol/L   CO2 31 22 - 32 mmol/L   Glucose, Bld 104 (H) 70 - 99 mg/dL   BUN 24 (H) 8 - 23 mg/dL   Creatinine, Ser 0.77 0.44 - 1.00 mg/dL   Calcium 9.1 8.9 - 10.3 mg/dL   GFR calc non Af Amer >60 >60 mL/min   GFR calc Af Amer >60 >60 mL/min   Anion gap 8 5 - 15  CBC     Status: Abnormal   Collection Time: 02/27/19  3:52 AM  Result Value Ref Range   WBC 6.0 4.0 - 10.5 K/uL   RBC 3.71 (L) 3.87 - 5.11 MIL/uL   Hemoglobin 10.4 (L) 12.0 - 15.0 g/dL   HCT 34.9 (L) 36.0 - 46.0 %   MCV 94.1 80.0 - 100.0 fL   MCH 28.0 26.0 - 34.0 pg   MCHC 29.8 (L) 30.0 - 36.0 g/dL   RDW 15.6 (H) 11.5 - 15.5 %   Platelets 162 150 - 400 K/uL   nRBC 0.0 0.0 - 0.2 %    Assessment: 85 year old female s/p fall  Injuries: Left femoral neck fracture  s/p percutaneous fixation  Weightbearing: WBAT LLE  Insicional and dressing care: leave incision open to air   Orthopedic device(s): None  CV/Blood loss: Hgb stable at 10.4. Hemodynamically stable  Pain management:  1. Tylenol 650 mg q 6 hours scheduled 2. Robaxin 500 mg q 6 hours PRN 3. Hydrocodone 5/325 mg q 6 hours PRN 4. Neurontin 100 mg TID 5. Morphine 0.5 mg q 2 hours PRN  VTE prophylaxis: Coumadin  ID: [id number] 2gm post op completed  Foley/Lines: Foley removed. KVO IVFs  Medical co-morbidities: mechanical mitral valve, hypertension and, A. fib on Coumadin   Dispo:Up with therapy. Okay for discharge from ortho standpoint. D/C pain rx has been sent to pharmacy. Continue home dose coumadin for DVT prophylaxis  Follow - up plan: 2 weeks for repeat x-rays    Camarie Mctigue A. Carmie [facility] ?[phone]? (phone)

## 2019-02-27 NOTE — Progress Notes (Signed)
Physical Therapy Treatment Patient Details Name: Tina Wade MRN: [id number] DOB: [birth year] Today's Date: 02/27/2019    History of Present Illness Patient is an 85 year old female admitted after fall at home. Sustained a left hip fracture and is s/p pinning. PMH to includeL CVA, TIA, hypertension, hyperlipidemia, Afib.    PT Comments    Pt performed gt training and functional mobility during session this am.  She required encouragement to participate in session.  Performed and reviewed HEP for home use.  Pt reports she already has her HEP put away so did not issue one as patient reports she did not need another one.  Pt to d/c home today but will need a BED SIDE COMMODE for d/c.      Follow Up Recommendations  Home health PT     Equipment Recommendations  3in1 (PT)    Recommendations for Other Services       Precautions / Restrictions Precautions Precautions: Fall Restrictions Weight Bearing Restrictions: Yes LLE Weight Bearing: Weight bearing as tolerated    Mobility  Bed Mobility Overal bed mobility: Needs Assistance Bed Mobility: Supine to Sit     Supine to sit: Min assist;HOB elevated     General bed mobility comments: Pt required assistance to advance LEs and elevate trunk into a seated position.  Transfers Overall transfer level: Needs assistance Equipment used: Rolling walker (2 wheeled) Transfers: Sit to/from Stand Sit to Stand: Min assist         General transfer comment: Cues for hand placement and to not reach for front of RW.  Ambulation/Gait Ambulation/Gait assistance: Min assist Gait Distance (Feet): 20 Feet   Gait Pattern/deviations: Step-to pattern;Decreased step length - right;Decreased step length - left;Trunk flexed     General Gait Details: Cues for sequencing, upper trunk control and RW  safety.  Pt refused to progress gt further due to fatigue.   Stairs             Wheelchair Mobility    Modified Rankin (Stroke  Patients Only)       Balance Overall balance assessment: Needs assistance   Sitting balance-Leahy Scale: Fair       Standing balance-Leahy Scale: Poor                              Cognition Arousal/Alertness: Awake/alert Behavior During Therapy: WFL for tasks assessed/performed Overall Cognitive Status: Within Functional Limits for tasks assessed                                 General Comments: Pt did not want to participate intiially but after education and encouragement she was agreeable.      Exercises Total Joint Exercises Ankle Circles/Pumps: AROM;20 reps;Both;Supine Short Arc Quad: AROM;Left;10 reps;Supine Heel Slides: AAROM;Left;10 reps;Supine Hip ABduction/ADduction: AAROM;Left;10 reps;Supine General Exercises - Lower Extremity Ankle Circles/Pumps: AROM    General Comments        Pertinent Vitals/Pain Pain Assessment: 0-10 Pain Score: 5  Pain Location: L hip Pain Descriptors / Indicators: Aching;Discomfort;Sore;Grimacing;Guarding;Operative site guarding Pain Intervention(s): Monitored during session;Repositioned    Home Living                      Prior Function            PT Goals (current goals can now be found in the care plan section) Acute Rehab  PT Goals Patient Stated Goal: to return home with daughter Potential to Achieve Goals: Good Progress towards PT goals: Progressing toward goals    Frequency    Min 5X/week      PT Plan Current plan remains appropriate    Co-evaluation              AM-PAC PT "6 Clicks" Mobility   Outcome Measure  Help needed turning from your back to your side while in a flat bed without using bedrails?: A Little Help needed moving from lying on your back to sitting on the side of a flat bed without using bedrails?: A Little Help needed moving to and from a bed to a chair (including a wheelchair)?: A Little Help needed standing up from a chair using your arms (e.g.,  wheelchair or bedside chair)?: A Little Help needed to walk in hospital room?: A Little Help needed climbing 3-5 steps with a railing? : A Little 6 Click Score: 18    End of Session Equipment Utilized During Treatment: Gait belt Activity Tolerance: Patient limited by fatigue;Patient limited by pain Patient left: in chair;with chair alarm set;with call bell/phone within reach Nurse Communication: Mobility status PT Visit Diagnosis: Unsteadiness on feet (R26.81);Muscle weakness (generalized) (M62.81);Difficulty in walking, not elsewhere classified (R26.2);Pain;History of falling (Z91.81) Pain - Right/Left: Left Pain - part of body: Hip     Time: 7322-0254 PT Time Calculation (min) (ACUTE ONLY): 18 min  Charges:  $Gait Training: 8-22 mins                     Governor Rooks, PTA Acute Rehabilitation Services Pager [phone] Office [phone]     Lamoyne Hessel Eli Hose 02/27/2019, 11:48 AM

## 2019-03-04 ENCOUNTER — Encounter: Payer: Self-pay | Admitting: Student

## 2019-04-18 ENCOUNTER — Other Ambulatory Visit: Payer: Self-pay | Admitting: Student

## 2019-04-18 DIAGNOSIS — M19132 Post-traumatic osteoarthritis, left wrist: Secondary | ICD-10-CM

## 2019-04-25 ENCOUNTER — Ambulatory Visit
Admission: RE | Admit: 2019-04-25 | Discharge: 2019-04-25 | Disposition: A | Discharge status: Home / Self Care | Payer: Medicare Other | Source: Ambulatory Visit | Attending: Student | Admitting: Student

## 2019-04-25 DIAGNOSIS — M19132 Post-traumatic osteoarthritis, left wrist: Secondary | ICD-10-CM

## 2019-05-02 ENCOUNTER — Other Ambulatory Visit: Payer: Self-pay | Admitting: Student

## 2019-05-02 DIAGNOSIS — M1612 Unilateral primary osteoarthritis, left hip: Secondary | ICD-10-CM

## 2019-05-13 ENCOUNTER — Ambulatory Visit
Admission: RE | Admit: 2019-05-13 | Discharge: 2019-05-13 | Disposition: A | Discharge status: Home / Self Care | Payer: Medicare Other | Source: Ambulatory Visit | Attending: Student | Admitting: Student

## 2019-05-13 ENCOUNTER — Other Ambulatory Visit: Payer: Self-pay

## 2019-05-13 DIAGNOSIS — M1612 Unilateral primary osteoarthritis, left hip: Secondary | ICD-10-CM

## 2019-05-13 MED ORDER — METHYLPREDNISOLONE ACETATE 40 MG/ML INJ SUSP (RADIOLOG
120.0000 mg | Freq: Once | INTRAMUSCULAR | Status: AC
  Administered 2019-05-13: 120 mg via INTRA_ARTICULAR

## 2019-05-13 MED ORDER — IOPAMIDOL (ISOVUE-M 200) INJECTION 41%
1.0000 mL | Freq: Once | INTRAMUSCULAR | Status: AC
  Administered 2019-05-13: 1 mL via INTRA_ARTICULAR

## 2019-05-13 NOTE — Discharge Instructions (Signed)

## 2019-10-28 ENCOUNTER — Other Ambulatory Visit: Payer: Self-pay | Admitting: Student

## 2019-10-28 DIAGNOSIS — M1612 Unilateral primary osteoarthritis, left hip: Secondary | ICD-10-CM

## 2019-11-04 ENCOUNTER — Ambulatory Visit
Admission: RE | Admit: 2019-11-04 | Discharge: 2019-11-04 | Disposition: A | Discharge status: Home / Self Care | Payer: Medicare Other | Source: Ambulatory Visit | Attending: Student | Admitting: Student

## 2019-11-04 DIAGNOSIS — M1612 Unilateral primary osteoarthritis, left hip: Secondary | ICD-10-CM

## 2019-11-04 MED ORDER — METHYLPREDNISOLONE ACETATE 40 MG/ML INJ SUSP (RADIOLOG
120.0000 mg | Freq: Once | INTRAMUSCULAR | Status: AC
  Administered 2019-11-04: 120 mg via INTRA_ARTICULAR

## 2019-11-04 MED ORDER — IOPAMIDOL (ISOVUE-M 200) INJECTION 41%
1.0000 mL | Freq: Once | INTRAMUSCULAR | Status: AC
  Administered 2019-11-04: 11:00:00 1 mL via INTRA_ARTICULAR

## 2020-02-05 ENCOUNTER — Telehealth: Payer: Self-pay | Admitting: Neurology

## 2020-02-05 NOTE — Telephone Encounter (Signed)
Error

## 2020-04-05 ENCOUNTER — Ambulatory Visit (INDEPENDENT_AMBULATORY_CARE_PROVIDER_SITE_OTHER): Payer: Medicare Other | Admitting: Neurology

## 2020-04-05 ENCOUNTER — Encounter: Payer: Self-pay | Admitting: Neurology

## 2020-04-05 ENCOUNTER — Other Ambulatory Visit: Payer: Self-pay

## 2020-04-05 VITALS — BP 126/74 | HR 81 | Ht 66.0 in | Wt 173.0 lb

## 2020-04-05 DIAGNOSIS — Z8673 Personal history of transient ischemic attack (TIA), and cerebral infarction without residual deficits: Secondary | ICD-10-CM

## 2020-04-05 DIAGNOSIS — I6990 Unspecified sequelae of unspecified cerebrovascular disease: Secondary | ICD-10-CM

## 2020-04-05 NOTE — Progress Notes (Signed)
[facility] [address] [place]. [place] [address] [phone]       OFFICE FOLLOW UP VISIT NOTE  Tina Wade WILL Date of Birth:  June 12, 1929 Medical Record Number:  [id number]   Referring MD: Billey Chang  Reason for Referral: TIA  HPI: Initial visit 07/31/2018 Tina Wade is a pleasant 86 year old Caucasian lady seen today for initial office consultation visit for TIA.  Tina Wade is accompanied by her daughter.  History is obtained from them, review of electronic medical records and have personally reviewed imaging films.  Tina Wade presented on 05/24/2018 with sudden onset of transient slurred speech and right facial droop.  Symptoms lasted barely 20 minutes and improved by the time Tina Wade reached the hospital.  Tina Wade has a history of chronic atrial fibrillation and mechanical heart valve and is on chronic anticoagulation with warfarin and her INR was 2.88 on admission.  CT scan was negative for bleed or infarct.  MRI scan of the brain was also negative for acute stroke but did show multiple chronic infarcts involving bilateral frontal lobes, left thalamus and bilateral cerebellar hemispheres.  MRI of the brain showed no large vessel stenosis or occlusion.  Carotid ultrasound showed 40 to 59% left ICA stenosis.  Transthoracic echo showed ejection fraction of 50 to 55% with moderate left ventricular hypertrophy.  LDL cholesterol was 69 mg percent.  Hemoglobin A1c was 5.0.  Patient was continued on warfarin and Tina Wade was also on aspirin 81 mg daily which was continued.  Tina Wade was advised to quit smoking.  Tina Wade states Tina Wade is done well since discharge and has not had any further definite stroke or TIA episodes however her daughter was present states that Tina Wade did have remote history of TIAs about 15 years ago.  Over the last several years Tina Wade gets about 2-3 episodes every few months in which Tina Wade has transient word finding difficulties and gets stuck in sentences and is unable to complete this last  barely a minute or so and resolves.  On one occasion Tina Wade found her to be having her hands clenched.  The patient however is fully aware of her surroundings and is never confused or staring.  Tina Wade has not been evaluated for possible seizures however Tina Wade does state that at one point years ago Tina Wade did see neurology in [place] who had raised this possibility.  I do not have those records to review today.  Tina Wade also has a new complaints of of prickling sensation in her feet which is discomforting particularly at night.  Tina Wade does feel that the ground does not feel the same with her feet as it does with her hand when Tina Wade is standing.  Tina Wade feels her balance is off when Tina Wade is had no falls or injuries.  Tina Wade has not been evaluated for neuropathy. Virtual video follow-up visit 11/04/2018:  Tina Wade is seen today for virtual video visit following her last office visit with me on 07/31/2018.  Tina Wade states Tina Wade is done well since that visit.  Tina Wade has had no recurrent stroke, TIA or transient speech episodes.  Tina Wade remains on warfarin which Tina Wade is tolerating well and her INR has been stable.  Tina Wade complains of increasing pain and paresthesias in her feet particularly at night.  Couple of nights a week Tina Wade has to get up and take extra strength Tylenol which helps her go back to sleep.  Continues to have mild gait and balance difficulties but has not had any falls or injuries.  Tina Wade underwent diagnostic  testing which had advised at last visit.  Neuropathy panel labs on 07/31/2018 were all negative except for positive ANA and minimally elevated ESR of 46.  Patient has no history suggestive of lupus and hence the positive ANA is likely false positive incidental finding.  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  EEG done on 09/09/2018 was normal.  MRI scan of the brain done on 08/19/2018 showed old right frontal, bilateral cerebellar and left thalamic lacunar infarcts of remote age.  There is mild  age-related changes of atrophy and small vessel disease.  No significant change compared with previous MRI.  Patient states Tina Wade has no new complaints. Update 04/05/2020: Tina Wade returns for follow-up after last virtual video visit 5 months ago.  Tina Wade is accompanied by her daughter.  Tina Wade is doing well and has not had any recurrent TIA or stroke symptoms since November 19.  Tina Wade was recently seen in the ER a few months ago with UTI with some speech difficulties which are transient and resolved.  CT scan of the head on 02/27/2019 was negative for acute abnormality.  Patient remains on warfarin for her mechanical heart valves tolerating it well and INR has been fairly steady between 2 and 3.  Tina Wade denies any bruising or bleeding.  Tina Wade did have a fall in December 2020 and had a fractured hip for which Tina Wade underwent surgery and is recovering well though Tina Wade still favors the hip while walking.  Tina Wade does complain of some intermittent paresthesias in the left foot and last virtual video visit I recommended trial of gabapentin but Tina Wade did not take it and feels it is not bothersome enough to try medicine at the present time.  Tina Wade remains on Crestor which is tolerating well without muscle aches and pains.  Tina Wade does have an appointment with her primary care physician later this week and plans to have follow-up lipid profile checked.  Tina Wade is living with her daughter but is mostly independent in activities of daily living.  Tina Wade does not drive.  Tina Wade has no new complaints today. ROS:   14 system review of systems is positive for easy bruising, easy bleeding, burning feet, snoring, all other systems negative  PMH:  Past Medical History:  Diagnosis Date  . Arrhythmia    chronic atrial fib. with controlled rate ,on warfarin therapy  . High cholesterol   . Hypertension   . Lower extremity edema    mild -stable  . Mitral valve stenosis 10/1994   s/p St. Jude mechanical valve placement -49M-101 MODEL  . Stroke ([place]) 08/2010   from  office visit 2013- the setting of normal INR of 2.8 ;therefore increased goal to 3.5  . Thyroid disease    hypothyroidism on synthroid    Social History:  Social History   Socioeconomic History  . Marital status: Widowed    Spouse name: Not on file  . Number of children: Not on file  . Years of education: Not on file  . Highest education level: Not on file  Occupational History  . Not on file  Tobacco Use  . Smoking status: Current Every Day Smoker    Packs/day: 0.25    Types: Cigarettes  . Smokeless tobacco: Never Used  . Tobacco comment: one pack every  3 days  Substance and Sexual Activity  . Alcohol use: No  . Drug use: No  . Sexual activity: Not on file  Other Topics Concern  . Not on file  Social  History Narrative  . Not on file   Social Determinants of Health   Financial Resource Strain:   . Difficulty of Paying Living Expenses: Not on file  Food Insecurity:   . Worried About Charity fundraiser in the Last Year: Not on file  . Ran Out of Food in the Last Year: Not on file  Transportation Needs:   . Lack of Transportation (Medical): Not on file  . Lack of Transportation (Non-Medical): Not on file  Physical Activity:   . Days of Exercise per Week: Not on file  . Minutes of Exercise per Session: Not on file  Stress:   . Feeling of Stress : Not on file  Social Connections:   . Frequency of Communication with Friends and Family: Not on file  . Frequency of Social Gatherings with Friends and Family: Not on file  . Attends Religious Services: Not on file  . Active Member of Clubs or Organizations: Not on file  . Attends Archivist Meetings: Not on file  . Marital Status: Not on file  Intimate Partner Violence:   . Fear of Current or Ex-Partner: Not on file  . Emotionally Abused: Not on file  . Physically Abused: Not on file  . Sexually Abused: Not on file    Medications:   Current Outpatient Medications on File Prior to Visit  Medication Sig  Dispense Refill  . albuterol (VENTOLIN HFA) 108 (90 Base) MCG/ACT inhaler Inhale 2 puffs into the lungs as needed.    . calcium carbonate (OS-CAL) 600 MG TABS tablet Take 600 mg by mouth daily with breakfast.     . ferrous sulfate 325 (65 FE) MG tablet Take 325 mg by mouth daily at 12 noon.    . furosemide (LASIX) 40 MG tablet Take 40 mg by mouth daily.     Marland Kitchen levothyroxine (SYNTHROID) 112 MCG tablet Take 112 mcg by mouth daily at 12 noon. 12 noon    . losartan (COZAAR) 25 MG tablet Take 1 tablet (25 mg total) by mouth daily. 90 tablet 2  . montelukast (SINGULAIR) 10 MG tablet Take 10 mg by mouth at bedtime.    . Multiple Vitamin (MULTIVITAMIN WITH MINERALS) TABS tablet Take 1 tablet by mouth daily.    Marland Kitchen PE-DM-GG-APAP&PE-Doxyl-DM-APAP (TYLENOL COLD & FLU DAY/NIGHT) LQPK Take by mouth as needed. Cup that came with the medication no more than four times a day    . rosuvastatin (CRESTOR) 10 MG tablet Take 10 mg by mouth at bedtime.     . Tiotropium Bromide-Olodaterol (STIOLTO RESPIMAT) 2.5-2.5 MCG/ACT AERS Inhale 2 puffs into the lungs daily.     Marland Kitchen triamcinolone cream (KENALOG) 0.1 % Apply 1 application topically as needed for rash.    . warfarin (COUMADIN) 5 MG tablet TAKE ONE TO ONE AND ONE-HALF TABLETS DAILY OR AS DIRECTED (Patient taking differently: Take 2.5-5 mg by mouth See admin instructions. Take 2.5 mg Mon. Wed and Friday Take 5 mg all the other day in the evening) 110 tablet 1   No current facility-administered medications on file prior to visit.    Allergies:  No Known Allergies  Physical Exam General: Frail elderly Caucasian lady, seated, in no evident distress Head: head normocephalic and atraumatic.   Neck: supple with no carotid or supraclavicular bruits Cardiovascular: regular rate and rhythm, no murmurs Musculoskeletal: no deformity Skin:  no rash/petichiae Vascular:  Normal pulses all extremities  Neurologic Exam Mental Status: Awake and fully alert. Oriented to place  and  time. Recent and remote memory intact. Attention span, concentration and fund of knowledge appropriate. Mood and affect appropriate.  Cranial Nerves: Fundoscopic exam not done ns. Pupils equal, briskly reactive to light. Extraocular movements full without nystagmus. Visual fields full to confrontation. Hearing intact. Facial sensation intact. Face, tongue, palate moves normally and symmetrically.  Motor: Normal bulk and tone. Normal strength in all tested extremity muscles. Sensory.:  Diminished  touch , pinprick in both feet in stocking distribution with hyperesthesia but intact, position and vibratory sensation.  Romberg's negative Coordination: Rapid alternating movements normal in all extremities. Finger-to-nose and heel-to-shin performed accurately bilaterally. Gait and Station: Arises from chair without difficulty. Stance is slightly stooped and favors right hip gait demonstrates normal stride length and balance slightly broad-based gait.  And uses a walker..  Not able to heel, toe and tandem walk without difficulty.  Reflexes: 1+ and symmetric. Toes downgoing.   NIHSS  0 Modified Rankin  2   ASSESSMENT: 86 year old lady with episode of transient slurred speech and facial droop in November 2019 likely TIA due to small vessel disease.  Tina Wade also has mechanical heart valve and is on long-term anticoagulation.  Multiple vascular risk factors of mechanical heart valve, chronic atrial fibrillation, hypertension and hyperlipidemia.  History of recurrent episodes of transient speech disturbance of unclear etiology.  Also   complaints of lower extremity paresthesias likely small fiber peripheral neuropathy.    PLAN: I had a long d/w patient and her daughter about her remote TIA, risk for recurrent stroke/TIAs, personally independently reviewed imaging studies and stroke evaluation results and answered questions.Continue warfarin daily  for her history of mechanical heart valve and atrial  fibrillation secondary stroke prevention and maintain strict control of hypertension with blood pressure goal below 130/90, diabetes with hemoglobin A1c goal below 6.5% and lipids with LDL cholesterol goal below 70 mg/dL. I also advised the patient to eat a healthy diet with plenty of whole grains, cereals, fruits and vegetables, exercise regularly and maintain ideal body weight .check follow-up screening carotid ultrasound study.  Advised the patient to discuss her new complaint of left hip pain with upcoming visit with primary care physician later this week.  Followup in the future with me only as necessary and no routine scheduled appointment was made. Greater than 50% time during this   30-minute follow-up visit   was spent on counseling and coordination of care about her strokes, mechanical heart valve, neuropathy and discussion about speech disturbance episodes and answering questions  Antony Contras, MD  [facility] [address] [place], [place] [address]  Phone [phone] Fax [phone] Note: This document was prepared with digital dictation and possible smart phrase technology. Any transcriptional errors that result from this process are unintentional.

## 2020-04-05 NOTE — Patient Instructions (Signed)
I had a long d/w patient and her daughter about her remote TIA, risk for recurrent stroke/TIAs, personally independently reviewed imaging studies and stroke evaluation results and answered questions.Continue warfarin daily  for her history of mechanical heart valve and atrial fibrillation secondary stroke prevention and maintain strict control of hypertension with blood pressure goal below 130/90, diabetes with hemoglobin A1c goal below 6.5% and lipids with LDL cholesterol goal below 70 mg/dL. I also advised the patient to eat a healthy diet with plenty of whole grains, cereals, fruits and vegetables, exercise regularly and maintain ideal body weight .check follow-up screening carotid ultrasound study.  Advised the patient to discuss her new complaint of left hip pain with upcoming visit with primary care physician later this week.  Followup in the future with me only as necessary and no routine scheduled appointment was made.

## 2020-04-06 ENCOUNTER — Institutional Professional Consult (permissible substitution): Payer: 59 | Admitting: Neurology

## 2020-04-15 ENCOUNTER — Other Ambulatory Visit: Payer: Self-pay

## 2020-04-15 ENCOUNTER — Ambulatory Visit (HOSPITAL_COMMUNITY)
Admission: RE | Admit: 2020-04-15 | Discharge: 2020-04-15 | Disposition: A | Discharge status: Home / Self Care | Payer: Medicare Other | Source: Ambulatory Visit | Attending: Neurology | Admitting: Neurology

## 2020-04-15 DIAGNOSIS — Z8673 Personal history of transient ischemic attack (TIA), and cerebral infarction without residual deficits: Secondary | ICD-10-CM | POA: Diagnosis present

## 2020-04-15 DIAGNOSIS — I6990 Unspecified sequelae of unspecified cerebrovascular disease: Secondary | ICD-10-CM | POA: Diagnosis present

## 2020-04-15 NOTE — Progress Notes (Signed)
VASCULAR LAB    Carotid duplex has been performed.  See CV proc for preliminary results.   Whittley Carandang, RVT 04/15/2020, 1:35 PM

## 2020-04-19 NOTE — Progress Notes (Signed)
Kindly inform the patient that carotid ultrasound study showed mild 40 to 59% narrowing of the carotid artery in the neck which is unchanged from previous study from November 2019.  Continue ongoing medical management.

## 2020-04-20 ENCOUNTER — Telehealth: Payer: Self-pay | Admitting: Emergency Medicine

## 2020-04-20 NOTE — Telephone Encounter (Signed)
Called and spoke to patient, went over Dr. Clydene Fake review of patient's Carotid Ultrsound Procedure results.  Patient denied any questions and expressed appreciaiton for the call.

## 2020-04-20 NOTE — Telephone Encounter (Signed)
-----   Message from Garvin Fila, MD sent at 04/19/2020  8:39 AM EDT ----- Tina Wade inform the patient that carotid ultrasound study showed mild 40 to 59% narrowing of the carotid artery in the neck which is unchanged from previous study from November 2019.  Continue ongoing medical management.

## 2021-02-21 IMAGING — RF DG C-ARM 61-120 MIN
1 series · 7 of 7 positions shown · non-contrast
Comparison: CT of the left hip February 22, 2019

CLINICAL DATA: Left hip pinning.

EXAM:
DG C-ARM 61-120 MIN; DG HIP (WITH OR WITHOUT PELVIS) 2-3V LEFT

[Series 1: run · 7 of 7 slices shown]
[im 1/7]
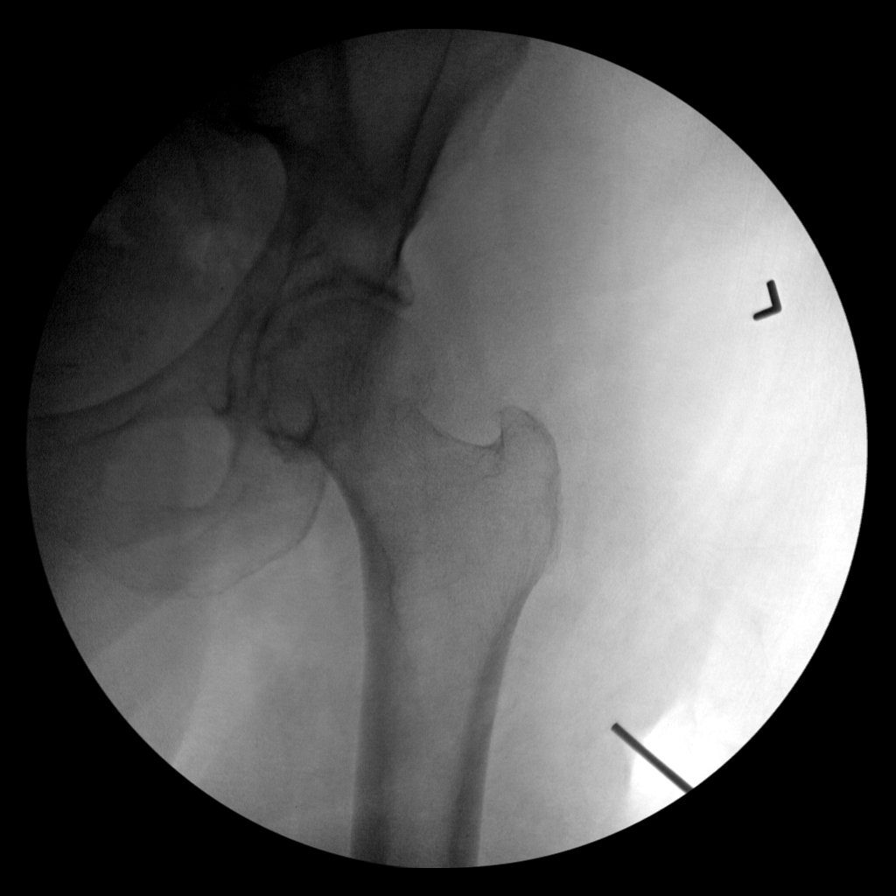
[im 2/7]
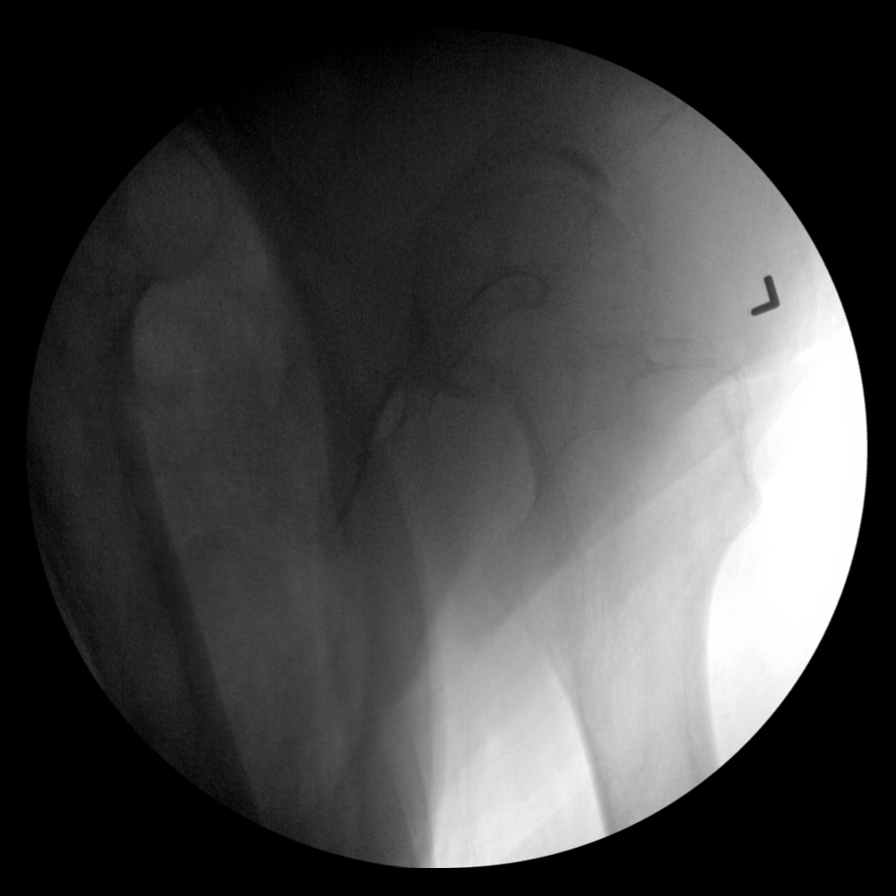
[im 3/7]
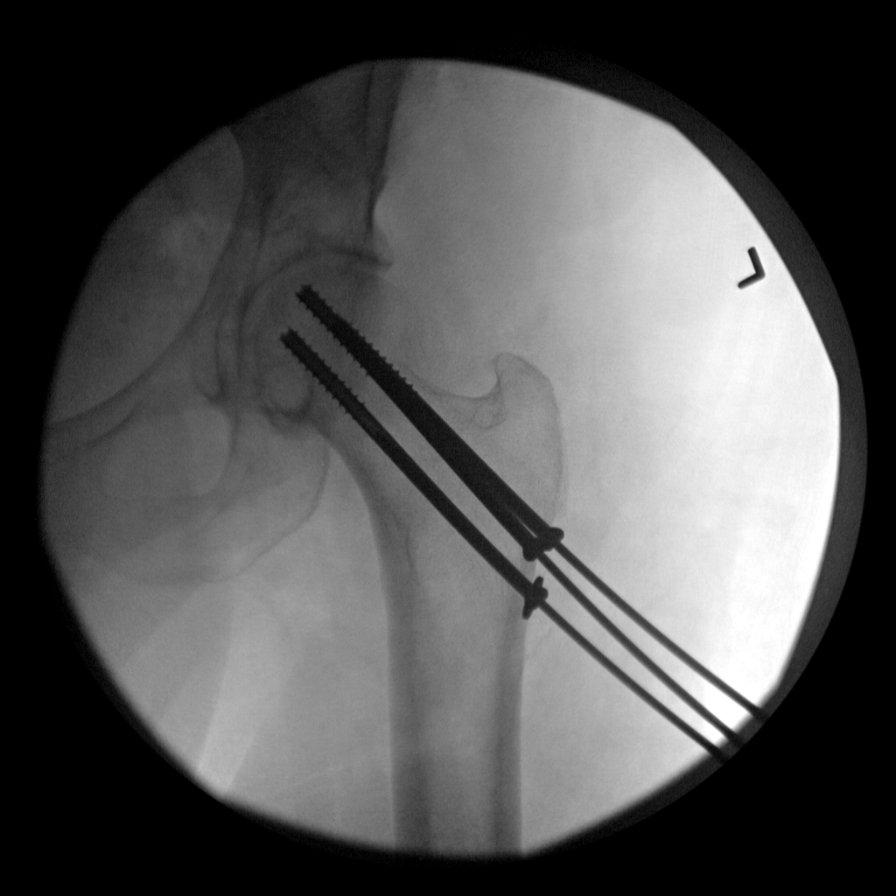
[im 4/7]
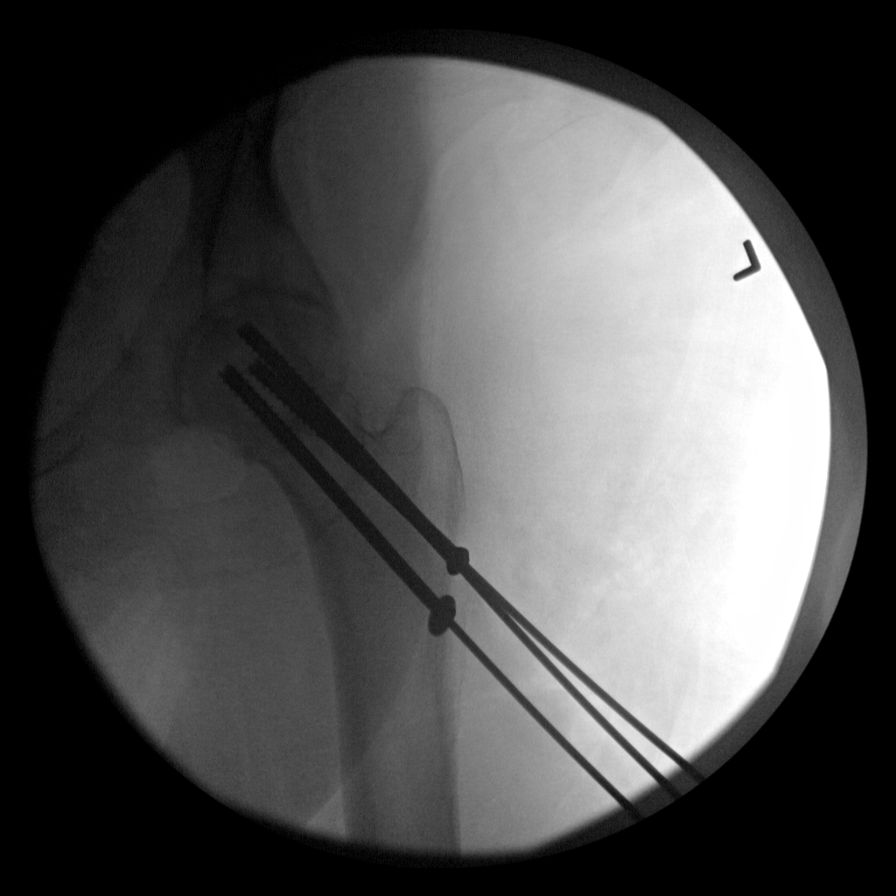
[im 5/7]
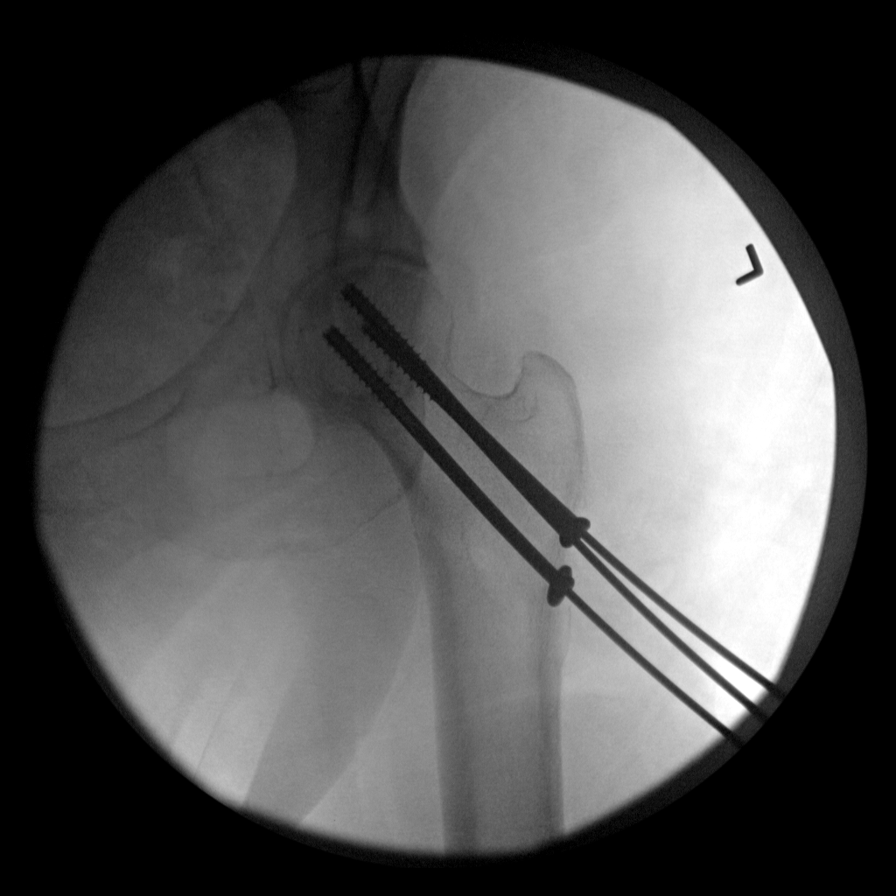
[im 6/7]
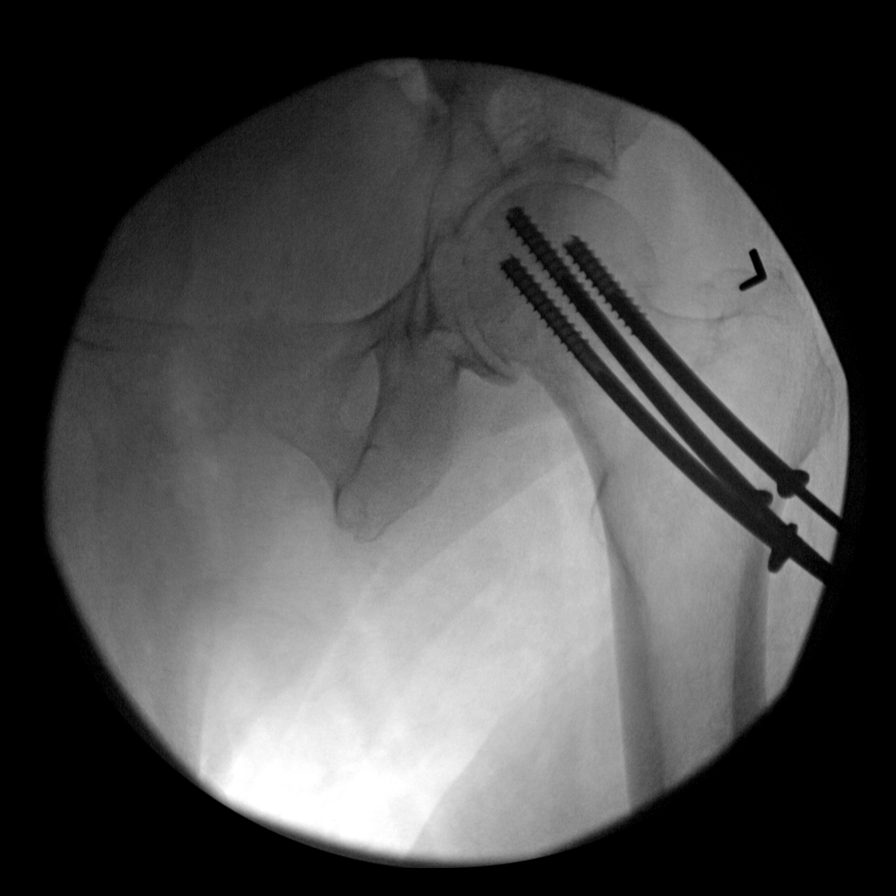
[im 7/7]
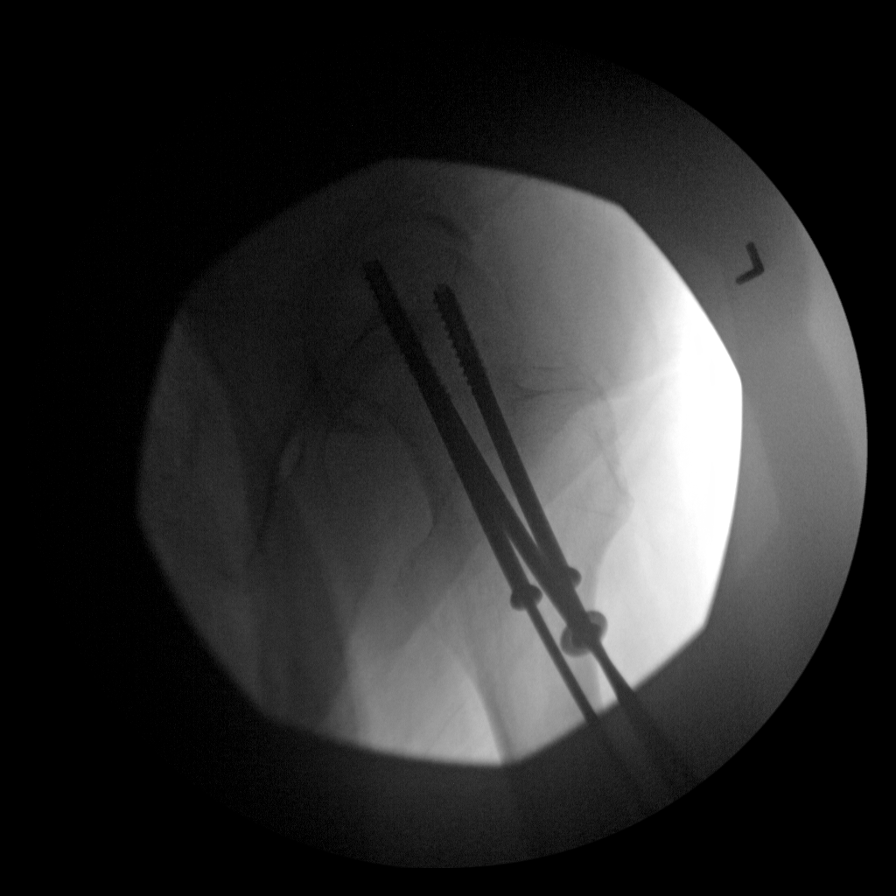

[7 of 7 positions shown; findings below may reference images not displayed]

FINDINGS: Intraoperative fluoroscopic images from left hip pinning demonstrate
3 cancellous screws placement across sub capitellar left femoral
neck fracture. Osseous alignment is near anatomic. No new fractures.

Fluoroscopy time is recorded as 1 minutes 27 seconds.
IMPRESSION: Intraoperative fluoroscopic images from left hip pinning.

## 2021-05-24 ENCOUNTER — Other Ambulatory Visit: Payer: Self-pay | Admitting: Student

## 2021-05-24 DIAGNOSIS — M1612 Unilateral primary osteoarthritis, left hip: Secondary | ICD-10-CM

## 2021-05-25 ENCOUNTER — Ambulatory Visit
Admission: RE | Admit: 2021-05-25 | Discharge: 2021-05-25 | Disposition: A | Discharge status: Home / Self Care | Payer: Medicare Other | Source: Ambulatory Visit | Attending: Student | Admitting: Student

## 2021-05-25 ENCOUNTER — Other Ambulatory Visit: Payer: Self-pay

## 2021-05-25 DIAGNOSIS — M1612 Unilateral primary osteoarthritis, left hip: Secondary | ICD-10-CM

## 2021-05-25 MED ORDER — IOPAMIDOL (ISOVUE-M 200) INJECTION 41%: 1.0000 mL | Freq: Once | INTRAMUSCULAR | Status: DC

## 2021-05-25 MED ORDER — METHYLPREDNISOLONE ACETATE 40 MG/ML INJ SUSP (RADIOLOG: 80.0000 mg | Freq: Once | INTRAMUSCULAR | Status: DC

## 2023-05-24 IMAGING — XA DG FLUORO GUIDE NDL PLC/BX
1 series · 1 of 1 positions shown · non-contrast
Comparison: 11/04/2019

CLINICAL DATA: Arthritis of left hip. Left hip pain. Good response
to prior hip injections.

EXAM:
LEFT HIP INJECTION UNDER FLUOROSCOPY

[Series 1: ortho standard · 1 of 1 slices shown]
[im 1/1]
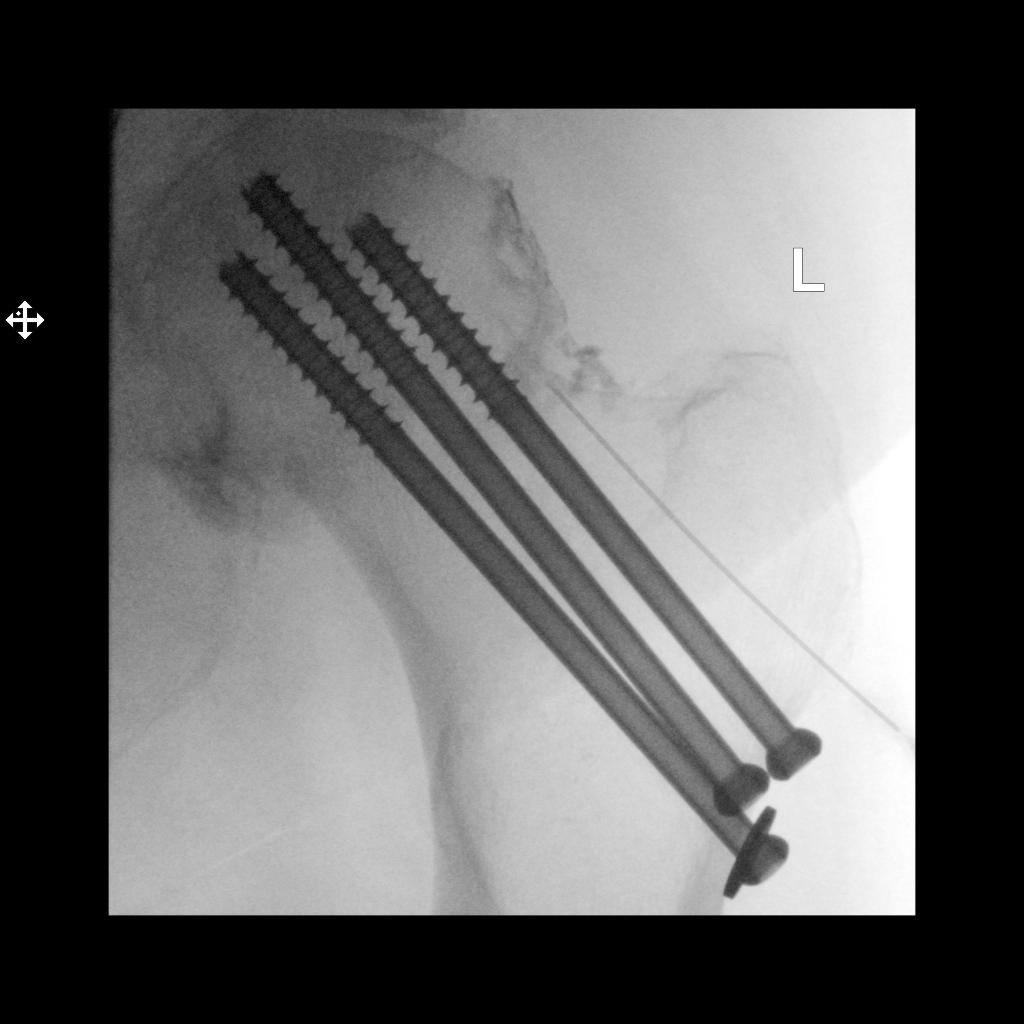

[1 of 1 positions shown; findings below may reference images not displayed]

FLUOROSCOPY TIME:  Fluoroscopy Time:  10 seconds

Radiation Exposure Index (if provided by the fluoroscopic device):
12.33 microGray*m^2

Number of Acquired Spot Images: 0

PROCEDURE:
The overlying skin was prepped with Betadine, draped in the usual
sterile fashion, and infiltrated locally with 1% lidocaine. A
inch 22 gauge spinal needle was advanced to the lateral aspect of
the left femoral head-neck junction. 1 mL of 1% lidocaine injected
easily. Diagnostic injection of a small amount of Isovue-M 2 under
demonstrated intra-articular spread without intravascular component.
80 mg of Depo-Medrol and 4 mL of 0.25% bupivacaine were then
administered. The needle was removed and a sterile dressing was
applied. There was no immediate complication.
IMPRESSION: Technically successful left hip injection under fluoroscopy.

## 2023-07-28 ENCOUNTER — Encounter (HOSPITAL_COMMUNITY): Payer: Self-pay | Admitting: Emergency Medicine

## 2023-07-28 ENCOUNTER — Other Ambulatory Visit: Payer: Self-pay

## 2023-07-28 ENCOUNTER — Emergency Department (HOSPITAL_COMMUNITY): Payer: Medicare Other

## 2023-07-28 ENCOUNTER — Emergency Department (HOSPITAL_COMMUNITY)
Admission: EM | Admit: 2023-07-28 | Discharge: 2023-07-28 | Disposition: A | Discharge status: Home / Self Care | Payer: Medicare Other | Attending: Emergency Medicine | Admitting: Emergency Medicine

## 2023-07-28 DIAGNOSIS — N179 Acute kidney failure, unspecified: Secondary | ICD-10-CM | POA: Insufficient documentation

## 2023-07-28 DIAGNOSIS — M25552 Pain in left hip: Secondary | ICD-10-CM

## 2023-07-28 DIAGNOSIS — Z7901 Long term (current) use of anticoagulants: Secondary | ICD-10-CM | POA: Diagnosis not present

## 2023-07-28 LAB — CBC WITH DIFFERENTIAL/PLATELET
Abs Immature Granulocytes: 0.04 10*3/uL (ref 0.00–0.07)
Basophils Absolute: 0 10*3/uL (ref 0.0–0.1)
Basophils Relative: 0 %
Eosinophils Absolute: 0 10*3/uL (ref 0.0–0.5)
Eosinophils Relative: 0 %
HCT: 44.8 % (ref 36.0–46.0)
Hemoglobin: 14.4 g/dL (ref 12.0–15.0)
Immature Granulocytes: 1 %
Lymphocytes Relative: 7 %
Lymphs Abs: 0.5 10*3/uL — ABNORMAL LOW (ref 0.7–4.0)
MCH: 32.1 pg (ref 26.0–34.0)
MCHC: 32.1 g/dL (ref 30.0–36.0)
MCV: 99.8 fL (ref 80.0–100.0)
Monocytes Absolute: 1 10*3/uL (ref 0.1–1.0)
Monocytes Relative: 12 %
Neutro Abs: 6.4 10*3/uL (ref 1.7–7.7)
Neutrophils Relative %: 80 %
Platelets: 167 10*3/uL (ref 150–400)
RBC: 4.49 MIL/uL (ref 3.87–5.11)
RDW: 16.4 % — ABNORMAL HIGH (ref 11.5–15.5)
WBC: 7.9 10*3/uL (ref 4.0–10.5)
nRBC: 0 % (ref 0.0–0.2)

## 2023-07-28 LAB — COMPREHENSIVE METABOLIC PANEL
ALT: 53 U/L — ABNORMAL HIGH (ref 0–44)
AST: 46 U/L — ABNORMAL HIGH (ref 15–41)
Albumin: 3.6 g/dL (ref 3.5–5.0)
Alkaline Phosphatase: 129 U/L — ABNORMAL HIGH (ref 38–126)
Anion gap: 15 (ref 5–15)
BUN: 62 mg/dL — ABNORMAL HIGH (ref 8–23)
CO2: 18 mmol/L — ABNORMAL LOW (ref 22–32)
Calcium: 8.8 mg/dL — ABNORMAL LOW (ref 8.9–10.3)
Chloride: 102 mmol/L (ref 98–111)
Creatinine, Ser: 1.67 mg/dL — ABNORMAL HIGH (ref 0.44–1.00)
GFR, Estimated: 29 mL/min — ABNORMAL LOW (ref >60)
Glucose, Bld: 125 mg/dL — ABNORMAL HIGH (ref 70–99)
Potassium: 5 mmol/L (ref 3.5–5.1)
Sodium: 135 mmol/L (ref 135–145)
Total Bilirubin: 1.1 mg/dL (ref 0.0–1.2)
Total Protein: 7.6 g/dL (ref 6.5–8.1)

## 2023-07-28 LAB — PROTIME-INR
INR: 2.2 — ABNORMAL HIGH (ref 0.8–1.2)
Prothrombin Time: 24.6 s — ABNORMAL HIGH (ref 11.4–15.2)

## 2023-07-28 LAB — LIPASE, BLOOD: Lipase: 26 U/L (ref 11–51)

## 2023-07-28 MED ORDER — SODIUM CHLORIDE 0.9 % IV BOLUS
1000.0000 mL | Freq: Once | INTRAVENOUS | Status: AC
  Administered 2023-07-28: 1000 mL via INTRAVENOUS

## 2023-07-28 MED ORDER — KETOROLAC TROMETHAMINE 15 MG/ML IJ SOLN
15.0000 mg | Freq: Once | INTRAMUSCULAR | Status: AC
Start: 2023-07-28 — End: 2023-07-28
  Administered 2023-07-28: 15 mg via INTRAVENOUS
  Filled 2023-07-28: qty 1

## 2023-07-28 MED ORDER — OXYCODONE HCL 5 MG PO TABS
5.0000 mg | ORAL_TABLET | Freq: Once | ORAL | Status: AC
  Administered 2023-07-28: 5 mg via ORAL
  Filled 2023-07-28: qty 1

## 2023-07-28 MED ORDER — ONDANSETRON HCL 4 MG/2ML IJ SOLN
4.0000 mg | Freq: Once | INTRAMUSCULAR | Status: AC
  Administered 2023-07-28: 4 mg via INTRAVENOUS
  Filled 2023-07-28 (×2): qty 2

## 2023-07-28 MED ORDER — ONDANSETRON 4 MG PO TBDP
ORAL_TABLET | ORAL | 0 refills | Status: AC
Start: 2023-07-28 — End: ?

## 2023-07-28 MED ORDER — ACETAMINOPHEN 500 MG PO TABS
1000.0000 mg | ORAL_TABLET | Freq: Once | ORAL | Status: AC
  Administered 2023-07-28: 1000 mg via ORAL
  Filled 2023-07-28: qty 2

## 2023-07-28 NOTE — ED Provider Notes (Signed)
 [facility] Provider Note   CSN: [id number] Arrival date & time: 07/28/23  0546     History  Chief Complaint  Patient presents with   Hip Pain    Left hip pain that started approx 2 weeks ago. Pt denies trauma, but family states that hip has been a problem in the past- pin placed per daughter approx a couple years ago. Pt also c/o nausea that started this AM.     Tina Wade is a 89 y.o. female.  89 yo F with a cc of left hip pain.  Going on for a couple weeks.  Has seen ortho, plain films, hip injection.  Has not improved symptoms.  Denies trauma.  Has had some vomiting with it.  No fevers.     Hip Pain       Home Medications Prior to Admission medications   Medication Sig Start Date End Date Taking? Authorizing Provider  ondansetron  (ZOFRAN -ODT) 4 MG disintegrating tablet 4mg  ODT q4 hours prn nausea/vomit 07/28/23  Yes Aviendha Azbell, DO  albuterol  (VENTOLIN  HFA) 108 (90 Base) MCG/ACT inhaler Inhale 2 puffs into the lungs as needed. 10/04/18   [provider]  calcium  carbonate (OS-CAL) 600 MG TABS tablet Take 600 mg by mouth daily with breakfast.     [provider]  ferrous sulfate  325 (65 FE) MG tablet Take 325 mg by mouth daily at 12 noon.    [provider]  furosemide  (LASIX ) 40 MG tablet Take 40 mg by mouth daily.     [provider]  levothyroxine  (SYNTHROID ) 112 MCG tablet Take 112 mcg by mouth daily at 12 noon. 12 noon    [provider]  losartan  (COZAAR ) 25 MG tablet Take 1 tablet (25 mg total) by mouth daily. 02/27/19   Lue Elsie JAYSON, MD  montelukast  (SINGULAIR ) 10 MG tablet Take 10 mg by mouth at bedtime.    [provider]  Multiple Vitamin (MULTIVITAMIN WITH MINERALS) TABS tablet Take 1 tablet by mouth daily.    [provider]  PE-DM-GG-APAP&PE-Doxyl-DM-APAP (TYLENOL  COLD & FLU DAY/NIGHT) LQPK Take by mouth as needed. Cup that came with the  medication no more than four times a day    [provider]  rosuvastatin  (CRESTOR ) 10 MG tablet Take 10 mg by mouth at bedtime.  02/14/18   [provider]  Tiotropium Bromide-Olodaterol (STIOLTO RESPIMAT) 2.5-2.5 MCG/ACT AERS Inhale 2 puffs into the lungs daily.  06/17/18   [provider]  triamcinolone cream (KENALOG) 0.1 % Apply 1 application topically as needed for rash. 02/07/19   [provider]  warfarin (COUMADIN ) 5 MG tablet TAKE ONE TO ONE AND ONE-HALF TABLETS DAILY OR AS DIRECTED Patient taking differently: Take 2.5-5 mg by mouth See admin instructions. Take 2.5 mg Mon. Wed and Friday Take 5 mg all the other day in the evening    Alvstad, Kristin L, RPH-CPP      Allergies    Patient has no known allergies.    Review of Systems   Review of Systems  Physical Exam Updated Vital Signs BP 138/82   Pulse 79   Temp 97.8 F (36.6 C) (Oral)   Resp 16   Ht 5' 6 (1.676 m)   Wt 79.8 kg   SpO2 92%   BMI 28.41 kg/m  Physical Exam Vitals and nursing note reviewed.  Constitutional:      General: She is not in acute distress.    Appearance:  She is well-developed. She is not diaphoretic.  HENT:     Head: Normocephalic and atraumatic.  Eyes:     Pupils: Pupils are equal, round, and reactive to light.  Cardiovascular:     Rate and Rhythm: Normal rate and regular rhythm.     Heart sounds: No murmur heard.    No friction rub. No gallop.  Pulmonary:     Effort: Pulmonary effort is normal.     Breath sounds: No wheezing or rales.  Abdominal:     General: There is no distension.     Palpations: Abdomen is soft.     Tenderness: There is abdominal tenderness.     Comments: Pain with palpation to the lower abdomen.   Musculoskeletal:        General: No tenderness.     Cervical back: Normal range of motion and neck supple.     Comments: PMS intact to the LLE.  No obvious pain with internal and external rotation of the left lower extremity.  Skin:     General: Skin is warm and dry.  Neurological:     Mental Status: She is alert and oriented to person, place, and time.  Psychiatric:        Behavior: Behavior normal.     ED Results / Procedures / Treatments   Labs (all labs ordered are listed, but only abnormal results are displayed) Labs Reviewed  CBC WITH DIFFERENTIAL/PLATELET - Abnormal; Notable for the following components:      Result Value   RDW 16.4 (*)    Lymphs Abs 0.5 (*)    All other components within normal limits  COMPREHENSIVE METABOLIC PANEL - Abnormal; Notable for the following components:   CO2 18 (*)    Glucose, Bld 125 (*)    BUN 62 (*)    Creatinine, Ser 1.67 (*)    Calcium  8.8 (*)    AST 46 (*)    ALT 53 (*)    Alkaline Phosphatase 129 (*)    GFR, Estimated 29 (*)    All other components within normal limits  PROTIME-INR - Abnormal; Notable for the following components:   Prothrombin Time 24.6 (*)    INR 2.2 (*)    All other components within normal limits  LIPASE, BLOOD  URINALYSIS, ROUTINE W REFLEX MICROSCOPIC    EKG None  Radiology CT L-SPINE NO CHARGE Result Date: 07/28/2023 CLINICAL DATA:  Flank pain and back pain. EXAM: CT LUMBAR SPINE WITHOUT CONTRAST TECHNIQUE: Multidetector CT imaging of the lumbar spine was performed without intravenous contrast administration. Multiplanar CT image reconstructions were also generated. RADIATION DOSE REDUCTION: This exam was performed according to the departmental dose-optimization program which includes automated exposure control, adjustment of the mA and/or kV according to patient size and/or use of iterative reconstruction technique. COMPARISON:  None Available. FINDINGS: Segmentation: Standard. Alignment: There is mild dextrorotary lumbar scoliosis, apex at L3. There is a trace L1-2 and L2-3 retrolisthesis both likely due to discogenic degenerative arthrosis. No traumatic or further listhesis is seen. Vertebrae: Osteopenia. There is mild anterior wedging  of the L1 vertebral body with a chronic appearance. The other vertebrae are normal in heights and do not show evidence of fractures. There is moderate marginal osteophytosis but no bulky bridging osteophytes. Endplate Schmorl's nodes L1-2, L2-3 and L3-4. No concerning regional bone lesion. There are dorsally bridging osteophytes both SI joints, bilateral SI joint spurring. No sacral insufficiency fracture is seen. Paraspinal and other soft tissues: Abdominal aortic and branch vessel  atherosclerosis. No acute findings. Incidentally seen, there is a 1.7 cm left adrenal adenoma, Hounsfield density is -9. No follow-up imaging is recommended. Disc levels: There is disc space loss and vacuum phenomenon at all lumbar levels from T12 through L4-5. Only L5-S1 remains normal in height. T12-L1: No herniation or stenosis.  Trace facet spurring. L1-2: Mild posterior disc osteophyte complex. No herniation or stenosis. Slight facet spurring. L2-3: Small posterior osteophytes. No herniation or canal stenosis. There are mild facet spurs. There is moderate bilateral foraminal stenosis. L3-4: Moderate acquired spinal stenosis due to circumferential disc osteophyte complex, ligamentous and mild-to-moderate facet hypertrophy with lateral recess encroachment. Likely mass effect on the descending L4 nerve roots. Foramina are mildly stenotic. L4-5: No herniation or canal stenosis. Right greater than left facet hypertrophy. There is severe right and mild-to-moderate left foraminal stenosis. L5-S1: The disc is dorsally intact. Right-greater-than-left facet hypertrophy. There is unilateral mild-to-moderate right foraminal stenosis. IMPRESSION: 1. Osteopenia and degenerative change without evidence of lumbar fractures. 2. Mild anterior wedging of the L1 vertebral body with a chronic appearance. 3. Mild dextrorotary lumbar scoliosis with trace L1-2 and L2-3 retrolisthesis both likely due to discogenic degenerative arthrosis. 4. Moderate  acquired spinal stenosis at L3-4 with lateral recess encroachment and likely mass effect on the descending L4 nerve roots. 5. Multilevel foraminal stenosis, most severe on the right at L4-5. 6. 1.7 cm left adrenal adenoma.  No follow-up imaging recommended. 7. Aortic and branch vessel atherosclerosis. Aortic Atherosclerosis (ICD10-I70.0). Electronically Signed   By: Francis Quam M.D.   On: 07/28/2023 07:49   CT Renal Stone Study Result Date: 07/28/2023 CLINICAL DATA:  Abdominal/flank pain and low back pain. EXAM: CT ABDOMEN AND PELVIS WITHOUT CONTRAST TECHNIQUE: Multidetector CT imaging of the abdomen and pelvis was performed following the standard protocol without IV contrast. RADIATION DOSE REDUCTION: This exam was performed according to the departmental dose-optimization program which includes automated exposure control, adjustment of the mA and/or kV according to patient size and/or use of iterative reconstruction technique. COMPARISON:  None Available. FINDINGS: Lower chest: There is mild cardiomegaly. Calcification in the right coronary artery. There are sternotomy sutures and a partially visible prosthetic mitral valve. Calcification distal descending aorta. There are scattered linear scar-like opacities in the lung bases. There is a 6 mm noncalcified right lower lobe nodule on 5:10. Lung bases are clear of infiltrates.  No pleural effusion. Hepatobiliary: No focal liver abnormality is seen without contrast, but there is a slightly nodular contour to the hepatic surface which is compatible with cirrhosis. Clinical correlation advised. Status post cholecystectomy. The common bile duct is prominent measuring 11 mm with no visible filling defect. Pancreas: Partially atrophic. Otherwise unremarkable without contrast. Spleen: Unremarkable without contrast.  No splenomegaly. Adrenals/Urinary Tract: There is asymmetric cortical volume loss of the left kidney relative to the right probably on a renovascular  basis. There are renovascular calcifications at both renal hila, a 3 mm nonobstructing caliceal stone in the superior pole right kidney. There is no contour deforming mass of either of the unenhanced kidneys. There is no ureteral stone or urinary obstruction. The bladder is unremarkable. Stomach/Bowel: No dilatation or wall thickening. Normal appendix. Diffuse colonic diverticulosis without focal diverticulitis. Vascular/Lymphatic: Heavy aortoiliac and moderate visceral branch vessel atherosclerosis. No AAA. No adenopathy. Reproductive: Uterus and bilateral adnexa are unremarkable. Other: Small umbilical fat hernia. No incarcerated hernia. No free fluid, hemorrhage or free air. Musculoskeletal: Osteopenia and degenerative change with asymmetric left hip DJD and 3 threaded intact left hip pins.  Mild lumbar dextroscoliosis. No acute or other significant osseous findings. Bridging osteophytes posterior left SI joint. IMPRESSION: 1. No acute noncontrast CT findings in the abdomen or pelvis. 2. 3 mm nonobstructing caliceal stone superior pole right kidney. No ureteral stone or urinary obstruction. 3. Asymmetric cortical volume loss of the left kidney probably on a renovascular basis. 4. Cardiomegaly with aortic and coronary artery atherosclerosis. 5. 6 mm noncalcified right lower lobe nodule. Non-contrast chest CT at 6-12 months is recommended if clinically warranted given advanced age. If performed and the nodule is stable at time of repeat CT, then future CT at 18-24 months (from today's scan) is considered optional for low-risk patients, but is recommended for high-risk patients. This recommendation follows the consensus statement: Guidelines for Management of Incidental Pulmonary Nodules Detected on CT Images: From the Fleischner Society 2017; Radiology 2017; 284:228-243. 6. Diverticulosis without evidence of diverticulitis. 7. Prominent common bile duct 11 mm post cholecystectomy. No visible filling defect. 8.  Osteopenia and degenerative change. 9. Umbilical fat hernia. Aortic Atherosclerosis (ICD10-I70.0). Electronically Signed   By: Francis Quam M.D.   On: 07/28/2023 07:35    Procedures Procedures    Medications Ordered in ED Medications  sodium chloride  0.9 % bolus 1,000 mL (1,000 mLs Intravenous New Bag/Given 07/28/23 0740)  ondansetron  (ZOFRAN ) injection 4 mg (4 mg Intravenous Given 07/28/23 0734)  acetaminophen  (TYLENOL ) tablet 1,000 mg (1,000 mg Oral Given 07/28/23 0726)  ketorolac  (TORADOL ) 15 MG/ML injection 15 mg (15 mg Intravenous Given 07/28/23 0735)  oxyCODONE  (Oxy IR/ROXICODONE ) immediate release tablet 5 mg (5 mg Oral Given 07/28/23 9273)    ED Course/ Medical Decision Making/ A&P                                 Medical Decision Making Amount and/or Complexity of Data Reviewed Labs: ordered. Radiology: ordered.  Risk OTC drugs. Prescription drug management.   89 yo F with a chief complaint of left hip pain.  This has been going on for a couple weeks though on my record review the patient is actually been seen for this in the past has had CT imaging of her pelvis previously.  She was seen by orthopedics as well and had a plain film that was unremarkable and then had a joint injection.  She does not feel like it is getting any better and she actually felt he got worse overnight.  Has had some vomiting with this as well.  She does have some abdominal discomfort for me on exam.  Will obtain CT imaging to assess for intra-abdominal or intraspinal pathology.  Treat pain and nausea.  Reassess.  Patient feeling much better on repeat assessment.    INR at the low end of her limit.  LFTs and lipase are unremarkable.  No acute anemia.  I discussed the patient's imaging and blood work with her.  Her renal function has gotten worse over the past 6 months it appears.  Last check in December it was 1.3.  It looks like her baseline is right about 1.  Her BUN is also up which I think is  consistent with acute kidney injury.  I did offer admission which she is declining at this time.  She also is deferring her urine test.  I will have her follow-up with her family doctor in the office.  8:43 AM:  I have discussed the diagnosis/risks/treatment options with the patient.  Evaluation and diagnostic testing  in the emergency department does not suggest an emergent condition requiring admission or immediate intervention beyond what has been performed at this time.  They will follow up with PCP. We also discussed returning to the ED immediately if new or worsening sx occur. We discussed the sx which are most concerning (e.g., sudden worsening pain, fever, inability to tolerate by mouth, cauda equina s/sx) that necessitate immediate return. Medications administered to the patient during their visit and any new prescriptions provided to the patient are listed below.  Medications given during this visit Medications  sodium chloride  0.9 % bolus 1,000 mL (1,000 mLs Intravenous New Bag/Given 07/28/23 0740)  ondansetron  (ZOFRAN ) injection 4 mg (4 mg Intravenous Given 07/28/23 0734)  acetaminophen  (TYLENOL ) tablet 1,000 mg (1,000 mg Oral Given 07/28/23 0726)  ketorolac  (TORADOL ) 15 MG/ML injection 15 mg (15 mg Intravenous Given 07/28/23 0735)  oxyCODONE  (Oxy IR/ROXICODONE ) immediate release tablet 5 mg (5 mg Oral Given 07/28/23 0726)     The patient appears reasonably screen and/or stabilized for discharge and I doubt any other medical condition or other [facility] requiring further screening, evaluation, or treatment in the ED at this time prior to discharge.          Final Clinical Impression(s) / ED Diagnoses Final diagnoses:  Left hip pain  AKI (acute kidney injury) (HCC)    Rx / DC Orders ED Discharge Orders          Ordered    ondansetron  (ZOFRAN -ODT) 4 MG disintegrating tablet        07/28/23 0839              Emil Share, DO 07/28/23 [phone]

## 2023-07-28 NOTE — ED Notes (Signed)
 RN and pt daughter assisted pt to restroom.

## 2023-07-28 NOTE — Discharge Instructions (Signed)
 Your CT scan did not show any obvious acute causes inside the abdomen or spine or hip that is causing your discomfort.  Your kidney function is slightly worse than it was checked in December.  Please eat and drink as well as you can then follow-up with your family doctor in the office.  Please return for any worsening or if you would like to be rechecked.  Your back pain is most likely due to a muscular strain.  There is been a lot of research on back pain, unfortunately the only thing that seems to really help is Tylenol  and ibuprofen.  Relative rest is also important to not lift greater than 10 pounds bending or twisting at the waist.  Please follow-up with your family physician.  The other thing that really seems to benefit patients is physical therapy which your doctor may send you for.  Please return to the emergency department for new numbness or weakness to your arms or legs. Difficulty with urinating or urinating or pooping on yourself.  Also if you cannot feel toilet paper when you wipe or get a fever.   Use the gel as prescribed Take the pain pill as you need.

## 2023-07-28 NOTE — ED Notes (Addendum)
 Patient transported to CT

## 2023-08-01 ENCOUNTER — Encounter (HOSPITAL_COMMUNITY): Payer: Self-pay

## 2023-08-01 ENCOUNTER — Emergency Department (HOSPITAL_COMMUNITY): Payer: Medicare Other

## 2023-08-01 ENCOUNTER — Inpatient Hospital Stay (HOSPITAL_COMMUNITY)
Admission: EM | Admit: 2023-08-01 | Discharge: 2023-08-09 | DRG: 189 | Disposition: A | Discharge status: Skilled Nursing Facility | Payer: Medicare Other | Attending: Infectious Diseases | Admitting: Infectious Diseases

## 2023-08-01 ENCOUNTER — Other Ambulatory Visit: Payer: Self-pay

## 2023-08-01 DIAGNOSIS — I5033 Acute on chronic diastolic (congestive) heart failure: Secondary | ICD-10-CM | POA: Diagnosis present

## 2023-08-01 DIAGNOSIS — E875 Hyperkalemia: Secondary | ICD-10-CM | POA: Diagnosis present

## 2023-08-01 DIAGNOSIS — N390 Urinary tract infection, site not specified: Secondary | ICD-10-CM | POA: Diagnosis present

## 2023-08-01 DIAGNOSIS — Z952 Presence of prosthetic heart valve: Secondary | ICD-10-CM

## 2023-08-01 DIAGNOSIS — M25559 Pain in unspecified hip: Secondary | ICD-10-CM

## 2023-08-01 DIAGNOSIS — L539 Erythematous condition, unspecified: Secondary | ICD-10-CM | POA: Diagnosis not present

## 2023-08-01 DIAGNOSIS — Z79890 Hormone replacement therapy (postmenopausal): Secondary | ICD-10-CM | POA: Diagnosis not present

## 2023-08-01 DIAGNOSIS — Z7901 Long term (current) use of anticoagulants: Secondary | ICD-10-CM | POA: Diagnosis not present

## 2023-08-01 DIAGNOSIS — Z8249 Family history of ischemic heart disease and other diseases of the circulatory system: Secondary | ICD-10-CM | POA: Diagnosis not present

## 2023-08-01 DIAGNOSIS — I509 Heart failure, unspecified: Secondary | ICD-10-CM | POA: Diagnosis not present

## 2023-08-01 DIAGNOSIS — R410 Disorientation, unspecified: Secondary | ICD-10-CM | POA: Insufficient documentation

## 2023-08-01 DIAGNOSIS — E785 Hyperlipidemia, unspecified: Secondary | ICD-10-CM | POA: Diagnosis present

## 2023-08-01 DIAGNOSIS — Z6829 Body mass index (BMI) 29.0-29.9, adult: Secondary | ICD-10-CM

## 2023-08-01 DIAGNOSIS — E871 Hypo-osmolality and hyponatremia: Secondary | ICD-10-CM | POA: Diagnosis present

## 2023-08-01 DIAGNOSIS — D539 Nutritional anemia, unspecified: Secondary | ICD-10-CM | POA: Diagnosis present

## 2023-08-01 DIAGNOSIS — Z515 Encounter for palliative care: Secondary | ICD-10-CM | POA: Diagnosis not present

## 2023-08-01 DIAGNOSIS — E876 Hypokalemia: Secondary | ICD-10-CM | POA: Diagnosis present

## 2023-08-01 DIAGNOSIS — Z66 Do not resuscitate: Secondary | ICD-10-CM | POA: Diagnosis present

## 2023-08-01 DIAGNOSIS — J9601 Acute respiratory failure with hypoxia: Secondary | ICD-10-CM | POA: Diagnosis not present

## 2023-08-01 DIAGNOSIS — F05 Delirium due to known physiological condition: Secondary | ICD-10-CM | POA: Diagnosis not present

## 2023-08-01 DIAGNOSIS — Z79899 Other long term (current) drug therapy: Secondary | ICD-10-CM

## 2023-08-01 DIAGNOSIS — M1610 Unilateral primary osteoarthritis, unspecified hip: Secondary | ICD-10-CM | POA: Diagnosis present

## 2023-08-01 DIAGNOSIS — I4891 Unspecified atrial fibrillation: Secondary | ICD-10-CM | POA: Diagnosis present

## 2023-08-01 DIAGNOSIS — N179 Acute kidney failure, unspecified: Secondary | ICD-10-CM | POA: Diagnosis present

## 2023-08-01 DIAGNOSIS — N189 Chronic kidney disease, unspecified: Secondary | ICD-10-CM | POA: Diagnosis not present

## 2023-08-01 DIAGNOSIS — E039 Hypothyroidism, unspecified: Secondary | ICD-10-CM | POA: Diagnosis present

## 2023-08-01 DIAGNOSIS — R0603 Acute respiratory distress: Secondary | ICD-10-CM | POA: Diagnosis not present

## 2023-08-01 DIAGNOSIS — Z87891 Personal history of nicotine dependence: Secondary | ICD-10-CM | POA: Diagnosis not present

## 2023-08-01 DIAGNOSIS — Z96642 Presence of left artificial hip joint: Secondary | ICD-10-CM | POA: Diagnosis present

## 2023-08-01 DIAGNOSIS — R252 Cramp and spasm: Secondary | ICD-10-CM | POA: Diagnosis not present

## 2023-08-01 DIAGNOSIS — R339 Retention of urine, unspecified: Secondary | ICD-10-CM | POA: Diagnosis not present

## 2023-08-01 DIAGNOSIS — M25552 Pain in left hip: Secondary | ICD-10-CM | POA: Diagnosis not present

## 2023-08-01 DIAGNOSIS — E7800 Pure hypercholesterolemia, unspecified: Secondary | ICD-10-CM | POA: Diagnosis present

## 2023-08-01 DIAGNOSIS — Z7189 Other specified counseling: Secondary | ICD-10-CM | POA: Diagnosis not present

## 2023-08-01 DIAGNOSIS — D649 Anemia, unspecified: Secondary | ICD-10-CM | POA: Diagnosis not present

## 2023-08-01 DIAGNOSIS — K5900 Constipation, unspecified: Secondary | ICD-10-CM | POA: Diagnosis present

## 2023-08-01 DIAGNOSIS — R791 Abnormal coagulation profile: Secondary | ICD-10-CM | POA: Diagnosis present

## 2023-08-01 DIAGNOSIS — R443 Hallucinations, unspecified: Secondary | ICD-10-CM | POA: Diagnosis not present

## 2023-08-01 DIAGNOSIS — Z8673 Personal history of transient ischemic attack (TIA), and cerebral infarction without residual deficits: Secondary | ICD-10-CM

## 2023-08-01 DIAGNOSIS — I130 Hypertensive heart and chronic kidney disease with heart failure and stage 1 through stage 4 chronic kidney disease, or unspecified chronic kidney disease: Secondary | ICD-10-CM | POA: Diagnosis present

## 2023-08-01 DIAGNOSIS — R350 Frequency of micturition: Secondary | ICD-10-CM | POA: Diagnosis present

## 2023-08-01 DIAGNOSIS — Z7401 Bed confinement status: Secondary | ICD-10-CM | POA: Diagnosis not present

## 2023-08-01 DIAGNOSIS — R0602 Shortness of breath: Secondary | ICD-10-CM | POA: Diagnosis present

## 2023-08-01 DIAGNOSIS — D509 Iron deficiency anemia, unspecified: Secondary | ICD-10-CM | POA: Diagnosis present

## 2023-08-01 DIAGNOSIS — E440 Moderate protein-calorie malnutrition: Secondary | ICD-10-CM | POA: Diagnosis present

## 2023-08-01 DIAGNOSIS — R627 Adult failure to thrive: Secondary | ICD-10-CM

## 2023-08-01 DIAGNOSIS — J441 Chronic obstructive pulmonary disease with (acute) exacerbation: Secondary | ICD-10-CM | POA: Diagnosis present

## 2023-08-01 DIAGNOSIS — Z8701 Personal history of pneumonia (recurrent): Secondary | ICD-10-CM

## 2023-08-01 LAB — COMPREHENSIVE METABOLIC PANEL
ALT: 32 U/L (ref 0–44)
AST: 39 U/L (ref 15–41)
Albumin: 2.9 g/dL — ABNORMAL LOW (ref 3.5–5.0)
Alkaline Phosphatase: 67 U/L (ref 38–126)
Anion gap: 10 (ref 5–15)
BUN: 126 mg/dL — ABNORMAL HIGH (ref 8–23)
CO2: 18 mmol/L — ABNORMAL LOW (ref 22–32)
Calcium: 8.5 mg/dL — ABNORMAL LOW (ref 8.9–10.3)
Chloride: 99 mmol/L (ref 98–111)
Creatinine, Ser: 2.12 mg/dL — ABNORMAL HIGH (ref 0.44–1.00)
GFR, Estimated: 21 mL/min — ABNORMAL LOW (ref >60)
Glucose, Bld: 144 mg/dL — ABNORMAL HIGH (ref 70–99)
Potassium: 5.5 mmol/L — ABNORMAL HIGH (ref 3.5–5.1)
Sodium: 127 mmol/L — ABNORMAL LOW (ref 135–145)
Total Bilirubin: 0.8 mg/dL (ref 0.0–1.2)
Total Protein: 6.2 g/dL — ABNORMAL LOW (ref 6.5–8.1)

## 2023-08-01 LAB — RESP PANEL BY RT-PCR (RSV, FLU A&B, COVID)  RVPGX2
Influenza A by PCR: NEGATIVE
Influenza B by PCR: NEGATIVE
Resp Syncytial Virus by PCR: NEGATIVE
SARS Coronavirus 2 by RT PCR: NEGATIVE

## 2023-08-01 LAB — CBC
HCT: 30.9 % — ABNORMAL LOW (ref 36.0–46.0)
Hemoglobin: 10.4 g/dL — ABNORMAL LOW (ref 12.0–15.0)
MCH: 32.4 pg (ref 26.0–34.0)
MCHC: 33.7 g/dL (ref 30.0–36.0)
MCV: 96.3 fL (ref 80.0–100.0)
Platelets: 192 10*3/uL (ref 150–400)
RBC: 3.21 MIL/uL — ABNORMAL LOW (ref 3.87–5.11)
RDW: 16.1 % — ABNORMAL HIGH (ref 11.5–15.5)
WBC: 15.3 10*3/uL — ABNORMAL HIGH (ref 4.0–10.5)
nRBC: 0.3 % — ABNORMAL HIGH (ref 0.0–0.2)

## 2023-08-01 LAB — PROTIME-INR
INR: 2.7 — ABNORMAL HIGH (ref 0.8–1.2)
Prothrombin Time: 29 s — ABNORMAL HIGH (ref 11.4–15.2)

## 2023-08-01 LAB — BRAIN NATRIURETIC PEPTIDE: B Natriuretic Peptide: 431.1 pg/mL — ABNORMAL HIGH (ref 0.0–100.0)

## 2023-08-01 LAB — TSH: TSH: 2.365 u[IU]/mL (ref 0.350–4.500)

## 2023-08-01 LAB — HEMOGLOBIN A1C
Hgb A1c MFr Bld: 5.5 % (ref 4.8–5.6)
Mean Plasma Glucose: 111.15 mg/dL

## 2023-08-01 LAB — TROPONIN I (HIGH SENSITIVITY)
Troponin I (High Sensitivity): 48 ng/L — ABNORMAL HIGH (ref <18)
Troponin I (High Sensitivity): 44 ng/L — ABNORMAL HIGH (ref <18)

## 2023-08-01 LAB — MAGNESIUM: Magnesium: 2.6 mg/dL — ABNORMAL HIGH (ref 1.7–2.4)

## 2023-08-01 LAB — POC OCCULT BLOOD, ED: Fecal Occult Bld: NEGATIVE

## 2023-08-01 MED ORDER — ACETAMINOPHEN 650 MG RE SUPP: 650.0000 mg | Freq: Four times a day (QID) | RECTAL | Status: DC | PRN

## 2023-08-01 MED ORDER — ACETAMINOPHEN 500 MG PO TABS
1000.0000 mg | ORAL_TABLET | Freq: Three times a day (TID) | ORAL | Status: DC
  Administered 2023-08-01 – 2023-08-02 (×3): 1000 mg via ORAL
  Filled 2023-08-01 (×3): qty 2

## 2023-08-01 MED ORDER — HEPARIN SODIUM (PORCINE) 5000 UNIT/ML IJ SOLN
5000.0000 [IU] | Freq: Three times a day (TID) | INTRAMUSCULAR | Status: DC
  Administered 2023-08-01: 5000 [IU] via SUBCUTANEOUS
  Filled 2023-08-01: qty 1

## 2023-08-01 MED ORDER — SULFAMETHOXAZOLE-TRIMETHOPRIM 800-160 MG PO TABS: 1.0000 | ORAL_TABLET | Freq: Two times a day (BID) | ORAL | Status: DC

## 2023-08-01 MED ORDER — WARFARIN SODIUM 2.5 MG PO TABS
2.5000 mg | ORAL_TABLET | ORAL | Status: DC
Start: 2023-08-01 — End: 2023-08-01

## 2023-08-01 MED ORDER — ALBUTEROL SULFATE (2.5 MG/3ML) 0.083% IN NEBU
2.5000 mg | INHALATION_SOLUTION | Freq: Four times a day (QID) | RESPIRATORY_TRACT | Status: DC
  Administered 2023-08-01 – 2023-08-02 (×4): 2.5 mg via RESPIRATORY_TRACT
  Filled 2023-08-01 (×4): qty 3

## 2023-08-01 MED ORDER — ROSUVASTATIN CALCIUM 20 MG PO TABS
40.0000 mg | ORAL_TABLET | Freq: Every day | ORAL | Status: DC
Start: 2023-08-01 — End: 2023-08-03
  Administered 2023-08-01 – 2023-08-02 (×2): 40 mg via ORAL
  Filled 2023-08-01 (×2): qty 2

## 2023-08-01 MED ORDER — WARFARIN - PHARMACIST DOSING INPATIENT: Freq: Every day | Status: DC

## 2023-08-01 MED ORDER — SENNOSIDES-DOCUSATE SODIUM 8.6-50 MG PO TABS: 1.0000 | ORAL_TABLET | Freq: Every evening | ORAL | Status: DC | PRN

## 2023-08-01 MED ORDER — ACETAMINOPHEN 325 MG PO TABS: 650.0000 mg | ORAL_TABLET | Freq: Four times a day (QID) | ORAL | Status: DC | PRN

## 2023-08-01 MED ORDER — FUROSEMIDE 10 MG/ML IJ SOLN
40.0000 mg | Freq: Once | INTRAMUSCULAR | Status: AC
  Administered 2023-08-01: 40 mg via INTRAVENOUS
  Filled 2023-08-01: qty 4

## 2023-08-01 MED ORDER — SODIUM CHLORIDE 0.9 % IV SOLN
1.0000 g | INTRAVENOUS | Status: DC
  Administered 2023-08-01 – 2023-08-06 (×5): 1 g via INTRAVENOUS
  Filled 2023-08-01 (×5): qty 10

## 2023-08-01 MED ORDER — FUROSEMIDE 10 MG/ML IJ SOLN
40.0000 mg | Freq: Two times a day (BID) | INTRAMUSCULAR | Status: AC
  Administered 2023-08-01 – 2023-08-03 (×4): 40 mg via INTRAVENOUS
  Filled 2023-08-01 (×4): qty 4

## 2023-08-01 MED ORDER — WARFARIN SODIUM 4 MG PO TABS
4.0000 mg | ORAL_TABLET | Freq: Once | ORAL | Status: AC
  Administered 2023-08-01: 4 mg via ORAL
  Filled 2023-08-01: qty 1

## 2023-08-01 MED ORDER — ALBUTEROL SULFATE (2.5 MG/3ML) 0.083% IN NEBU: 2.5000 mL | INHALATION_SOLUTION | Freq: Four times a day (QID) | RESPIRATORY_TRACT | Status: DC | PRN

## 2023-08-01 NOTE — Progress Notes (Signed)
 PHARMACY - ANTICOAGULATION CONSULT NOTE  Pharmacy Consult for Warfarin Indication: atrial fibrillation and mitral valve replacement  Allergies  Allergen Reactions   Neurontin  [Gabapentin ] Other (See Comments)    Weakness    Percocet [Oxycodone -Acetaminophen ] Other (See Comments)    Percocet 5-325mg  dose too strong, OK to take Norco 5-325mg .   Vital Signs: Temp: 98 F (36.7 C) (01/15 1642) Temp Source: Axillary (01/15 1642) BP: 128/41 (01/15 1930) Pulse Rate: 94 (01/15 1930)  Labs: Recent Labs    08/01/23 1239 08/01/23 1600 08/01/23 2015  HGB 10.4*  --   --   HCT 30.9*  --   --   PLT 192  --   --   LABPROT  --   --  29.0*  INR  --   --  2.7*  CREATININE 2.12*  --   --   TROPONINIHS 48* 44*  --     Estimated Creatinine Clearance: 18 mL/min (A) (by C-G formula based on SCr of 2.12 mg/dL (H)).   Medical History: Past Medical History:  Diagnosis Date   Arrhythmia    chronic atrial fib. with controlled rate ,on warfarin therapy   High cholesterol    Hypertension    Lower extremity edema    mild -stable   Mitral valve stenosis 10/1994   s/p [employer] Jude mechanical valve placement -39M-101 MODEL   Stroke (HCC) 08/2010   from office visit 2013- the setting of normal INR of 2.8 ;therefore increased goal to 3.5   Thyroid disease    hypothyroidism on synthroid     Medications:  Scheduled:   acetaminophen   1,000 mg Oral Q8H   albuterol   2.5 mg Nebulization Q6H   furosemide   40 mg Intravenous BID   rosuvastatin   40 mg Oral QHS   Infusions:   Assessment: 89YOF with h/o atrial fibrillation and mechanical mitral valve replacement on warfarin who presented with SOB. PTA warfarin dosing: 2 mg TThSaSun and 4 mg on MWF. Last warfarin dose 1/14. Pharmacy has been consulted to dose warfarin.  Confirmed INR goal with patient as Care Everywhere documentation is not clear.   INR 2.7 (therapeutic). Hgb 10.4, plt 192. Will give usual home dose tonight.  Of note, patient has been  taking Bactrim  PTA for UTI x 5 doses, which has not been restarted inpatient.  Goal of Therapy:  INR 2.5-3.5 Monitor platelets by anticoagulation protocol: Yes   Plan:  Warfarin 4 mg x1 F/u daily INR, CBC, and s/sx of bleeding Can consider decreasing frequency of INR checks if remains stable  Marybelle Smiling 08/01/2023,8:43 PM

## 2023-08-01 NOTE — ED Triage Notes (Signed)
 Pt BIB [facility] from home with [facility] and increased edema. Pt was recently diagnosed with PNA and was started on antibiotics with no improvement. Pt reports increased urination frequency and has generalized pitting edema. Rales present in all lung fields per EMS. SHOB reportedly worse on exertion. Pt has no Hx of diagnosed CHF. Aox4. Per EMS pt was upper 80s% on RA; placed on 12L NRB and went up to 99%. All other VSS.

## 2023-08-01 NOTE — ED Provider Notes (Signed)
 [place] [facility] AT [facility] Provider Note   CSN: [id number] Arrival date & time: 08/01/23  1124     History  Chief Complaint  Patient presents with   Shortness of Breath    Tina Wade is a 89 y.o. female.   Shortness of Breath     Patient has a history of atrial fibrillation hypertension hypercholesterolemia, stroke, hypothyroidism, lower extremity edema, who presents ED with complaints of shortness of breath.  Patient states she has been having trouble with symptoms ongoing for the last week.  Patient states she gets very short of breath whenever she tries to walk or do anything.  Patient was seen in the emergency room on January 11 but at that time it was for extremity pain.  There is no mention of her having any breathing difficulties at that time.  Medical records also indicate a visit with her primary care doctor 2 days ago for follow-up of her ED visit.  There was some mention of dysuria but again no breathing difficulties mentioned Home Medications Prior to Admission medications   Medication Sig Start Date End Date Taking? Authorizing Provider  albuterol  (VENTOLIN  HFA) 108 (90 Base) MCG/ACT inhaler Inhale 2 puffs into the lungs as needed. 10/04/18   [provider]  calcium  carbonate (OS-CAL) 600 MG TABS tablet Take 600 mg by mouth daily with breakfast.     [provider]  ferrous sulfate  325 (65 FE) MG tablet Take 325 mg by mouth daily at 12 noon.    [provider]  furosemide  (LASIX ) 40 MG tablet Take 40 mg by mouth daily.     [provider]  levothyroxine  (SYNTHROID ) 112 MCG tablet Take 112 mcg by mouth daily at 12 noon. 12 noon    [provider]  losartan  (COZAAR ) 25 MG tablet Take 1 tablet (25 mg total) by mouth daily. 02/27/19   Haydee Lipa, MD  montelukast  (SINGULAIR ) 10 MG tablet Take 10 mg by mouth at bedtime.    [provider]  Multiple Vitamin (MULTIVITAMIN WITH  MINERALS) TABS tablet Take 1 tablet by mouth daily.    [provider]  ondansetron  (ZOFRAN -ODT) 4 MG disintegrating tablet 4mg  ODT q4 hours prn nausea/vomit 07/28/23   Floyd, Dan, DO  PE-DM-GG-APAP&PE-Doxyl-DM-APAP (TYLENOL  COLD & FLU DAY/NIGHT) LQPK Take by mouth as needed. Cup that came with the medication no more than four times a day    [provider]  rosuvastatin  (CRESTOR ) 10 MG tablet Take 10 mg by mouth at bedtime.  02/14/18   [provider]  Tiotropium Bromide-Olodaterol (STIOLTO RESPIMAT) 2.5-2.5 MCG/ACT AERS Inhale 2 puffs into the lungs daily.  06/17/18   [provider]  triamcinolone cream (KENALOG) 0.1 % Apply 1 application topically as needed for rash. 02/07/19   [provider]  warfarin (COUMADIN ) 5 MG tablet TAKE ONE TO ONE AND ONE-HALF TABLETS DAILY OR AS DIRECTED Patient taking differently: Take 2.5-5 mg by mouth See admin instructions. Take 2.5 mg Mon. Wed and Friday Take 5 mg all the other day in the evening    Alvstad, Kristin L, RPH-CPP      Allergies    Patient has no known allergies.    Review of Systems   Review of Systems  Respiratory:  Positive for shortness of breath.     Physical Exam Updated Vital Signs BP (!) 140/58 (BP Location: Right Arm)   Pulse 86   Temp 97.6 F (36.4 C) (Axillary)   Resp Aaron Aas)  24   SpO2 100%  Physical Exam Vitals and nursing note reviewed.  Constitutional:      Appearance: She is well-developed. She is ill-appearing. She is not diaphoretic.  HENT:     Head: Normocephalic and atraumatic.     Right Ear: External ear normal.     Left Ear: External ear normal.  Eyes:     General: No scleral icterus.       Right eye: No discharge.        Left eye: No discharge.     Conjunctiva/sclera: Conjunctivae normal.  Neck:     Trachea: No tracheal deviation.  Cardiovascular:     Rate and Rhythm: Normal rate and regular rhythm.  Pulmonary:     Effort: Pulmonary effort is normal. No respiratory  distress.     Breath sounds: No stridor. Examination of the right-upper field reveals rales. Examination of the left-upper field reveals rales. Examination of the right-middle field reveals rales. Examination of the left-middle field reveals rales. Examination of the right-lower field reveals rales. Examination of the left-lower field reveals rales. Rales present. No wheezing.  Abdominal:     General: Bowel sounds are normal. There is no distension.     Palpations: Abdomen is soft.     Tenderness: There is no abdominal tenderness. There is no guarding or rebound.  Musculoskeletal:        General: No tenderness or deformity.     Cervical back: Neck supple.     Right lower leg: Edema present.     Left lower leg: Edema present.  Skin:    General: Skin is warm and dry.     Findings: No rash.  Neurological:     General: No focal deficit present.     Mental Status: She is alert.     Cranial Nerves: No cranial nerve deficit, dysarthria or facial asymmetry.     Sensory: No sensory deficit.     Motor: No abnormal muscle tone or seizure activity.     Coordination: Coordination normal.  Psychiatric:        Mood and Affect: Mood normal.     ED Results / Procedures / Treatments   Labs (all labs ordered are listed, but only abnormal results are displayed) Labs Reviewed  MAGNESIUM - Abnormal; Notable for the following components:      Result Value   Magnesium 2.6 (*)    All other components within normal limits  BRAIN NATRIURETIC PEPTIDE - Abnormal; Notable for the following components:   B Natriuretic Peptide 431.1 (*)    All other components within normal limits  CBC - Abnormal; Notable for the following components:   WBC 15.3 (*)    RBC 3.21 (*)    Hemoglobin 10.4 (*)    HCT 30.9 (*)    RDW 16.1 (*)    nRBC 0.3 (*)    All other components within normal limits  COMPREHENSIVE METABOLIC PANEL - Abnormal; Notable for the following components:   Sodium 127 (*)    Potassium 5.5 (*)     CO2 18 (*)    Glucose, Bld 144 (*)    BUN 126 (*)    Creatinine, Ser 2.12 (*)    Calcium  8.5 (*)    Total Protein 6.2 (*)    Albumin 2.9 (*)    GFR, Estimated 21 (*)    All other components within normal limits  TROPONIN I (HIGH SENSITIVITY) - Abnormal; Notable for the following components:   Troponin I (High Sensitivity)  48 (*)    All other components within normal limits  RESP PANEL BY RT-PCR (RSV, FLU A&B, COVID)  RVPGX2  POC OCCULT BLOOD, ED  TROPONIN I (HIGH SENSITIVITY)    EKG EKG Interpretation Date/Time:  Wednesday August 01 2023 11:50:24 EST Ventricular Rate:  87 PR Interval:    QRS Duration:  101 QT Interval:  350 QTC Calculation: 421 R Axis:   -6  Text Interpretation: Atrial fibrillation No significant change since last tracing Confirmed by Trish Furl [phone]) on 08/01/2023 11:52:15 AM  Radiology DG Chest Portable 1 View Result Date: 08/01/2023 CLINICAL DATA:  Shortness of breath.  Cough. EXAM: PORTABLE CHEST 1 VIEW COMPARISON:  02/22/2019. FINDINGS: Low lung volume. There are increased interstitial markings, which is nonspecific and may be due to underlying chronic interstitial lung disease versus atypical pneumonia. No frank pulmonary edema. Bilateral lung fields are clear. Bilateral costophrenic angles are clear. Stable cardio-mediastinal silhouette. There are surgical staples along the heart border and sternotomy wires, status post CABG (coronary artery bypass graft). No acute osseous abnormalities. The soft tissues are within normal limits. IMPRESSION: *Increased interstitial markings, as described above. Electronically Signed   By: Beula Brunswick M.D.   On: 08/01/2023 13:40    Procedures Procedures    Medications Ordered in ED Medications  furosemide  (LASIX ) injection 40 mg (has no administration in time range)    ED Course/ Medical Decision Making/ A&P Clinical Course as of 08/01/23 1616  Wed Aug 01, 2023  1403 Comprehensive metabolic  panel(!) Creatinine shows hyponatremia and worsening renal functioning with elevated BUN [JK]  1403 Troponin I (High Sensitivity)(!) Troponin elevated [JK]  1404 BNP increased compared to previous.  White blood cell count elevated [JK]  1404 CBC(!) Hemoglobin decreased compared to 4 days ago [JK]  1404 Chest x-ray shows increased interstitial markings [JK]  1419 Hemoccult performed.  Stool brown in color [JK]  1450 Case discussed with medical service regarding admission [JK]    Clinical Course User Index [JK] Trish Furl, MD                                 Medical Decision Making Problems Addressed: Acute on chronic congestive heart failure, unspecified heart failure type [facility]): acute illness or injury that poses a threat to life or bodily functions Hip pain, unspecified laterality: acute illness or injury that poses a threat to life or bodily functions  Amount and/or Complexity of Data Reviewed Labs: ordered. Decision-making details documented in ED Course. Radiology: ordered and independent interpretation performed.  Risk Prescription drug management. Decision regarding hospitalization.   Patient presented to the ED for evaluation of shortness of breath.  Patient noted to have rales bilaterally.  She also has pitting edema in her lower extremities.  Patient's dyspnea seems to be related to CHF exacerbation.  She has elevated BNP as well as slightly elevated troponin.  Chest x-ray does suggest vascular congestion.  Patient's labs also notable for worsening renal function.  BUN and creatinine are increased compared to previous.  Hemoglobin also dropped compared to most recent value.  Hemoccult was performed.  Stool brown in color.  I have ordered a dose of diuretics.  I will consult the medical service for admission        Final Clinical Impression(s) / ED Diagnoses Final diagnoses:  Acute on chronic congestive heart failure, unspecified heart failure type (HCC)  Hip pain,  unspecified laterality  Chronic kidney disease,  unspecified CKD stage    Rx / DC Orders ED Discharge Orders     None         Trish Furl, MD 08/01/23 1616

## 2023-08-01 NOTE — H&P (Addendum)
Date: 08/01/2023               Patient Name:  Tina Wade MRN: [id number]  DOB: December 31, 1928 Age / Sex: 89 y.o., female   PCP: Tracey Harries, MD              Medical Service: Internal Medicine Teaching Service              Attending Physician: Dr. Leighton Roach, MD    First Contact: Filomena Jungling, MS 3 Pager: [phone]  Second Contact: Dr. Carmina Miller, DO Pager: [phone]  Third Contact Dr. Gwenevere Abbot, MD Pager: [phone]       After Hours (After 5p/  First Contact Pager: [phone]  weekends / holidays): Second Contact Pager: [phone]   Chief Complaint: Could not get up from chair and breathing changes  History of Present Illness: DEVINN STEENBERG is a 89 y.o. female with a 71-pack year history and past medical history significant for mitral valve replacement and prior L hip surgery presenting with difficulty getting up from her chair today and SOB.  Patient interviewed with her son. He and patient provided the following history. Reports patient was in her chair at home today and due to pain and leg weakness, she was unable to get out of her chair. Her son reports he called EMS since she was unable to get up. When EMS arrived, he said they assessed her and recommended she come to the ED given her respiratory status.   Patient's son reports patient was hospitalized for pneumonia about 2-3 weeks ago. He said since then she has seen 4-5 doctors for hip pain. They report she had a prior surgery on her L hip. Additionally, over the past 2 weeks, patient reports she has had increased urinary frequency (about every 30 minutes per son). She denies pain with urination, burning or blood in her urine. She denies mid back pain or having UTIs often in the past. Over the past 3 days, patient and her son mention that her breathing has changed where they can hear "gurgling." Patient states she is not having difficulty breathing on exam today; however, she does endorse shortness of breath when lying  flat, but denies waking up in the middle of the night due to difficulty breathing. Son reports she sleeps in a recliner chair with her head elevated. She also endorses leg swelling that started about 2-3 days ago and that she has not has leg swelling this bad before. Patient denies feeling thirsty or eating a lot of salty foods. Denies CP and changes with bowel movements and endorses 2 episode of vomiting a couple days ago that she thinks was related to food.    Current Outpatient Medications  Medication Instructions   albuterol (VENTOLIN HFA) 108 (90 Base) MCG/ACT inhaler 2 puffs, Inhalation, As needed   calcium carbonate (OS-CAL) 600 mg, Oral, Daily with breakfast   diclofenac Sodium (VOLTAREN) 1 % GEL Topical, 4 times daily PRN   ferrous sulfate 325 mg, Oral, Daily   furosemide (LASIX) 40 mg, Oral, Daily   gabapentin (NEURONTIN) 100 mg, Oral, 2 times daily   levETIRAcetam (KEPPRA XR) 750 mg, Oral, 2 times daily   levothyroxine (SYNTHROID) 112 mcg, Oral, Daily, 12 noon   losartan (COZAAR) 25 mg, Oral, Daily   montelukast (SINGULAIR) 10 mg, Oral, Daily at bedtime   Multiple Vitamin (MULTIVITAMIN WITH MINERALS) TABS tablet 1 tablet, Oral, Daily   ondansetron (ZOFRAN-ODT) 4 MG disintegrating tablet 4mg  ODT q4 hours prn  nausea/vomit   oxyCODONE-acetaminophen (PERCOCET/ROXICET) 5-325 MG tablet 1 tablet, Oral, Every 6 hours PRN   PE-DM-GG-APAP&PE-Doxyl-DM-APAP (TYLENOL COLD & FLU DAY/NIGHT) LQPK Oral, As needed, Cup that came with the medication no more than four times a day   potassium chloride SA (KLOR-CON M) 20 MEQ tablet 20 mEq, Oral, 2 times daily   rosuvastatin (CRESTOR) 10 mg, Oral, Daily at bedtime   spironolactone (ALDACTONE) 25 mg, Oral, 2 times daily   sulfamethoxazole-trimethoprim (BACTRIM DS) 800-160 MG tablet 1 tablet, Oral, 2 times daily   Tiotropium Bromide-Olodaterol (STIOLTO RESPIMAT) 2.5-2.5 MCG/ACT AERS 2 puffs, Inhalation, Daily   triamcinolone cream (KENALOG) 0.1 % 1  application , Topical, As needed   warfarin (COUMADIN) 5 MG tablet TAKE ONE TO ONE AND ONE-HALF TABLETS DAILY OR AS DIRECTED    Allergies: Allergies as of 08/01/2023   (No Known Allergies)   Past Medical History:  Diagnosis Date   Arrhythmia    chronic atrial fib. with controlled rate ,on warfarin therapy   High cholesterol    Hypertension    Lower extremity edema    mild -stable   Mitral valve stenosis 10/1994   s/p [employer] Jude mechanical valve placement -76M-101 MODEL   Stroke (HCC) 08/2010   from office visit 2013- the setting of normal INR of 2.8 ;therefore increased goal to 3.5   Thyroid disease    hypothyroidism on synthroid  Patient denies heart disease other than the heart valve replacement, lung disease, liver disease, DM or hx of stroke.   Family History  Problem Relation Age of Onset   Heart disease Mother    Tuberculosis Father     Social History: Patient reports starting smoking at age 61 and quitting 2 weeks ago with an average of about 1 pack per day (71 pack-year history). She reports last alcohol use was 6 months ago. Patient's son mentioned she would drink on the weekends prior to stopping 6 months ago. Denies other drug use.   PCP: Dr. Onalee Hua, [place] Other  Review of Systems: A complete ROS was negative except as per HPI.   Physical Exam: Blood pressure (!) 147/48, pulse 93, temperature 98 F (36.7 C), temperature source Axillary, resp. rate (!) 28, SpO2 100%. General: NAD, pleasant, cooperative Cardiovascular: Irregularly irregular rhythm, normal rate, systolic murmur, JVD present Respiratory: normal work of breathing, rhonchi and crackles present diffusely bilaterally Abdominal: soft, non-tender, normal work of bowel sounds Extremities: pitting edema present on bilateral lower extremities, R calf larger than L, negative calf squeeze on L, slight pain elicited with R calf squeeze, bilateral dorsalis pedal pulses appreciated via doppler     Latest Ref Rng & Units  08/01/2023   12:39 PM 07/28/2023    7:41 AM 02/27/2019    3:52 AM  CBC  WBC 4.0 - 10.5 K/uL 15.3  7.9  6.0   Hemoglobin 12.0 - 15.0 g/dL 16.1  09.6  04.5   Hematocrit 36.0 - 46.0 % 30.9  44.8  34.9   Platelets 150 - 400 K/uL 192  167  162       Latest Ref Rng & Units 08/01/2023   12:39 PM 07/28/2023    7:41 AM 02/27/2019    3:52 AM  CMP  Glucose 70 - 99 mg/dL 409  811  914   BUN 8 - 23 mg/dL 782  62  24   Creatinine 0.44 - 1.00 mg/dL 9.56  2.13  0.86   Sodium 135 - 145 mmol/L 127  135  135  Potassium 3.5 - 5.1 mmol/L 5.5  5.0  4.7   Chloride 98 - 111 mmol/L 99  102  96   CO2 22 - 32 mmol/L 18  18  31    Calcium 8.9 - 10.3 mg/dL 8.5  8.8  9.1   Total Protein 6.5 - 8.1 g/dL 6.2  7.6    Total Bilirubin 0.0 - 1.2 mg/dL 0.8  1.1    Alkaline Phos 38 - 126 U/L 67  129    AST 15 - 41 U/L 39  46    ALT 0 - 44 U/L 32  53     BNP: 431.1 Troponin: 48 -> 44  Respiratory Panel: Negative  TSH: 2.365  EKG: personally reviewed my interpretation is atrial fibrillation   CXR: personally reviewed my interpretation is increased interstitial markings  Assessment & Plan by Problem: Principal Problem:   Acute hypoxic respiratory failure (HCC) Active Problems:   Moderate protein-calorie malnutrition (HCC)   Congestive heart failure (HCC)  MARYSA HIRAYAMA is a 89 y.o. female with a 71-pack year history and past medical history significant for mitral valve replacement and prior L hip surgery presenting with difficulty getting up from her chair today and SOB and admitted to the IMTS for acute hypoxic respiratory failure.   Acute hypoxic respiratory failure Congestive heart failure Patient appears to be volume overloaded with pitting peripheral edema, JVD present and crackles and rhonchi present on pulmonary exam. Patient reports pneumonia about 2-3 weeks ago. Differential diagnosis includes acute hypoxic respiratory failure 2/2 recent pneumonia, COPD, heart failure, PE or ACS. New onset heart  failure most likely given pitting peripheral edema, JVD present and crackles and rhonchi present on pulmonary exam. Patient was hospitalized and treated for prior pneumonia and chest x-ray does not reveal lobar consolidations, so pneumonia less likely. Patient reports her daughter who she lives with was sick about a week ago, but patient is afebrile and respiratory panel came back unremarkable, so infectious etiology less likely. New onset COPD exacerbation possible as patient has an extensive smoking history; however, no wheezing present and no SOB on exam. PE considered as patient was recently hospitalized ~2 weeks prior and now has SOB. R > L calf and some pain elicited when R calf squeezed. Dorsalis pedal pulses appreciated with doppler. ACS considered; however, patient denies CP and EKG does not reveal ischemic changes. Troponin elevated, but stable. Plan to start diuresis given her volume overload and obtain an echo to assess heart function. Will get doppler U/S to assess for blood clot in her legs.  - Start furosemide 40 mg IV BID - Monitor I/O, daily weights - Replete Mag, K with goal of 2 and 4 respectively.  - Telemetry - Echo  - Doppler U/S  L Hip Pain Per chart review, patient has history of percutaneous fixation of L femoral neck fracture in 02/23/2019. She endorses continued pain and reports she is following with an orthopedic physician outpatient. Plan to get imaging to assess her hip with given pain and weakness.  - Hip Xray - PT/OT ordered.   Increased Urinary Frequency Patient endorses increased urinary frequency for the past 2 weeks. Denies burning, pain or blood when urinating. Will get a U/A to assess for UTI. Had dispense of bactrim so will complete its course.    Atrial Fibrillation Mitral Valve Replacement  Per chart review, patient has chronic a fib with intrinsically controlled rate on warfarin. A fib present on exam today. Rate controlled. Patient reports mitral heart valve  replacement about 4 years ago.  - Continue home Warfarin dosing  Hypothyroidism Noted on chart review. TSH WNL. Will continue home levothyroxine  Code: DNR-Limited VTE Prophylaxis: Warfarin Diet: Heart Fluids: N/A  Medical Decision Maker: Patient reports her daughter is her medical decision maker. Son reports they have completed paperwork.  Dispo: Admit patient to Inpatient with expected length of stay greater than 2 midnights.  Signed: Bubba Hales, Medical Student 08/01/2023, 5:30 PM  Pager: [phone]  Attestation for Student Documentation:  I personally was present and performed or re-performed the history, physical exam and medical decision-making activities of this service and have verified that the service and findings are accurately documented in the student's note.  Gwenevere Abbot, MD 08/01/2023, 11:02 PM

## 2023-08-01 NOTE — ED Notes (Signed)
Pt given water, ok by EDP

## 2023-08-01 NOTE — Hospital Course (Addendum)
Principal Problem:   Acute hypoxic respiratory failure (HCC) Active Problems:   H/O mitral valve replacement   HLD (hyperlipidemia)   Hypothyroidism   Moderate protein-calorie malnutrition (HCC)   Congestive heart failure (HCC)   Chronic kidney disease   Normocytic anemia   Supratherapeutic INR   Urinary tract infection   Delirium  Procedures: Echocardiogram  Consults: Palliative Care, PT, OT  Follow Up: - Urinary retention: no retention prior to discharge. Recommend considering follow-up bladder scans and possible medication or foley management   AHRF 2/2 HFpEF exacerbation  Patient presented to the ED with SOB and weakness. Physical exam revealed pitting peripheral edema, JVD, crackles and rhonci on exam. She was admitted to the IMTS due to acute hypoxic respiratory failure secondary to heart failure exacerbation. She was started on 40 mg IV lasix BID. Echo revealed EF 50-55%. She was transitioned to home lasix dose of 40 mg oral BID. Patient's volume status and oxygen saturations stable on RA upon discharge.  Urinary Retention  Day prior to discharge, nursing reported low voiding volume. Bladder scan revealed urinary retention, so in-and-out cath initiated. Patient continued to retained urine overnight with additional need for in-and-out cath. Bladder scan prior to discharge ~170 mL. Suspect this is due to functional incontinence as patient has been bed-bound during admission. Considered nephrogenic bladder, but functional incontinence more likely. Recommend assessing for urinary retention and consideration of medication management or foley catheter if needed.  Reddened Skin on Bilateral Heels   Mild redness on bilateral heels. Mepilex on heels for added support. Patient was q2 hour turns during hospitalization as she was bed-bound. Recommend continued position changes if she continued to be immobile.   Constipation Last documented BM 1/18. Patient started on senna-docusate 1 tablet  and was increased to 2 tablets per day yesterday. No BM reported since increase of senna-docusate.  UTI/Leukocytosis Elevated WBC count on admission. She was started on bactrim outpatient for empiric UTI treatment. We transitioned her antibiotic to Rocephin given patient was hyperkalemic. She ended her last dose of Rocephin on 1/20.   Hx of Mechanical Mitral Valve Replacement/Valvular A-fib Patient has hx of mitral valve replacement and a-fib. Home warfarin started on admission. Her INR elevated during admission, so pharmacy team managed dosing. Her INR was 2.8, within goal range of 2.5-3.5, on discharge.    Macrocytic Anemia Patient found to have iron deficiency anemia as TSAT ratio elevated on 1/17. RPI was 1.2 on 1/17 and IV iron was given on 1/18. MCV increased to macrocytic ranges. Vitamin B12 and folate were WNL. Hbg stable at 8.5 on discharge.   Hospital Delirium Patient exhibited some waxing and waning delirium during hospitalization. She was alert and oriented x3 on discharge.    Hypothyroidism  TSH WNL on admission. Home levothyroxine continued during admission.    Hip Osteoarthritis  History of percutaneous fixation of L femoral neck fracture in 02/23/2019. Followed outpatient with orthopedic physician. Patient resumed home Norco 5-325 mg PRN for pain and started on Voltaren gel.   Hx of COPD Stable during admission. No wheezing appreciated. She was started on duonebs. Home inhalers were held given her decreased mentation. Oxygen saturations within goal between 88-92% on discharge. Home inhalers restarted upon discharge.    HLD Started on home Crestor. Transitioned to Lipitor given concurrent AKI as Lipitor does not have to be renally dosed. Recommend continuing Lipitor with outpatient follow-up.   Hypokalemia  Patient with episodic hypokalemia during admission with potassium repletion. Patient resumed home potassium supplementation. Recommend  continued supplementation as patient  continues to be on lasix.   AKI Patient's creatine elevated during admission. Resolved upon discharge.   Goals of Care  Palliative care consulted and met with family. Referral to palliative has been placed for outpatient support if needed.

## 2023-08-01 NOTE — ED Notes (Signed)
 Attempted to bladder scan but pt is eating meal with a visitor at bedside and requested that this be completed at a later time.

## 2023-08-02 ENCOUNTER — Inpatient Hospital Stay (HOSPITAL_COMMUNITY): Payer: Medicare Other

## 2023-08-02 DIAGNOSIS — R252 Cramp and spasm: Secondary | ICD-10-CM

## 2023-08-02 DIAGNOSIS — N189 Chronic kidney disease, unspecified: Secondary | ICD-10-CM | POA: Diagnosis not present

## 2023-08-02 DIAGNOSIS — N179 Acute kidney failure, unspecified: Secondary | ICD-10-CM

## 2023-08-02 DIAGNOSIS — R0603 Acute respiratory distress: Secondary | ICD-10-CM

## 2023-08-02 DIAGNOSIS — M25552 Pain in left hip: Secondary | ICD-10-CM

## 2023-08-02 DIAGNOSIS — I509 Heart failure, unspecified: Secondary | ICD-10-CM | POA: Diagnosis not present

## 2023-08-02 DIAGNOSIS — J9601 Acute respiratory failure with hypoxia: Secondary | ICD-10-CM | POA: Diagnosis not present

## 2023-08-02 DIAGNOSIS — R350 Frequency of micturition: Secondary | ICD-10-CM

## 2023-08-02 DIAGNOSIS — Z952 Presence of prosthetic heart valve: Secondary | ICD-10-CM

## 2023-08-02 LAB — URINALYSIS, ROUTINE W REFLEX MICROSCOPIC
Bilirubin Urine: NEGATIVE
Glucose, UA: NEGATIVE mg/dL
Ketones, ur: NEGATIVE mg/dL
Nitrite: NEGATIVE
Protein, ur: NEGATIVE mg/dL
Specific Gravity, Urine: 1.011 (ref 1.005–1.030)
pH: 5 (ref 5.0–8.0)

## 2023-08-02 LAB — BASIC METABOLIC PANEL
Anion gap: 10 (ref 5–15)
BUN: 130 mg/dL — ABNORMAL HIGH (ref 8–23)
CO2: 18 mmol/L — ABNORMAL LOW (ref 22–32)
Calcium: 7.9 mg/dL — ABNORMAL LOW (ref 8.9–10.3)
Chloride: 99 mmol/L (ref 98–111)
Creatinine, Ser: 2.36 mg/dL — ABNORMAL HIGH (ref 0.44–1.00)
GFR, Estimated: 19 mL/min — ABNORMAL LOW (ref >60)
Glucose, Bld: 148 mg/dL — ABNORMAL HIGH (ref 70–99)
Potassium: 5 mmol/L (ref 3.5–5.1)
Sodium: 127 mmol/L — ABNORMAL LOW (ref 135–145)

## 2023-08-02 LAB — ECHOCARDIOGRAM COMPLETE
AR max vel: 1.18 cm2
AV Area VTI: 1.14 cm2
AV Area mean vel: 1.12 cm2
AV Mean grad: 22.8 mm[Hg]
AV Peak grad: 42.2 mm[Hg]
Ao pk vel: 3.25 m/s
Height: 66 in
MV VTI: 2.31 cm2
P 1/2 time: 348 ms
S' Lateral: 3.5 cm
Weight: 2816 [oz_av]

## 2023-08-02 LAB — OSMOLALITY, URINE: Osmolality, Ur: 392 mosm/kg (ref 300–900)

## 2023-08-02 LAB — PROTIME-INR
INR: 3.2 — ABNORMAL HIGH (ref 0.8–1.2)
Prothrombin Time: 33 s — ABNORMAL HIGH (ref 11.4–15.2)

## 2023-08-02 LAB — SODIUM, URINE, RANDOM: Sodium, Ur: 35 mmol/L

## 2023-08-02 MED ORDER — ACETAMINOPHEN 500 MG PO TABS
1000.0000 mg | ORAL_TABLET | Freq: Two times a day (BID) | ORAL | Status: DC
  Administered 2023-08-03 – 2023-08-04 (×3): 1000 mg via ORAL
  Filled 2023-08-02 (×3): qty 2

## 2023-08-02 MED ORDER — WARFARIN SODIUM 2.5 MG PO TABS
2.5000 mg | ORAL_TABLET | Freq: Once | ORAL | Status: AC
  Administered 2023-08-02: 2.5 mg via ORAL
  Filled 2023-08-02: qty 1

## 2023-08-02 MED ORDER — HYDROCODONE-ACETAMINOPHEN 5-325 MG PO TABS
1.0000 | ORAL_TABLET | Freq: Four times a day (QID) | ORAL | Status: DC | PRN
  Administered 2023-08-03 – 2023-08-08 (×5): 1 via ORAL
  Filled 2023-08-02 (×5): qty 1

## 2023-08-02 MED ORDER — ALBUTEROL SULFATE (2.5 MG/3ML) 0.083% IN NEBU
2.5000 mg | INHALATION_SOLUTION | Freq: Two times a day (BID) | RESPIRATORY_TRACT | Status: DC
  Administered 2023-08-03 – 2023-08-04 (×3): 2.5 mg via RESPIRATORY_TRACT
  Filled 2023-08-02 (×3): qty 3

## 2023-08-02 NOTE — ED Notes (Signed)
Multiple attempts by 2 Rns for IV access without success. IV team has been consulted

## 2023-08-02 NOTE — Progress Notes (Signed)
Heart Failure Navigator Progress Note  Assessed for Heart & Vascular TOC clinic readiness.  Patient does not meet criteria due to EF 50-55%, follows with [facility]. .   Navigator will sign off at this time.   Rhae Hammock, BSN, Scientist, clinical (histocompatibility and immunogenetics) Only

## 2023-08-02 NOTE — Progress Notes (Signed)
PHARMACY - ANTICOAGULATION CONSULT NOTE  Pharmacy Consult for warfarin dosing. Indication: atrial fibrillation and mechanical mitral valve replacement.   Allergies  Allergen Reactions   Neurontin [Gabapentin] Other (See Comments)    Weakness    Percocet [Oxycodone-Acetaminophen] Other (See Comments)    Percocet 5-325mg  dose too strong, OK to take Norco 5-325mg .    Patient Measurements: Height: 5\' 6"  (167.6 cm) Weight: 79.8 kg (176 lb) IBW/kg (Calculated) : 59.3  Vital Signs: Temp: 97.6 F (36.4 C) (01/16 1122) Temp Source: Oral (01/16 1122) BP: 116/46 (01/16 1122) Pulse Rate: 79 (01/16 1122)  Labs: Recent Labs    08/01/23 1239 08/01/23 1600 08/01/23 2015 08/02/23 0317  HGB 10.4*  --   --   --   HCT 30.9*  --   --   --   PLT 192  --   --   --   LABPROT  --   --  29.0* 33.0*  INR  --   --  2.7* 3.2*  CREATININE 2.12*  --   --  2.36*  TROPONINIHS 48* 44*  --   --     Estimated Creatinine Clearance: 16.2 mL/min (A) (by C-G formula based on SCr of 2.36 mg/dL (H)).   Medical History: Past Medical History:  Diagnosis Date   Arrhythmia    chronic atrial fib. with controlled rate ,on warfarin therapy   High cholesterol    Hypertension    Lower extremity edema    mild -stable   Mitral valve stenosis 10/1994   s/p St. Jude mechanical valve placement -27M-101 MODEL   Stroke (HCC) 08/2010   from office visit 2013- the setting of normal INR of 2.8 ;therefore increased goal to 3.5   Thyroid disease    hypothyroidism on synthroid    Medications:  Scheduled:   acetaminophen  1,000 mg Oral Q8H   [START ON 08/03/2023] albuterol  2.5 mg Nebulization BID   furosemide  40 mg Intravenous BID   rosuvastatin  40 mg Oral QHS   warfarin  2.5 mg Oral ONCE-1600   Warfarin - Pharmacist Dosing Inpatient   Does not apply q1600    Assessment: 89YOF with h/o atrial fibrillation and mechanical mitral valve replacement on warfarin who presented with SOB. PTA warfarin dosing: 2 mg  TThSaSun and 4 mg on MWF. Last warfarin dose 1/14. Pharmacy has been consulted to dose warfarin. INR today is 3.2 (therapeutic within target range goal of 2.5 - 3.5) Goal of Therapy:  INR Target range 2.5 - 3.5 Monitor platelet count.   Plan:  Warfarin 2 mg PO x one dose today at 1600 hours. Daily follow up of INR response and dose accordingly. Daily INR, CBC, and s/sx of bleeding.  Elicia Lamp, PharmD, CPP 08/02/2023,3:14 PM

## 2023-08-02 NOTE — Plan of Care (Signed)
Care plan reviewed.

## 2023-08-02 NOTE — Progress Notes (Signed)
Summary: Tina Wade is a 89 y.o. female with a 71-pack year history and past medical history significant for mitral valve replacement and prior L hip surgery presenting with difficulty getting up from her chair today and SOB and admitted for acute hypoxic respiratory failure   Hospital day: 1  Subjective:  Patient seen and evaluated while resting in bed. She reports she is feeling much better. She reports she is afraid to stand up. Denies pain or unsteadiness with walking and reports she is unsure why she is afraid to get up. She endorses using a walker at baseline. She denies CP and endorses hip pain. She requests tylenol for her pain.   Objective:  Vital signs in last 24 hours: Vitals:   08/02/23 0712 08/02/23 0728 08/02/23 1122 08/02/23 1603  BP: (!) 111/42 (!) 111/42 (!) 116/46 (!) 141/47  Pulse: 79 79 79 76  Resp: 20 20 18 20   Temp: (!) 97.3 F (36.3 C) (!) 97.3 F (36.3 C) 97.6 F (36.4 C) 98.2 F (36.8 C)  TempSrc: Axillary Axillary Oral Oral  SpO2: 93%  94% 97%  Weight:  79.8 kg    Height:  5\' 6"  (1.676 m)     Physical Exam General: NAD, pleasant, cooperative Cardiovascular: regular rate and rhythm Respiratory: normal work of breathing, crackles and rhonchi present bilaterally with improvement from yesterday Abdominal: soft, non-distended, normal bowel sounds Extremities: peripheral pitting edema present bilaterally improved since yesterday   Weight change:   Intake/Output Summary (Last 24 hours) at 08/02/2023 1717 Last data filed at 08/02/2023 1300 Gross per 24 hour  Intake 707 ml  Output --  Net 707 ml      Latest Ref Rng & Units 08/01/2023   12:39 PM 07/28/2023    7:41 AM 02/27/2019    3:52 AM  CBC  WBC 4.0 - 10.5 K/uL 15.3  7.9  6.0   Hemoglobin 12.0 - 15.0 g/dL 16.1  09.6  04.5   Hematocrit 36.0 - 46.0 % 30.9  44.8  34.9   Platelets 150 - 400 K/uL 192  167  162       Latest Ref Rng & Units 08/02/2023    3:17 AM 08/01/2023   12:39 PM 07/28/2023     7:41 AM  CMP  Glucose 70 - 99 mg/dL 409  811  914   BUN 8 - 23 mg/dL 782  956  62   Creatinine 0.44 - 1.00 mg/dL 2.13  0.86  5.78   Sodium 135 - 145 mmol/L 127  127  135   Potassium 3.5 - 5.1 mmol/L 5.0  5.5  5.0   Chloride 98 - 111 mmol/L 99  99  102   CO2 22 - 32 mmol/L 18  18  18    Calcium 8.9 - 10.3 mg/dL 7.9  8.5  8.8   Total Protein 6.5 - 8.1 g/dL  6.2  7.6   Total Bilirubin 0.0 - 1.2 mg/dL  0.8  1.1   Alkaline Phos 38 - 126 U/L  67  129   AST 15 - 41 U/L  39  46   ALT 0 - 44 U/L  32  53    PT: 29 -> 33 INR: 2.7 -> 3.2  Hemoglobin A1c: 5.5  Osmolality, Urine: 392 Sodium, Urine: 35  Renal U/S IMPRESSION: No right-sided renal collecting system dilatation. The left kidney is poorly seen today. Please correlate with prior CT 07/28/2023  Echo IMPRESSIONS   1. Left ventricular ejection fraction, by estimation,  is 50 to 55%. The left ventricle has low normal function. The left ventricle demonstrates  global hypokinesis. There is mild concentric left ventricular hypertrophy. Left ventricular diastolic parameters are consistent with Grade I diastolic dysfunction (impaired relaxation).   2. Right ventricular systolic function is normal. The right ventricular size is normal. There is normal pulmonary artery systolic pressure.   3. Left atrial size was severely dilated.   4. The mitral valve has been repaired/replaced. No evidence of mitral valve regurgitation. The mean mitral valve gradient is 3.8 mmHg. There is a 29 mm St. Jude mechanical valve present in the mitral position. Procedure Date: 1996. Echo findings are  consistent with normal structure and function of the mitral valve prosthesis.   5. The aortic valve is calcified. There is severe calcifcation of the aortic valve. There is moderate thickening of the aortic valve. Aortic valve regurgitation is moderate. Moderate aortic valve stenosis. Aortic regurgitation PHT measures 348 msec. Aortic valve mean gradient measures 22.8 mmHg.  Aortic valve Vmax measures 3.25 m/s.   6. Pulmonic valve regurgitation is moderate.   Assessment/Plan:  Principal Problem:   Acute hypoxic respiratory failure (HCC) Active Problems:   Moderate protein-calorie malnutrition (HCC)   Congestive heart failure (HCC)   Chronic kidney disease  Tina Wade is a 89 y.o. female with a 71-pack year history and past medical history significant for mitral valve replacement and prior L hip surgery presenting with difficulty getting up from her chair today and SOB and admitted to the IMTS for acute hypoxic respiratory failure on hospital day 1.    Acute hypoxic respiratory failure Congestive heart failure Patient's volume status appears to be improved on exam today with decreased crackles, rhonchi and peripheral edema since yesterday. Suspect her hypoxic respiratory failure is secondary to volume overload as she has seemed to be responding to lasix. Echo revealed EF 50-55% with left ventricle having low normal function, the left ventricle demonstrating global hypokinesis, mild concentric left ventricular hypertrophy and left ventricular diastolic parameters are consistent with Grade I diastolic dysfunction (impaired relaxation). COPD exacerbation still possible; however, no wheezing present on exam today. PE still considered, doppler U/S ordered.  - Continue furosemide 40 mg IV BID - Monitor I/O, daily weights - Telemetry - Doppler U/S   AKI Patient's creatinine increased to 2.36 from 2.12. BUN:Cr > 20, so suspect prerenal 2/2 diuresis. Urine osmolality and sodium WNL.  - BMP tomorrow AM  L Hip Pain Foot Cramps Per chart review, patient has history of percutaneous fixation of L femoral neck fracture in 02/23/2019. She endorses continued pain and reports she is following with an orthopedic physician outpatient. Plan to get imaging to assess her hip with given pain and weakness. Additionally, nursing reports patient is having cramps in her feet and  states patient usually takes medicine for this at home - Hip Xray - PT/OT ordered  - Start Norco 5-325 mg q6 PRN and tylenol 1000 mg q12 - Magnesium lab tomorrow AM - Calcium lab tomorrow AM   Increased Urinary Frequency Patient endorsed increased urinary frequency for the past 2 weeks on admission. UA and microscopy revealed small leukocytes, moderate Hgb and rare bacteria. Patient had dispense of bactrim on 1/13; bactrim transitioned to ceftriaxone given patient's hyperkalemia on admission. Urine cultured ordered. - Continue ceftriaxone 1 g IV - Urine cultures   Atrial Fibrillation Mitral Valve Replacement  Per chart review, patient has chronic a fib with intrinsically controlled rate on warfarin. A fib present on exam today.  Rate controlled. Patient reports mitral heart valve replacement about 4 years ago.  - Continue home Warfarin dosing   Hypothyroidism Noted on chart review. TSH WNL. Will continue home levothyroxine   Code: DNR-Limited VTE Prophylaxis: Warfarin Diet: Heart Fluids: N/A   LOS: 1 day   Bubba Hales, Medical Student 08/02/2023, 5:17 PM

## 2023-08-02 NOTE — TOC Initial Note (Signed)
Transition of Care [facility]) - Initial/Assessment Note    Patient Details  Name: Tina Wade MRN: [id number] Date of Birth: 1929/04/07  Transition of [facility]) CM/SW Contact:    Leone Haven, RN Phone Number: 08/02/2023, 4:22 PM  Clinical Narrative:                 From home with daughter, has PCP and insurance on file, states has no HH services in place at this time, has lift chair, bsc, walker, oxygen concentrator that she does not use and tanks, transport chair. at home.  States family member will transport them home at [employer] and family is support system, states gets [facility] [address]  and [facility].  Pta self ambulatory.  She will benefit from pt/ot eval.  NCM asked MD for orders for eval.   Expected Discharge Plan: Home w Home Health Services Barriers to Discharge: Continued Medical Work up   Patient Goals and CMS Choice Patient states their goals for this hospitalization and ongoing recovery are:: get better   Choice offered to / list presented to : NA      Expected Discharge Plan and Services In-house Referral: NA Discharge Planning Services: CM Consult Post Acute Care Choice: NA Living arrangements for the past 2 months: Single Family Home                 DME Arranged: N/A DME Agency: NA       HH Arranged: NA          Prior Living Arrangements/Services Living arrangements for the past 2 months: Single Family Home Lives with:: Adult Children (daughter) Patient language and need for interpreter reviewed:: Yes Do you feel safe going back to the place where you live?: Yes      Need for Family Participation in Patient Care: Yes (Comment) Care giver support system in place?: Yes (comment) Current home services: DME (lift chair, bsc, walker, oxygen concentrator that she does not use and tanks, transport chair.) Criminal Activity/Legal Involvement Pertinent to Current Situation/Hospitalization: No - Comment as needed  Activities of  Daily Living   ADL Screening (condition at time of admission) Independently performs ADLs?: Yes (appropriate for developmental age) Is the patient deaf or have difficulty hearing?: No Does the patient have difficulty seeing, even when wearing glasses/contacts?: No Does the patient have difficulty concentrating, remembering, or making decisions?: No  Permission Sought/Granted Permission sought to share information with : Case Manager Permission granted to share information with : Yes, Verbal Permission Granted  Share Information with NAME: daughter Diannia Ruder           Emotional Assessment Appearance:: Appears stated age       Alcohol / Substance Use: Not Applicable Psych Involvement: No (comment)  Admission diagnosis:  Hip pain, unspecified laterality [M25.559] Chronic kidney disease, unspecified CKD stage [N18.9] Acute on chronic congestive heart failure, unspecified heart failure type (HCC) [I50.9] Acute hypoxic respiratory failure (HCC) [J96.01] Patient Active Problem List   Diagnosis Date Noted   Chronic kidney disease 08/02/2023   Acute hypoxic respiratory failure (HCC) 08/01/2023   Moderate protein-calorie malnutrition (HCC) 08/01/2023   Congestive heart failure (HCC) 08/01/2023   Left displaced femoral neck fracture (HCC) 02/22/2019   Closed left hip fracture, initial encounter (HCC) 02/22/2019   Anticoagulated on warfarin    Nonrheumatic aortic valve stenosis 11/20/2018   Dyspnea on minimal exertion 11/07/2018   TIA (transient ischemic attack) 05/24/2018   HLD (hyperlipidemia) 05/24/2018   Reactive airway disease  05/24/2018   Chronic combined systolic and diastolic CHF, NYHA class 2 (HCC) 11/14/2017   COPD (chronic obstructive pulmonary disease) (HCC) 11/07/2016   Dysphagia 09/20/2016   SAH (subarachnoid hemorrhage) (HCC) 09/16/2015   Hypokalemia 09/12/2015   History of TIA (transient ischemic attack) 03/26/2014   Bilateral lower extremity edema - mild 05/02/2013    Cigarette smoker one half pack a day or less unmotivated he quit -  05/02/2013   Chronic atrial fibrillation (HCC) 04/30/2013   Essential hypertension 04/30/2013   Iron deficiency anemia due to chronic blood loss 01/03/2013   Adenoma of left adrenal gland 12/22/2012   Long term (current) use of anticoagulants 10/03/2012   CVA (cerebral vascular accident) (HCC) 10/03/2012   H/O mitral valve replacement 10/03/2012   Gastrointestinal hemorrhage with melena 06/18/2012   Age-related osteoporosis without current pathological fracture 02/13/2012   Cardiomegaly 01/11/2012   Former smoker 04/04/2011   Old embolic stroke without late effect 10/18/2010   Hypothyroidism 08/04/2010   PCP:  Tracey Harries, MD Pharmacy:   [facility] [id number] [place], [place] - [address] [place] [address] Phone: [phone] Fax: [phone]  [id number] [place], [place] - [address] [place] [address] Phone: [phone] Fax: [phone]     Social Drivers of Health (SDOH) Social History: SDOH Screenings   Food Insecurity: No Food Insecurity (08/02/2023)  Housing: Low Risk  (08/02/2023)  Transportation Needs: No Transportation Needs (08/02/2023)  Utilities: Not At Risk (08/02/2023)  Financial Resource Strain: Low Risk  (07/23/2023)   Received from [facility]  Physical Activity: Unknown (09/12/2022)   Received from [facility]  Social Connections: Moderately Isolated (08/02/2023)  Stress: No Stress Concern Present (05/06/2023)   Received from [facility]  Tobacco Use: High Risk (08/01/2023)   SDOH Interventions:     Readmission Risk Interventions     No data to display

## 2023-08-02 NOTE — Progress Notes (Signed)
  Echocardiogram 2D Echocardiogram has been performed.  Delcie Roch 08/02/2023, 4:02 PM

## 2023-08-03 ENCOUNTER — Inpatient Hospital Stay (HOSPITAL_COMMUNITY): Payer: Medicare Other

## 2023-08-03 LAB — CBC
HCT: 23.9 % — ABNORMAL LOW (ref 36.0–46.0)
HCT: 22.9 % — ABNORMAL LOW (ref 36.0–46.0)
HCT: 24.3 % — ABNORMAL LOW (ref 36.0–46.0)
Hemoglobin: 8.1 g/dL — ABNORMAL LOW (ref 12.0–15.0)
Hemoglobin: 7.7 g/dL — ABNORMAL LOW (ref 12.0–15.0)
Hemoglobin: 8.2 g/dL — ABNORMAL LOW (ref 12.0–15.0)
MCH: 32.1 pg (ref 26.0–34.0)
MCH: 32.2 pg (ref 26.0–34.0)
MCH: 32.5 pg (ref 26.0–34.0)
MCHC: 33.9 g/dL (ref 30.0–36.0)
MCHC: 33.6 g/dL (ref 30.0–36.0)
MCHC: 33.7 g/dL (ref 30.0–36.0)
MCV: 94.8 fL (ref 80.0–100.0)
MCV: 95.8 fL (ref 80.0–100.0)
MCV: 96.4 fL (ref 80.0–100.0)
Platelets: 188 10*3/uL (ref 150–400)
Platelets: 190 10*3/uL (ref 150–400)
Platelets: 216 10*3/uL (ref 150–400)
RBC: 2.52 MIL/uL — ABNORMAL LOW (ref 3.87–5.11)
RBC: 2.39 MIL/uL — ABNORMAL LOW (ref 3.87–5.11)
RBC: 2.52 MIL/uL — ABNORMAL LOW (ref 3.87–5.11)
RDW: 16.2 % — ABNORMAL HIGH (ref 11.5–15.5)
RDW: 16.6 % — ABNORMAL HIGH (ref 11.5–15.5)
RDW: 17.1 % — ABNORMAL HIGH (ref 11.5–15.5)
WBC: 18.8 10*3/uL — ABNORMAL HIGH (ref 4.0–10.5)
WBC: 18.5 10*3/uL — ABNORMAL HIGH (ref 4.0–10.5)
WBC: 21.3 10*3/uL — ABNORMAL HIGH (ref 4.0–10.5)
nRBC: 5.5 % — ABNORMAL HIGH (ref 0.0–0.2)
nRBC: 5.5 % — ABNORMAL HIGH (ref 0.0–0.2)
nRBC: 6.2 % — ABNORMAL HIGH (ref 0.0–0.2)

## 2023-08-03 LAB — BASIC METABOLIC PANEL
Anion gap: 11 (ref 5–15)
BUN: 106 mg/dL — ABNORMAL HIGH (ref 8–23)
CO2: 22 mmol/L (ref 22–32)
Calcium: 8 mg/dL — ABNORMAL LOW (ref 8.9–10.3)
Chloride: 96 mmol/L — ABNORMAL LOW (ref 98–111)
Creatinine, Ser: 1.99 mg/dL — ABNORMAL HIGH (ref 0.44–1.00)
GFR, Estimated: 23 mL/min — ABNORMAL LOW (ref >60)
Glucose, Bld: 152 mg/dL — ABNORMAL HIGH (ref 70–99)
Potassium: 4.2 mmol/L (ref 3.5–5.1)
Sodium: 129 mmol/L — ABNORMAL LOW (ref 135–145)

## 2023-08-03 LAB — URINE CULTURE: Culture: <10000 — AB

## 2023-08-03 LAB — PROTIME-INR
INR: 4.4 (ref 0.8–1.2)
INR: 3.6 — ABNORMAL HIGH (ref 0.8–1.2)
Prothrombin Time: 42.1 s — ABNORMAL HIGH (ref 11.4–15.2)
Prothrombin Time: 36.4 s — ABNORMAL HIGH (ref 11.4–15.2)

## 2023-08-03 LAB — RETICULOCYTES
Immature Retic Fract: 49.3 % — ABNORMAL HIGH (ref 2.3–15.9)
RBC.: 2.46 MIL/uL — ABNORMAL LOW (ref 3.87–5.11)
Retic Count, Absolute: 110.7 10*3/uL (ref 19.0–186.0)
Retic Ct Pct: 4.5 % — ABNORMAL HIGH (ref 0.4–3.1)

## 2023-08-03 LAB — VITAMIN B12: Vitamin B-12: 1374 pg/mL — ABNORMAL HIGH (ref 180–914)

## 2023-08-03 LAB — IRON AND TIBC
Iron: 30 ug/dL (ref 28–170)
Saturation Ratios: 8 % — ABNORMAL LOW (ref 10.4–31.8)
TIBC: 358 ug/dL (ref 250–450)
UIBC: 328 ug/dL

## 2023-08-03 LAB — UREA NITROGEN, URINE: Urea Nitrogen, Ur: 627 mg/dL

## 2023-08-03 LAB — FERRITIN: Ferritin: 116 ng/mL (ref 11–307)

## 2023-08-03 LAB — MAGNESIUM: Magnesium: 2.5 mg/dL — ABNORMAL HIGH (ref 1.7–2.4)

## 2023-08-03 MED ORDER — LEVOTHYROXINE SODIUM 100 MCG PO TABS
100.0000 ug | ORAL_TABLET | Freq: Every day | ORAL | Status: DC
  Administered 2023-08-03 – 2023-08-09 (×7): 100 ug via ORAL
  Filled 2023-08-03 (×7): qty 1

## 2023-08-03 MED ORDER — ORAL CARE MOUTH RINSE: 15.0000 mL | OROMUCOSAL | Status: DC | PRN

## 2023-08-03 MED ORDER — ATORVASTATIN CALCIUM 80 MG PO TABS
80.0000 mg | ORAL_TABLET | Freq: Every day | ORAL | Status: DC
Start: 2023-08-03 — End: 2023-08-09
  Administered 2023-08-03 – 2023-08-09 (×7): 80 mg via ORAL
  Filled 2023-08-03 (×7): qty 1

## 2023-08-03 NOTE — Progress Notes (Signed)
Summary: Tina Wade is a 89 y.o. female with a 71-pack year history and past medical history significant for mitral valve replacement and prior L hip surgery presenting with difficulty getting up from her chair today and SOB and admitted for acute hypoxic respiratory failure    Hospital Day: 2  Subjective:  Patient seen and evaluated while resting in bed. She reports she is fine. She endorses R dull hip pain. Patient's daughter in the room and mentioned they just gave patient a bath prior to the team coming in and moved her around. She said she suspect this pain is related to her moving in bed during her bath. Patient reports her breathing is better today. She denies CP, abdominal pain, dysuria, burning with urination, hematuria or hematochezia. Patient's daughter states she is weaker and has had hallucinations some today.   Objective:  Vital signs in last 24 hours: Vitals:   08/02/23 1918 08/03/23 0048 08/03/23 0300 08/03/23 0413  BP: 113/75 (!) 117/49  123/60  Pulse: 72 76  73  Resp: 17 17  17   Temp: 97.6 F (36.4 C) 97.8 F (36.6 C)  97.6 F (36.4 C)  TempSrc: Oral Oral  Oral  SpO2: 95% 93%  92%  Weight:   79.7 kg   Height:       Physical Exam Cardiovascular:     Rate and Rhythm: Normal rate.     Heart sounds: Murmur heard.  Pulmonary:     Breath sounds: Rhonchi and rales present.  Abdominal:     General: Bowel sounds are normal.     Palpations: Abdomen is soft.  Musculoskeletal:     Right lower leg: Edema present.     Left lower leg: Edema present.  Skin:    General: Skin is warm.  Neurological:     Mental Status: She is alert and oriented to person, place, and time.     Motor: Weakness present.  Psychiatric:        Behavior: Behavior is cooperative.   Weight change:   Intake/Output Summary (Last 24 hours) at 08/03/2023 0634 Last data filed at 08/03/2023 0418 Gross per 24 hour  Intake 827 ml  Output 200 ml  Net 627 ml      Latest Ref Rng & Units  08/03/2023   12:48 PM 08/03/2023    3:05 AM 08/01/2023   12:39 PM  CBC  WBC 4.0 - 10.5 K/uL 18.5  18.8  15.3   Hemoglobin 12.0 - 15.0 g/dL 7.7  8.1  54.0   Hematocrit 36.0 - 46.0 % 22.9  23.9  30.9   Platelets 150 - 400 K/uL 190  188  192       Latest Ref Rng & Units 08/03/2023    3:05 AM 08/02/2023    3:17 AM 08/01/2023   12:39 PM  CMP  Glucose 70 - 99 mg/dL 981  191  478   BUN 8 - 23 mg/dL 295  621  308   Creatinine 0.44 - 1.00 mg/dL 6.57  8.46  9.62   Sodium 135 - 145 mmol/L 129  127  127   Potassium 3.5 - 5.1 mmol/L 4.2  5.0  5.5   Chloride 98 - 111 mmol/L 96  99  99   CO2 22 - 32 mmol/L 22  18  18    Calcium 8.9 - 10.3 mg/dL 8.0  7.9  8.5   Total Protein 6.5 - 8.1 g/dL   6.2   Total Bilirubin 0.0 - 1.2  mg/dL   0.8   Alkaline Phos 38 - 126 U/L   67   AST 15 - 41 U/L   39   ALT 0 - 44 U/L   32    PT: 42.1 INR: 4.4  Vascular U/S Summary:  BILATERAL:  - No evidence of deep vein thrombosis seen in the lower extremities, bilaterally.  - No evidence of popliteal cyst, bilaterally.   Assessment/Plan:  Principal Problem:   Acute hypoxic respiratory failure (HCC) Active Problems:   Moderate protein-calorie malnutrition (HCC)   Congestive heart failure (HCC)   Chronic kidney disease  Tina Wade is a 89 y.o. female with a 71-pack year history and past medical history significant for mitral valve replacement and prior L hip surgery presenting with difficulty getting up from her chair today and SOB and admitted to the IMTS for acute hypoxic respiratory failure on hospital day 2.    Acute hypoxic respiratory failure Congestive heart failure Patient's volume status appears to be improved on exam today with decreased peripheral edema since yesterday. Suspect her hypoxic respiratory failure is secondary to volume overload as she has seemed to be responding to lasix. No evidence of DVT seen on U/S. Diastolic BP has been soft. Will continue to monitor.  - Continue furosemide 40  mg IV BID - Monitor I/O, daily weights - Telemetry  Anemia Patients Hgb on admission 10.4 and decreased to 8.1 today. CBC ordered this afternoon and Hgb resulted 7.7 with MCV of 95.8 revealing a normocytic anemia. Concerned with decreasing Hgb. INR elevated today; warfarin paused per pharmacy. Patient denies blood in stool or urine. Unsure source of decreased Hgb levels. Will obtain the following labs: - CBC at 2100 tonight and tomorrow AM - Ferritin - Iron and TIBC - Vitamin B12 - Reticulocyte Count  Possible Delirium Mentation oriented to person, place and time. Patient's daughter reports she had hallucinations earlier. Suspect this is delirium 2/2 to be in hospital. Will continue to monitor.    AKI Improved. Patient's creatinine decreased to 1.99 from 2.36. BUN:Cr > 20, so continue to suspect prerenal 2/2 diuresis. Urine osmolality and sodium WNL.  - BMP tomorrow AM   L Hip Pain Foot Cramps Per chart review, patient has history of percutaneous fixation of L femoral neck fracture in 02/23/2019. She endorsed continued pain and reports she is following with an orthopedic physician outpatient. L hip x-ray revealed progressive and advanced osteoarthritis of the L hip, healed previous femoral neck fracture and osteopenia/osteoporosis. - PT/OT ordered  - Norco 5-325 mg q6 PRN and tylenol 1000 mg q12   Increased Urinary Frequency Patient endorsed increased urinary frequency for the past 2 weeks on admission. UA and microscopy revealed small leukocytes, moderate Hgb and rare bacteria. Patient had dispense of bactrim on 1/13; bactrim transitioned to ceftriaxone yesterday given patient's hyperkalemia on admission. Urine cultures resulted <10,000 colonies/mL - Continue ceftriaxone 1 g IV    Atrial Fibrillation Mitral Valve Replacement  Per chart review, patient has chronic a fib with intrinsically controlled rate on warfarin. Rate controlled. Patient reports mitral heart valve replacement about 4  years ago. Patients PT increased to 42.1 and IRN increased to 4.4, so pharmacy holding Warfarin today.  - Hold warfarin per pharmacy    Hypothyroidism Noted on chart review. TSH WNL. Will continue home levothyroxine   Code: DNR-Limited VTE Prophylaxis: None Diet: Heart Fluids: N/A   LOS: 2 days   Bubba Hales, Medical Student 08/03/2023, 6:34 AM

## 2023-08-03 NOTE — Plan of Care (Signed)

## 2023-08-03 NOTE — Progress Notes (Signed)
PHARMACY - ANTICOAGULATION CONSULT NOTE  Pharmacy Consult for warfarin dosing. Indication: atrial fibrillation and mechanical prosthetic valve replacement requiring INR target range to be 2.5 - 3.5.  Allergies  Allergen Reactions   Neurontin [Gabapentin] Other (See Comments)    Weakness    Percocet [Oxycodone-Acetaminophen] Other (See Comments)    Percocet 5-325mg  dose too strong, OK to take Norco 5-325mg .    Patient Measurements: Height: 5\' 6"  (167.6 cm) Weight: 79.7 kg (175 lb 11.3 oz) IBW/kg (Calculated) : 59.3  Vital Signs: Temp: 97.7 F (36.5 C) (01/17 0715) Temp Source: Oral (01/17 0715) BP: 118/48 (01/17 0715) Pulse Rate: 77 (01/17 0715)  Labs: Recent Labs    08/01/23 1239 08/01/23 1600 08/01/23 2015 08/02/23 0317 08/03/23 0305  HGB 10.4*  --   --   --  8.1*  HCT 30.9*  --   --   --  23.9*  PLT 192  --   --   --  188  LABPROT  --   --  29.0* 33.0* 42.1*  INR  --   --  2.7* 3.2* 4.4*  CREATININE 2.12*  --   --  2.36* 1.99*  TROPONINIHS 48* 44*  --   --   --     Estimated Creatinine Clearance: 19.2 mL/min (A) (by C-G formula based on SCr of 1.99 mg/dL (H)).   Medical History: Past Medical History:  Diagnosis Date   Arrhythmia    chronic atrial fib. with controlled rate ,on warfarin therapy   High cholesterol    Hypertension    Lower extremity edema    mild -stable   Mitral valve stenosis 10/1994   s/p [employer] Jude mechanical valve placement -67M-101 MODEL   Stroke (HCC) 08/2010   from office visit 2013- the setting of normal INR of 2.8 ;therefore increased goal to 3.5   Thyroid disease    hypothyroidism on synthroid    Medications:  Scheduled:   acetaminophen  1,000 mg Oral Q12H   albuterol  2.5 mg Nebulization BID   levothyroxine  100 mcg Oral QAC breakfast   rosuvastatin  40 mg Oral QHS   Warfarin - Pharmacist Dosing Inpatient   Does not apply q1600    Assessment: 89YOF with h/o atrial fibrillation and mechanical mitral valve replacement on  warfarin who presented with SOB. PTA warfarin dosing: 2 mg TThSaSun and 4 mg on MWF. Last warfarin dose 1/14. Pharmacy has been consulted to dose warfarin. INR today is 4.4 collected at 0305 reflective of the  hypoalbuminemia (2.9 g/dL). Will omit today's dose of warfarin therapy. Goal of Therapy:  INR target range 2.5 - 3.5 Monitor platelets by anticoagulation protocol: Yes. PLTC stable and in range at 188 K/uL.    Plan:  OMIT warfarin dose for today. Follow up INR on Saturday 18-JAN-25 and dose or hold accordingly. Daily INR, CBC and observe for any signs or symptoms of bleeding.   Elicia Lamp, PharmD, CPP 08/03/2023,8:46 AM

## 2023-08-03 NOTE — Plan of Care (Signed)
Care plan reviewed.

## 2023-08-04 DIAGNOSIS — R791 Abnormal coagulation profile: Secondary | ICD-10-CM | POA: Insufficient documentation

## 2023-08-04 DIAGNOSIS — J9601 Acute respiratory failure with hypoxia: Secondary | ICD-10-CM | POA: Diagnosis not present

## 2023-08-04 DIAGNOSIS — N189 Chronic kidney disease, unspecified: Secondary | ICD-10-CM | POA: Diagnosis not present

## 2023-08-04 DIAGNOSIS — N390 Urinary tract infection, site not specified: Secondary | ICD-10-CM | POA: Insufficient documentation

## 2023-08-04 DIAGNOSIS — D649 Anemia, unspecified: Secondary | ICD-10-CM

## 2023-08-04 DIAGNOSIS — R410 Disorientation, unspecified: Secondary | ICD-10-CM | POA: Insufficient documentation

## 2023-08-04 DIAGNOSIS — I509 Heart failure, unspecified: Secondary | ICD-10-CM | POA: Diagnosis not present

## 2023-08-04 LAB — BASIC METABOLIC PANEL
Anion gap: 12 (ref 5–15)
BUN: 86 mg/dL — ABNORMAL HIGH (ref 8–23)
CO2: 21 mmol/L — ABNORMAL LOW (ref 22–32)
Calcium: 8 mg/dL — ABNORMAL LOW (ref 8.9–10.3)
Chloride: 96 mmol/L — ABNORMAL LOW (ref 98–111)
Creatinine, Ser: 1.76 mg/dL — ABNORMAL HIGH (ref 0.44–1.00)
GFR, Estimated: 27 mL/min — ABNORMAL LOW (ref >60)
Glucose, Bld: 134 mg/dL — ABNORMAL HIGH (ref 70–99)
Potassium: 4.1 mmol/L (ref 3.5–5.1)
Sodium: 129 mmol/L — ABNORMAL LOW (ref 135–145)

## 2023-08-04 LAB — HEMOGLOBIN AND HEMATOCRIT, BLOOD
HCT: 22.4 % — ABNORMAL LOW (ref 36.0–46.0)
HCT: 24.2 % — ABNORMAL LOW (ref 36.0–46.0)
Hemoglobin: 7.3 g/dL — ABNORMAL LOW (ref 12.0–15.0)
Hemoglobin: 8 g/dL — ABNORMAL LOW (ref 12.0–15.0)

## 2023-08-04 LAB — CBC
HCT: 22.1 % — ABNORMAL LOW (ref 36.0–46.0)
Hemoglobin: 7.3 g/dL — ABNORMAL LOW (ref 12.0–15.0)
MCH: 32.3 pg (ref 26.0–34.0)
MCHC: 33 g/dL (ref 30.0–36.0)
MCV: 97.8 fL (ref 80.0–100.0)
Platelets: 185 10*3/uL (ref 150–400)
RBC: 2.26 MIL/uL — ABNORMAL LOW (ref 3.87–5.11)
RDW: 17.2 % — ABNORMAL HIGH (ref 11.5–15.5)
WBC: 19.6 10*3/uL — ABNORMAL HIGH (ref 4.0–10.5)
nRBC: 5.4 % — ABNORMAL HIGH (ref 0.0–0.2)

## 2023-08-04 LAB — PROTIME-INR
INR: 3.4 — ABNORMAL HIGH (ref 0.8–1.2)
Prothrombin Time: 34.3 s — ABNORMAL HIGH (ref 11.4–15.2)

## 2023-08-04 MED ORDER — SODIUM CHLORIDE 0.9 % IV SOLN
500.0000 mg | Freq: Once | INTRAVENOUS | Status: DC
  Filled 2023-08-04: qty 25

## 2023-08-04 MED ORDER — IRON SUCROSE 500 MG IVPB - SIMPLE MED: 500.0000 mg | Freq: Once | INTRAVENOUS | Status: DC

## 2023-08-04 MED ORDER — IPRATROPIUM-ALBUTEROL 0.5-2.5 (3) MG/3ML IN SOLN
3.0000 mL | Freq: Two times a day (BID) | RESPIRATORY_TRACT | Status: DC
  Administered 2023-08-05 – 2023-08-09 (×9): 3 mL via RESPIRATORY_TRACT
  Filled 2023-08-04 (×10): qty 3

## 2023-08-04 MED ORDER — FUROSEMIDE 10 MG/ML IJ SOLN
40.0000 mg | Freq: Once | INTRAMUSCULAR | Status: AC
  Administered 2023-08-04: 40 mg via INTRAVENOUS
  Filled 2023-08-04: qty 4

## 2023-08-04 MED ORDER — SODIUM CHLORIDE 0.9 % IV SOLN
500.0000 mg | Freq: Once | INTRAVENOUS | Status: AC
  Administered 2023-08-04: 500 mg via INTRAVENOUS
  Filled 2023-08-04: qty 25

## 2023-08-04 MED ORDER — IPRATROPIUM-ALBUTEROL 0.5-2.5 (3) MG/3ML IN SOLN
3.0000 mL | Freq: Four times a day (QID) | RESPIRATORY_TRACT | Status: DC
  Administered 2023-08-04 (×2): 3 mL via RESPIRATORY_TRACT
  Filled 2023-08-04 (×2): qty 3

## 2023-08-04 MED ORDER — IRON SUCROSE 500 MG IVPB - SIMPLE MED
500.0000 mg | Freq: Once | INTRAVENOUS | Status: DC
  Filled 2023-08-04: qty 275

## 2023-08-04 MED ORDER — ACETAMINOPHEN 500 MG PO TABS
1000.0000 mg | ORAL_TABLET | Freq: Two times a day (BID) | ORAL | Status: DC | PRN
  Administered 2023-08-05: 1000 mg via ORAL
  Filled 2023-08-04: qty 2

## 2023-08-04 NOTE — Progress Notes (Signed)
Physical Therapy Evaluation Patient Details Name: Tina Wade MRN: [id number] DOB: 15-Jul-1929 Today's Date: 08/04/2023  History of Present Illness  89 y.o. female who presented on 08/01/23 with  SOB, acute hypoxic respiratory failure, and CHF. Imaging: Progressive and advanced osteoarthritis of the left hip, osteopenia/osteoporosis, and   aortic atherosclerosis. Of note, pt had PNA 2-3 weeks ago, chronic L hip pain secondary to proximal femur fx in 2020, and recent UTI. PMHx: arrhythmia, high cholesterol, HTN, lower extremity edema, mitral valve stenosis, CVA, hypothyroid diease.  Clinical Impression  Pt admitted from her home secondary to conditions discussed above. Prior to her admission pt was mod independent for bed mobility, transfers, gait inside the home, and activities such as dressing herself and toileting. Pt lives with her daughter and granddaughter who provide the pt assistance with other household activities and ADLs. Pt's primary impairments include decreased activity tolerance, bilateral lower extremity weakness, balance, gait, and left hip pain. During the session, pt was max to total assist x2 for transferring to EOB secondary to fatigue, pain, and anxiety. The pt's family reports they may prefer to take pt home. If so, they would require the DME listed below and max HH services, including a home health aid. At this time the pt is needing extensive physical assistance that may be difficult for them to safely manage at home. Concerning pt's PLOF and in order to optimize independence and decrease care giver burden, currently recommending inpatient rehab. If pt's activity tolerance improves she could benefit from intensive inpatient rehab, > 3 hours per day. If not, then may need to pursue less intensive options, < 3 hours per day. Will continue to follow acutely.       If plan is discharge home, recommend the following: Assistance with cooking/housework;Assist for transportation;Two  people to help with walking and/or transfers;Two people to help with bathing/dressing/bathroom;Direct supervision/assist for medications management;Direct supervision/assist for financial management;Help with stairs or ramp for entrance   Can travel by private vehicle        Equipment Recommendations Hospital bed;Hoyer lift  Recommendations for Other Services  Rehab consult    Functional Status Assessment Patient has had a recent decline in their functional status and/or demonstrates limited ability to make significant improvements in function in a reasonable and predictable amount of time     Precautions / Restrictions Precautions Precautions: Fall Restrictions Weight Bearing Restrictions Per Provider Order: No      Mobility  Bed Mobility Overal bed mobility: Needs Assistance       Supine to sit: Max assist, +2 for physical assistance, HOB elevated          Transfers                        Ambulation/Gait                  Stairs            Wheelchair Mobility     Tilt Bed    Modified Rankin (Stroke Patients Only)       Balance Overall balance assessment: Needs assistance Sitting-balance support: Feet supported, Bilateral upper extremity supported Sitting balance-Leahy Scale: Fair                                       Pertinent Vitals/Pain Pain Assessment Pain Assessment: Faces Faces Pain Scale: Hurts whole lot Pain Location: L  hip    Home Living Family/patient expects to be discharged to:: Private residence Living Arrangements: Children Available Help at Discharge: Family;Available 24 hours/day (Pt's family interested in getting home health aid to help them care for the pt around the home.) Type of Home: House Home Access:  (Threshold) Entrance Stairs-Rails: None Entrance Stairs-Number of Steps: 1   Home Layout: One level Home Equipment: Grab bars - tub/shower;Hand held shower head;Shower seat;Rollator (4  wheels);Cane - single point;BSC/3in1;Transport chair;Lift chair;Grab bars - toilet      Prior Function Prior Level of Function : Independent/Modified Independent             Mobility Comments: Mod I for bed mobility with increased time for ambulation and transfers using rollator ADLs Comments: Mod I for activities such as dressing herself and tolieting. Daughter/family performs household work and chores     Extremity/Trunk Assessment   Upper Extremity Assessment Upper Extremity Assessment: Defer to OT evaluation    Lower Extremity Assessment Lower Extremity Assessment: Generalized weakness;RLE deficits/detail;LLE deficits/detail RLE Deficits / Details: Edema LLE Deficits / Details: Edema    Cervical / Trunk Assessment Cervical / Trunk Assessment: Normal  Communication   Communication Communication: Difficulty communicating thoughts/reduced clarity of speech Cueing Techniques: Verbal cues;Tactile cues  Cognition Arousal: Lethargic Behavior During Therapy: Anxious Overall Cognitive Status: Impaired/Different from baseline                                          General Comments General comments (skin integrity, edema, etc.): VSS on RA, was on 2L upon arrival and left pt on 2L end of session    Exercises     Assessment/Plan    PT Assessment Patient needs continued PT services  PT Problem List Decreased strength;Decreased activity tolerance;Decreased mobility;Decreased balance;Decreased knowledge of use of DME;Decreased safety awareness;Decreased knowledge of precautions;Cardiopulmonary status limiting activity;Pain       PT Treatment Interventions DME instruction;Gait training;Stair training;Functional mobility training;Therapeutic activities;Therapeutic exercise;Balance training;Neuromuscular re-education;Patient/family education    PT Goals (Current goals can be found in the Care Plan section)  Acute Rehab PT Goals Patient Stated Goal: Return  Home PT Goal Formulation: With patient/family Time For Goal Achievement: 08/18/23 Potential to Achieve Goals: Fair    Frequency Min 1X/week     Co-evaluation               AM-PAC PT "6 Clicks" Mobility  Outcome Measure Help needed turning from your back to your side while in a flat bed without using bedrails?: Total Help needed moving from lying on your back to sitting on the side of a flat bed without using bedrails?: Total Help needed moving to and from a bed to a chair (including a wheelchair)?: Total Help needed standing up from a chair using your arms (e.g., wheelchair or bedside chair)?: Total Help needed to walk in hospital room?: Total Help needed climbing 3-5 steps with a railing? : Total 6 Click Score: 6    End of Session Equipment Utilized During Treatment: Oxygen Activity Tolerance: Patient limited by fatigue;Patient limited by lethargy;Patient limited by pain;Other (comment) (limited by anxiety) Patient left: in bed;with call bell/phone within reach;with bed alarm set;with family/visitor present Nurse Communication: Mobility status PT Visit Diagnosis: Unsteadiness on feet (R26.81);Other abnormalities of gait and mobility (R26.89);Muscle weakness (generalized) (M62.81);Difficulty in walking, not elsewhere classified (R26.2);Pain Pain - Right/Left: Left Pain - part of body: Hip  Time: 1610-9604 PT Time Calculation (min) (ACUTE ONLY): 42 min   Charges:   PT Evaluation $PT Eval Moderate Complexity: 1 Mod PT Treatments $Therapeutic Activity: 23-37 mins PT General Charges $$ ACUTE PT VISIT: 1 Visit         [address], SPT    [place] 08/04/2023, 6:05 PM

## 2023-08-04 NOTE — Progress Notes (Signed)
Overview: Tina Wade is a 89 y.o. female with a 71-pack year history and past medical history significant for mitral valve replacement and prior L hip surgery presenting with difficulty getting up from her chair today and SOB and admitted for acute hypoxic respiratory failure .  Overnight:NAEON  Subjective: No acute concerns. States has some hip pain L>R.  Objective:  Vital signs in last 24 hours: Vitals:   08/04/23 0418 08/04/23 0500 08/04/23 0745 08/04/23 0847  BP: (!) 112/48  (!) 136/50   Pulse: 81  72   Resp: 19  17   Temp: 98.5 F (36.9 C)  (!) 96.5 F (35.8 C)   TempSrc: Axillary  Axillary   SpO2: 91%  91% 93%  Weight:  78.7 kg    Height:       Supplemental O2: [place] Last BM Date : 08/02/23 SpO2: 93 % O2 Flow Rate (L/min): 12 L/min Filed Weights   08/02/23 0728 08/03/23 0300 08/04/23 0500  Weight: 79.8 kg 79.7 kg 78.7 kg    Intake/Output Summary (Last 24 hours) at 08/04/2023 1044 Last data filed at 08/04/2023 0801 Gross per 24 hour  Intake 557 ml  Output 1175 ml  Net -618 ml   Net IO Since Admission: 169 mL [08/04/23 1044]  Physical Exam General: NAD HENT: NCAT Lungs:  CTAB on anterior ausculatation Cardiovascular: IRIR, normal rate, good radial pulse, no LE edema Abdomen: No TTP of abdomen MSK: no asymmetry Skin: bruising on BUE, no hematoma noted Neuro: alert and oriented x4. Decreased strength in bilateral LE.   Diagnostics    Latest Ref Rng & Units 08/04/2023    2:40 AM 08/03/2023   10:02 PM 08/03/2023   12:48 PM  CBC  WBC 4.0 - 10.5 K/uL 19.6  21.3  18.5   Hemoglobin 12.0 - 15.0 g/dL 7.3  8.2  7.7   Hematocrit 36.0 - 46.0 % 22.1  24.3  22.9   Platelets 150 - 400 K/uL 185  216  190        Latest Ref Rng & Units 08/04/2023    2:40 AM 08/03/2023    3:05 AM 08/02/2023    3:17 AM  BMP  Glucose 70 - 99 mg/dL 562  130  865   BUN 8 - 23 mg/dL 86  784  696   Creatinine 0.44 - 1.00 mg/dL 2.95  2.84  1.32   Sodium 135 - 145 mmol/L 129  129  127    Potassium 3.5 - 5.1 mmol/L 4.1  4.2  5.0   Chloride 98 - 111 mmol/L 96  96  99   CO2 22 - 32 mmol/L 21  22  18    Calcium 8.9 - 10.3 mg/dL 8.0  8.0  7.9     Assessment/Plan: Principal Problem:   Acute hypoxic respiratory failure (HCC) Active Problems:   HLD (hyperlipidemia)   Hypothyroidism   Moderate protein-calorie malnutrition (HCC)   Congestive heart failure (HCC)   Chronic kidney disease   Normocytic anemia   Supratherapeutic INR   Urinary tract infection   Delirium  AHRF 2/2 CHF exacerbation: Resolving with IV lasix. Will give additional dose of IV lasix today and transition to home diuretic tomorrow. Volume status much improved and pt is on RA satting well. Creatinine improved to 1.76.  UTI/Leukocytosis: Currently on Day 4/5 of Rocephin. Will complete 5 day course. Has persistent leukocytosis. Given some delirium, some concern for aspiration PNA vs pneumonitis. Will get CXR as pt had some diuresis which  should have cleared her CXR from prior. Pt is afebrile but is on scheduled APAP so will discontinue that.   AKI/Hyponatremia: Improving. Suspect secondary to volume overload. Will add urine creatinine to previous labs.   Hx of Mitral Valve Replacement/Valvular Afib: On Warfarin. INR elevated so we are holding her warfarin currently. Improved to 3.4 today. Appreciate pharmacy's help with warfarin dosing.   Normocytic Anemia: Hgb 7.3, MCV 97. Some suspicion for blood loss give rapid decline but no signs of bleeding. Pt has IDA with Tsat of 8. RPI is 1.2 suggestive of inadequate response. Will give her IV iron to help replete her iron stores. Given some suspicion will repeat H/H in the afternoon. If continues to trend down, will investigate further. Given her advanced age and co-morbidities, there may be a limitation in advanced therapies.   Hospital Delirium: Waxing and waning nature. Delirium precautions in place. Discussed with family on delirium precautions to minimize delirium  as it portends a poor prognosis.   Hypothyroidism: on home levothyroxine.   Hip Osteoarthritis: Hip X ray shows worsening OA. Pt on Norco 5-325 mg prn, will discontinue APAP. Will continue to treat pain as it can lead to delirium itself.   Hx of COPD: continue duonebs. Hold home inhalers until pt becomes more alert and close to baseline.   HLD: Continue lipitor 80 mg.  Diet: Heart Healthy IVF: ,None VTE: Warfarin Code: DNR limited PT/OT recs: Pending. Prior to Admission Living Arrangement: Home Anticipated Discharge Location: Home Barriers to Discharge: Medical Stability Dispo: Anticipated discharge in approximately 4-5 day(s).   Gwenevere Abbot, MD Eligha Bridegroom. [facility] Internal Medicine Residency, PGY-3  Pager: [phone] After 5 pm and on weekends: Please call the on-call pager

## 2023-08-04 NOTE — Progress Notes (Signed)
Patient oxygen dropping down to 86%. Patient placed on 2L nasal cannula with humidification. SpO2 93%.

## 2023-08-04 NOTE — Plan of Care (Signed)
  Problem: Clinical Measurements: Goal: Will remain free from infection Outcome: Progressing Goal: Diagnostic test results will improve Outcome: Progressing Goal: Respiratory complications will improve Outcome: Progressing Goal: Cardiovascular complication will be avoided Outcome: Progressing   Problem: Elimination: Goal: Will not experience complications related to bowel motility Outcome: Progressing Goal: Will not experience complications related to urinary retention Outcome: Progressing   Problem: Pain Managment: Goal: General experience of comfort will improve and/or be controlled Outcome: Progressing   Problem: Safety: Goal: Ability to remain free from injury will improve Outcome: Progressing   Problem: Skin Integrity: Goal: Risk for impaired skin integrity will decrease Outcome: Progressing

## 2023-08-04 NOTE — Progress Notes (Signed)
PHARMACY - ANTICOAGULATION CONSULT NOTE  Pharmacy Consult for warfarin dosing. Indication: atrial fibrillation and mechanical prosthetic valve replacement requiring INR target range to be 2.5 - 3.5.  Allergies  Allergen Reactions   Neurontin [Gabapentin] Other (See Comments)    Weakness    Percocet [Oxycodone-Acetaminophen] Other (See Comments)    Percocet 5-325mg  dose too strong, OK to take Norco 5-325mg .    Patient Measurements: Height: 5\' 6"  (167.6 cm) Weight: 78.7 kg (173 lb 8 oz) IBW/kg (Calculated) : 59.3  Vital Signs: Temp: 96.5 F (35.8 C) (01/18 0745) Temp Source: Axillary (01/18 0745) BP: 136/50 (01/18 0745) Pulse Rate: 72 (01/18 0745)  Labs: Recent Labs    08/01/23 1239 08/01/23 1600 08/01/23 2015 08/02/23 0317 08/03/23 0305 08/03/23 1248 08/03/23 2202 08/04/23 0240  HGB 10.4*  --   --   --  8.1* 7.7* 8.2* 7.3*  HCT 30.9*  --   --   --  23.9* 22.9* 24.3* 22.1*  PLT 192  --   --   --  188 190 216 185  LABPROT  --   --    < > 33.0* 42.1*  --  36.4* 34.3*  INR  --   --    < > 3.2* 4.4*  --  3.6* 3.4*  CREATININE 2.12*  --   --  2.36* 1.99*  --   --  1.76*  TROPONINIHS 48* 44*  --   --   --   --   --   --    < > = values in this interval not displayed.    Estimated Creatinine Clearance: 21.6 mL/min (A) (by C-G formula based on SCr of 1.76 mg/dL (H)).   Medical History: Past Medical History:  Diagnosis Date   Arrhythmia    chronic atrial fib. with controlled rate ,on warfarin therapy   High cholesterol    Hypertension    Lower extremity edema    mild -stable   Mitral valve stenosis 10/1994   s/p St. Jude mechanical valve placement -49M-101 MODEL   Stroke (HCC) 08/2010   from office visit 2013- the setting of normal INR of 2.8 ;therefore increased goal to 3.5   Thyroid disease    hypothyroidism on synthroid    Medications:  Scheduled:   acetaminophen  1,000 mg Oral Q12H   albuterol  2.5 mg Nebulization BID   atorvastatin  80 mg Oral Daily    levothyroxine  100 mcg Oral QAC breakfast   Warfarin - Pharmacist Dosing Inpatient   Does not apply q1600    Assessment: 89YOF with h/o atrial fibrillation and mechanical mitral valve replacement on warfarin who presented with SOB. PTA warfarin dosing: 2 mg TThSaSun and 4 mg on MWF. Last warfarin dose 1/14. Pharmacy has been consulted to dose warfarin.    INR 3.4 today 1/18, down from 4.4 yesterday and just therapeutic for elevated INR goal. Hgb 7.3, plt 185 stable, down. No signs/symptoms of bleeding noted. Given sensitivity to warfarin will hold dose one more day and reassess tomorrow.  Goal of Therapy:  INR target range 2.5 - 3.5 Monitor platelets by anticoagulation protocol: Yes.   Plan:  Hold warfarin tonight Daily INR, CBC  Monitor for signs or symptoms of bleeding.   Stephenie Acres, PharmD PGY1 Pharmacy Resident 08/04/2023 8:02 AM

## 2023-08-04 NOTE — Progress Notes (Signed)
OT Cancellation Note  Patient Details Name: Tina Wade MRN: [id number] DOB: 1928/10/25   Cancelled Treatment:    Reason Eval/Treat Not Completed: Fatigue/lethargy limiting ability to participate (Pt with fatigue following PT eval limiting ability to participate in OT eval at this time. OT to reattempt to see pt for OT eval at a later time as appropriate/available.)  Loyda Costin "Ronaldo Miyamoto" M., OTR/L, MA Acute Rehab [phone]  Lendon Colonel 08/04/2023, 5:19 PM

## 2023-08-04 NOTE — Progress Notes (Signed)
Patient appears more alert compared to this morning. Iron infusing at this time.

## 2023-08-05 ENCOUNTER — Inpatient Hospital Stay (HOSPITAL_COMMUNITY): Payer: Medicare Other

## 2023-08-05 LAB — BASIC METABOLIC PANEL
Anion gap: 9 (ref 5–15)
BUN: 64 mg/dL — ABNORMAL HIGH (ref 8–23)
CO2: 26 mmol/L (ref 22–32)
Calcium: 7.9 mg/dL — ABNORMAL LOW (ref 8.9–10.3)
Chloride: 94 mmol/L — ABNORMAL LOW (ref 98–111)
Creatinine, Ser: 1.5 mg/dL — ABNORMAL HIGH (ref 0.44–1.00)
GFR, Estimated: 32 mL/min — ABNORMAL LOW (ref >60)
Glucose, Bld: 120 mg/dL — ABNORMAL HIGH (ref 70–99)
Potassium: 4.1 mmol/L (ref 3.5–5.1)
Sodium: 129 mmol/L — ABNORMAL LOW (ref 135–145)

## 2023-08-05 LAB — DIFFERENTIAL
Abs Immature Granulocytes: 1.82 10*3/uL — ABNORMAL HIGH (ref 0.00–0.07)
Basophils Absolute: 0.1 10*3/uL (ref 0.0–0.1)
Basophils Relative: 0 %
Eosinophils Absolute: 0.1 10*3/uL (ref 0.0–0.5)
Eosinophils Relative: 1 %
Immature Granulocytes: 9 %
Lymphocytes Relative: 4 %
Lymphs Abs: 0.9 10*3/uL (ref 0.7–4.0)
Monocytes Absolute: 1.9 10*3/uL — ABNORMAL HIGH (ref 0.1–1.0)
Monocytes Relative: 9 %
Neutro Abs: 15.7 10*3/uL — ABNORMAL HIGH (ref 1.7–7.7)
Neutrophils Relative %: 77 %
Smear Review: NORMAL

## 2023-08-05 LAB — TECHNOLOGIST SMEAR REVIEW: Plt Morphology: NORMAL

## 2023-08-05 LAB — CBC
HCT: 23.2 % — ABNORMAL LOW (ref 36.0–46.0)
Hemoglobin: 7.6 g/dL — ABNORMAL LOW (ref 12.0–15.0)
MCH: 32.8 pg (ref 26.0–34.0)
MCHC: 32.8 g/dL (ref 30.0–36.0)
MCV: 100 fL (ref 80.0–100.0)
Platelets: 221 10*3/uL (ref 150–400)
RBC: 2.32 MIL/uL — ABNORMAL LOW (ref 3.87–5.11)
RDW: 18.3 % — ABNORMAL HIGH (ref 11.5–15.5)
WBC: 19.8 10*3/uL — ABNORMAL HIGH (ref 4.0–10.5)
nRBC: 4.9 % — ABNORMAL HIGH (ref 0.0–0.2)

## 2023-08-05 LAB — PROTIME-INR
INR: 2.8 — ABNORMAL HIGH (ref 0.8–1.2)
Prothrombin Time: 29.9 s — ABNORMAL HIGH (ref 11.4–15.2)

## 2023-08-05 MED ORDER — GERHARDT'S BUTT CREAM
TOPICAL_CREAM | Freq: Every day | CUTANEOUS | Status: DC
Start: 2023-08-05 — End: 2023-08-09
  Filled 2023-08-05: qty 60

## 2023-08-05 MED ORDER — CALCIUM GLUCONATE-NACL 1-0.675 GM/50ML-% IV SOLN
1.0000 g | Freq: Once | INTRAVENOUS | Status: AC
Start: 2023-08-05 — End: 2023-08-05
  Administered 2023-08-05: 1000 mg via INTRAVENOUS
  Filled 2023-08-05: qty 50

## 2023-08-05 MED ORDER — DICLOFENAC SODIUM 1 % EX GEL
2.0000 g | Freq: Two times a day (BID) | CUTANEOUS | Status: DC
  Administered 2023-08-05 – 2023-08-09 (×8): 2 g via TOPICAL
  Filled 2023-08-05: qty 100

## 2023-08-05 MED ORDER — SODIUM CHLORIDE 3 % IN NEBU
4.0000 mL | INHALATION_SOLUTION | Freq: Two times a day (BID) | RESPIRATORY_TRACT | Status: AC
  Administered 2023-08-05 – 2023-08-07 (×6): 4 mL via RESPIRATORY_TRACT
  Filled 2023-08-05 (×6): qty 4

## 2023-08-05 MED ORDER — WARFARIN SODIUM 2 MG PO TABS
2.0000 mg | ORAL_TABLET | Freq: Once | ORAL | Status: AC
  Administered 2023-08-05: 2 mg via ORAL
  Filled 2023-08-05: qty 1

## 2023-08-05 MED ORDER — FUROSEMIDE 40 MG PO TABS
40.0000 mg | ORAL_TABLET | Freq: Two times a day (BID) | ORAL | Status: DC
  Administered 2023-08-05 – 2023-08-09 (×9): 40 mg via ORAL
  Filled 2023-08-05 (×9): qty 1

## 2023-08-05 NOTE — Progress Notes (Signed)
Monitor alarming- SpO2 79-80% on room air. Patient placed back on 2L nasal cannula, SpO2 93

## 2023-08-05 NOTE — Progress Notes (Signed)
PHARMACY - ANTICOAGULATION CONSULT NOTE  Pharmacy Consult for warfarin dosing. Indication: atrial fibrillation and mechanical prosthetic valve replacement requiring INR target range to be 2.5 - 3.5.  Allergies  Allergen Reactions   Neurontin [Gabapentin] Other (See Comments)    Weakness    Percocet [Oxycodone-Acetaminophen] Other (See Comments)    Percocet 5-325mg  dose too strong, OK to take Norco 5-325mg .    Patient Measurements: Height: 5\' 6"  (167.6 cm) Weight: 82.6 kg (182 lb 1.6 oz) IBW/kg (Calculated) : 59.3  Vital Signs: Temp: 97.9 F (36.6 C) (01/19 0737) Temp Source: Oral (01/19 0737) BP: 117/43 (01/19 0737) Pulse Rate: 71 (01/19 0737)  Labs: Recent Labs    08/03/23 0305 08/03/23 1248 08/03/23 2202 08/04/23 0240 08/04/23 1130 08/04/23 2213 08/05/23 0238  HGB 8.1*   < > 8.2* 7.3* 7.3* 8.0* 7.6*  HCT 23.9*   < > 24.3* 22.1* 22.4* 24.2* 23.2*  PLT 188   < > 216 185  --   --  221  LABPROT 42.1*  --  36.4* 34.3*  --   --  29.9*  INR 4.4*  --  3.6* 3.4*  --   --  2.8*  CREATININE 1.99*  --   --  1.76*  --   --  1.50*   < > = values in this interval not displayed.    Estimated Creatinine Clearance: 25.9 mL/min (A) (by C-G formula based on SCr of 1.5 mg/dL (H)).   Medical History: Past Medical History:  Diagnosis Date   Arrhythmia    chronic atrial fib. with controlled rate ,on warfarin therapy   High cholesterol    Hypertension    Lower extremity edema    mild -stable   Mitral valve stenosis 10/1994   s/p St. Jude mechanical valve placement -36M-101 MODEL   Stroke (HCC) 08/2010   from office visit 2013- the setting of normal INR of 2.8 ;therefore increased goal to 3.5   Thyroid disease    hypothyroidism on synthroid    Medications:  Scheduled:   atorvastatin  80 mg Oral Daily   furosemide  40 mg Oral BID   ipratropium-albuterol  3 mL Nebulization BID   levothyroxine  100 mcg Oral QAC breakfast   sodium chloride HYPERTONIC  4 mL Nebulization BID    Warfarin - Pharmacist Dosing Inpatient   Does not apply q1600    Assessment: 89YOF with h/o atrial fibrillation and mechanical mitral valve replacement on warfarin who presented with SOB. PTA warfarin dosing: 2 mg TThSaSun and 4 mg on MWF. Last warfarin dose 1/14. Pharmacy has been consulted to dose warfarin.   INR 2.8 today 1/19, down from 3.4 yesterday and therapeutic for elevated INR goal. Hgb 7.6, plt 221 stable. Poor appetite, but no signs/symptoms of bleeding noted.   Goal of Therapy:  INR target range 2.5 - 3.5 Monitor platelets by anticoagulation protocol: Yes.   Plan:  Warfarin 2 mg x1 tonight Daily INR, CBC  Monitor for signs or symptoms of bleeding Monitor for DDIs and changes in appetite  Stephenie Acres, PharmD PGY1 Pharmacy Resident 08/05/2023 11:00 AM

## 2023-08-05 NOTE — Progress Notes (Signed)
Inpatient Rehab Admissions Coordinator:   Per therapy recommendations, patient was screened for CIR candidacy by Clemens Catholic, MS, CCC-SLP . At this time, Pt. is not at a level to tolerate the intensity of CIR; however,   Pt. may have potential to progress to becoming a potential CIR candidate, so CIR admissions team will follow and monitor for progress and participation with therapies and place consult order if Pt. appears to be an appropriate candidate. Please contact me with any questions.    Clemens Catholic, [place], [place] Admissions Coordinator  [phone] ([place]) [phone] (office)

## 2023-08-05 NOTE — Progress Notes (Signed)
Subjective Denies SOB, chest pain, dysuria.   Physical exam Blood pressure (!) 125/46, pulse 78, temperature 98.1 F (36.7 C), temperature source Oral, resp. rate 17, height 5\' 6"  (1.676 m), weight 82.6 kg, SpO2 98%.  Physical Exam: Constitutional: lying in bed, in no acute distress Cardiovascular: regular rate and rhythm, no m/r/g, no LEE Pulmonary/Chest: normal work of breathing on room air, CTAB Abdominal: soft, non-tender, non-distended Neurological: alert & oriented x person, place, year (not month), and situation Skin: warm and dry, scattered ecchymosis throughout U/LE (unchanged)   Weight change: 3.9 kg   Intake/Output Summary (Last 24 hours) at 08/05/2023 0709 Last data filed at 08/05/2023 0359 Gross per 24 hour  Intake 649.29 ml  Output 1200 ml  Net -550.71 ml   Net IO Since Admission: -481.71 mL [08/05/23 0709]  Labs, images, and other studies    Latest Ref Rng & Units 08/05/2023    2:38 AM 08/04/2023   10:13 PM 08/04/2023   11:30 AM  CBC  WBC 4.0 - 10.5 K/uL 19.8     Hemoglobin 12.0 - 15.0 g/dL 7.6  8.0  7.3   Hematocrit 36.0 - 46.0 % 23.2  24.2  22.4   Platelets 150 - 400 K/uL 221          Latest Ref Rng & Units 08/05/2023    2:38 AM 08/04/2023    2:40 AM 08/03/2023    3:05 AM  BMP  Glucose 70 - 99 mg/dL 960  454  098   BUN 8 - 23 mg/dL 64  86  119   Creatinine 0.44 - 1.00 mg/dL 1.47  8.29  5.62   Sodium 135 - 145 mmol/L 129  129  129   Potassium 3.5 - 5.1 mmol/L 4.1  4.1  4.2   Chloride 98 - 111 mmol/L 94  96  96   CO2 22 - 32 mmol/L 26  21  22    Calcium 8.9 - 10.3 mg/dL 7.9  8.0  8.0      Assessment and plan Hospital day 4  Tina Wade is a 89 y.o.female with a 71-pack year history and past medical history significant for mitral valve replacement and prior L hip surgery presenting with difficulty getting up from her chair today and SOB and admitted for acute hypoxic respiratory failure .   AHRF 2/2 CHF exacerbation: Increased  O2 requirements overnight as patient dropped to 86%. Today patient saturating 99% so O2 turned off in attempt to wean down to RA but unfortunately patient's O2 dropped again and 2 L [place] replaced. Repeat CXR today unremarkable. Transitioned to home po Lasix of 40 BID. No signs of volume overload.  Patient is -1 L since admission. Lungs sound clear but upper airway congestion/secretions noted so will order hypertonic saline nebs. -Trend O2 requirement/volume status   UTI/Leukocytosis: Currently on Day 5/5 of Rocephin. Has persistent leukocytosis with unclear etiology. Smear ordered today which showed mild left shift and basophilic stippling. Afebrile. -Trend CBC   AKI: Creatinine today improved to 1.50 and essentially at baseline. Pre-renal in the setting of volume overload but patient also received IV Toradol in ED 1/11 for hip pain prior to 1/15 admission.   Hx of Mechanical Mitral Valve Replacement/Valvular A-fib: On Warfarin with Pharmacy managing. INR has come down after holding Warfarin and is now inside therapeutic range of 2.5-3.5 as of today.   Normocytic Anemia: Hgb 7.6. Some  suspicion for blood loss give rapid decline but no signs of bleeding and INR has stablized. Pt has IDA with Tsat of 8. RPI is 1.2 suggestive of inadequate response. IV iron yesterday. Given her advanced age and co-morbidities, there may be a limitation in advanced therapies.  Smear showed left shift with basophilic stippling. -Trend CBC    Hospital Delirium: Waxing and waning nature. Delirium precautions in place. Discussed with family on delirium precautions to minimize delirium as it portends a poor prognosis.    Hypothyroidism: on home levothyroxine.    Hip Osteoarthritis: Hip X ray shows worsening OA. Pt on Norco 5-325 mg prn. Will continue to treat pain as it can lead to delirium itself.    Hx of COPD: continue duonebs. Hold home inhalers until pt becomes more alert and close to baseline.    HLD: Continue  lipitor 80 mg.  Disposition: Extensive conversation with daughter, Diannia Ruder, on the phone today discussing goals of care.  Daughter worried that she would not be able to take care of her if she were to come home given her current state. -Consult to palliative care for further help managing disposition  Diet: HH VTE:  Warfarin Code: DNR Limited PT: CIR vs SNF  Carmina Miller, DO 08/05/2023, 7:09 AM  Pager: [phone] After 5pm or weekend: [phone]

## 2023-08-05 NOTE — Evaluation (Signed)
Occupational Therapy Evaluation Patient Details Name: Tina Wade MRN: [id number] DOB: September 24, 1928 Today's Date: 08/05/2023   History of Present Illness 89 y.o. female who presented on 08/01/23 with  SOB, acute hypoxic respiratory failure, and CHF. Imaging: Progressive and advanced osteoarthritis of the left hip, osteopenia/osteoporosis, and   aortic atherosclerosis. Of note, pt had PNA 2-3 weeks ago, chronic L hip pain secondary to proximal femur fx in 2020, and recent UTI. PMHx: arrhythmia, high cholesterol, HTN, lower extremity edema, mitral valve stenosis, CVA, hypothyroid diease.   Clinical Impression   At baseline, pt completes ADLs Mod I and functional transfers/mobility with a Rollator with Mod I. At baseline, pt receives assistance from family for IADLs. Pt now presents with significantly decreased activity tolerance, B UE generalized weakness, pain affecting functional level, lethargy, and decreased safety and independence with functional tasks. Pt currently demonstrates ability to complete UB ADLs with Set up-Supervision to Max assist and LB ADLs with Total assist +2. Pt VSS on 2L continuous O2 through nasal cannula throughout session. Pt participated well from bed level but limited by fatigue, lethargy, and pain with pt declining bed mobility and OOB activity this session. Pt will benefit from acute skilled OT services to address deficits outlined below, decrease caregiver burden, and increase safety and independence with functional tasks. Post acute discharge, pt will benefit from intensive inpatient skilled rehab services < 3 hours per day to maximize rehab potential. Pt and family report preference for pt returning home with family assist. If pt discharges home, she will benefit from continued skilled OT services in the home to maximize rehab potential and for additional family training.       If plan is discharge home, recommend the following: Two people to help with walking and/or  transfers;Two people to help with bathing/dressing/bathroom;Assistance with cooking/housework;Assistance with feeding;Assist for transportation;Help with stairs or ramp for entrance    Functional Status Assessment  Patient has had a recent decline in their functional status and demonstrates the ability to make significant improvements in function in a reasonable and predictable amount of time.  Equipment Recommendations  Hospital bed;Hoyer lift;Wheelchair (measurements OT);Wheelchair cushion (measurements OT)    Recommendations for Other Services       Precautions / Restrictions Precautions Precautions: Fall Restrictions Weight Bearing Restrictions Per Provider Order: No      Mobility Bed Mobility Overal bed mobility: Needs Assistance             General bed mobility comments: Pt declined addressing bed mobility this day due to fatigue and pt reported increased pain in L hip with movement. Per chart review of PT session on 1/18 and discussion with RN this day, pt requiring Max to Total assist +2 for bed mobility at this time.    Transfers                   General transfer comment: deferred per pt request due to pain and fatigue today      Balance                                           ADL either performed or assessed with clinical judgement   ADL Overall ADL's : Needs assistance/impaired Eating/Feeding: Set up;Supervision/ safety;Bed level (with HOB elevated; with increased time) Eating/Feeding Details (indicate cue type and reason): with cup with lid and straw and finger foods Grooming: Set up;Minimal  assistance;Bed level (with HOB elevated; with increased time)   Upper Body Bathing: Maximal assistance;Bed level;Cueing for compensatory techniques (with increased time)   Lower Body Bathing: Total assistance;+2 for physical assistance;Bed level   Upper Body Dressing : Maximal assistance;Cueing for compensatory techniques;Bed level (with  increased time)   Lower Body Dressing: Total assistance;+2 for physical assistance;Bed level     Toilet Transfer Details (indicate cue type and reason): deferred this session Toileting- Clothing Manipulation and Hygiene: Total assistance;+2 for physical assistance;Bed level         General ADL Comments: Pt with significantly decreased activity tolerance and fatiguing quickly with minimal activity from bed level.     Vision Baseline Vision/History: 1 Wears glasses (readers) Ability to See in Adequate Light: 0 Adequate (with glasses) Patient Visual Report: No change from baseline       Perception         Praxis         Pertinent Vitals/Pain Pain Assessment Pain Assessment: Faces Faces Pain Scale: Hurts little more Pain Location: L hip at rest Pain Descriptors / Indicators: Sore, Grimacing, Guarding Pain Intervention(s): Limited activity within patient's tolerance, Monitored during session, Repositioned     Extremity/Trunk Assessment Upper Extremity Assessment Upper Extremity Assessment: Left hand dominant;Generalized weakness   Lower Extremity Assessment Lower Extremity Assessment: Defer to PT evaluation   Cervical / Trunk Assessment Cervical / Trunk Assessment: Normal   Communication Communication Communication: No apparent difficulties (soft voice)   Cognition Arousal: Lethargic Behavior During Therapy: WFL for tasks assessed/performed, Flat affect (Largely WFL with mildly flat affect) Overall Cognitive Status: Within Functional Limits for tasks assessed                                 General Comments: Pt lethargic but oriented x4 and demonstrating ability to follow 2-step instructions consistently. Pt also clearly communicating wants and needs and appropriately ingaging in conversation on topics such as reading, places she's lived, family, and prior career throughot session.     General Comments  VSS on 2L continuous O2 through nasal cannula  throughout session. Pt's son present during a portion of session.    Exercises Exercises: General Upper Extremity, Hand exercises General Exercises - Upper Extremity Shoulder Flexion: AROM, AAROM, Strengthening, Both, 10 reps, Supine (with HOB elevated; to maintain joint integrity and ROM, increased activity tolerance) Shoulder Extension: AROM, AAROM, Strengthening, Both, 10 reps, Supine (with HOB elevated; to maintain joint integrity and ROM, increased activity tolerance) Shoulder Horizontal ABduction: AROM, AAROM, Strengthening, Both, 10 reps, Supine (with HOB elevated; to maintain joint integrity and ROM, increased activity tolerance) Shoulder Horizontal ADduction: AROM, AAROM, Strengthening, Both, 10 reps, Supine (with HOB elevated; to maintain joint integrity and ROM, increased activity tolerance) Elbow Flexion: AROM, Strengthening, Both, 10 reps, Supine (with HOB elevated; to maintain joint integrity and ROM, increased activity tolerance) Elbow Extension: AROM, AAROM, Strengthening, Both, 10 reps, Supine (with HOB elevated; to maintain joint integrity and ROM, increased activity tolerance) Wrist Flexion: AROM, AAROM, Strengthening, Both, 10 reps, Supine (with HOB elevated; to maintain joint integrity and ROM, increased activity tolerance) Wrist Extension: AROM, Strengthening, Both, 10 reps, Supine (with HOB elevated; to maintain joint integrity and ROM, increased activity tolerance) Digit Composite Flexion: AROM, Strengthening, Both, 10 reps, Supine (with HOB elevated; to maintain joint integrity and ROM, increased activity tolerance) Composite Extension: AROM, Strengthening, Both, 10 reps, Supine (with HOB elevated; to maintain joint integrity and ROM, increased  activity tolerance) Hand Exercises Wrist Ulnar Deviation: AROM, Both, 10 reps, Supine (with HOB elevated; to maintain joint integrity and ROM, increased activity tolerance) Wrist Radial Deviation: AROM, Strengthening, Both, 10 reps,  Supine (with HOB elevated; to maintain joint integrity and ROM, increased activity tolerance) Opposition: AROM, Both, 5 reps, Supine, Strengthening (with HOB elevated; to maintain joint integrity and ROM, increased activity tolerance)   Shoulder Instructions      Home Living Family/patient expects to be discharged to:: Private residence Living Arrangements: Children (daughter) Available Help at Discharge: Family;Available 24 hours/day (Pt's family interested in getting home health aid to help them care for the pt around the home.) Type of Home: House Home Access: Other (comment);Level entry (threshold to step over) Entrance Stairs-Number of Steps: 1 (threshold) Entrance Stairs-Rails: None Home Layout: One level     Bathroom Shower/Tub: Producer, television/film/video: Standard     Home Equipment: Grab bars - tub/shower;Hand held shower head;Shower seat;Rollator (4 wheels);Cane - single point;BSC/3in1;Transport chair;Lift chair;Grab bars - toilet   Additional Comments: 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 She's originally from [place] and enjoys reading mysteries.      Prior Functioning/Environment Prior Level of Function : Independent/Modified Independent             Mobility Comments: At baseline, pt requires Mod I for bed mobility with increased time for ambulation and transfers using Rollator. ADLs Comments: At baseline, pt completes ADLs with Mod I and receives assistance from family for IADLs.        OT Problem List: Decreased strength;Decreased activity tolerance      OT Treatment/Interventions: Self-care/ADL training;Therapeutic exercise;DME and/or AE instruction;Therapeutic activities;Patient/family education;Balance training    OT Goals(Current goals can be found in the care plan section) Acute Rehab OT Goals Patient Stated Goal: to return home and not have pain OT Goal Formulation: With patient/family Time For Goal Achievement:  08/19/23 Potential to Achieve Goals: Fair ADL Goals Pt Will Perform Grooming: with set-up;sitting (sitting at EOB with Fair balance for 5 or more minutes) Pt Will Perform Upper Body Bathing: with min assist;sitting Pt Will Perform Upper Body Dressing: with contact guard assist;sitting Pt Will Transfer to Toilet: with mod assist;ambulating;bedside commode (with least restrictive AD) Pt Will Perform Toileting - Clothing Manipulation and hygiene: with mod assist;sitting/lateral leans;sit to/from stand Pt/caregiver will Perform Home Exercise Program: Increased strength;Both right and left upper extremity;With written HEP provided;With Supervision (AAROM progressing to AROM; Maintain ROM; Increased activity tolerance)  OT Frequency: Min 1X/week    Co-evaluation              AM-PAC OT "6 Clicks" Daily Activity     Outcome Measure Help from another person eating meals?: A Little Help from another person taking care of personal grooming?: A Little Help from another person toileting, which includes using toliet, bedpan, or urinal?: Total Help from another person bathing (including washing, rinsing, drying)?: A Lot Help from another person to put on and taking off regular upper body clothing?: A Lot Help from another person to put on and taking off regular lower body clothing?: Total 6 Click Score: 12   End of Session Equipment Utilized During Treatment: Oxygen Nurse Communication: Mobility status;Other (comment) (Per RN, pt with noted medical decline over past day with pt noted to have significant decrease in activity tolerance.)  Activity Tolerance: Patient limited by fatigue;Patient limited by lethargy;Patient limited by pain Patient left: in bed;with call bell/phone within reach;with bed alarm set;with family/visitor present  OT Visit Diagnosis: Muscle weakness (generalized) (M62.81);Other (comment) (decreased activity tolerance)                Time: 5956-3875 OT Time Calculation  (min): 27 min Charges:  OT General Charges $OT Visit: 1 Visit OT Evaluation $OT Eval Moderate Complexity: 1 Mod OT Treatments $Therapeutic Exercise: 8-22 mins  Raiven Belizaire "Orson Eva., OTR/L, MA Acute Rehab [phone]  Lendon Colonel 08/05/2023, 3:04 PM

## 2023-08-06 DIAGNOSIS — J9601 Acute respiratory failure with hypoxia: Secondary | ICD-10-CM | POA: Diagnosis not present

## 2023-08-06 DIAGNOSIS — N390 Urinary tract infection, site not specified: Secondary | ICD-10-CM | POA: Diagnosis not present

## 2023-08-06 DIAGNOSIS — I509 Heart failure, unspecified: Secondary | ICD-10-CM | POA: Diagnosis not present

## 2023-08-06 DIAGNOSIS — E876 Hypokalemia: Secondary | ICD-10-CM

## 2023-08-06 DIAGNOSIS — M25552 Pain in left hip: Secondary | ICD-10-CM | POA: Diagnosis not present

## 2023-08-06 LAB — CBC
HCT: 23.5 % — ABNORMAL LOW (ref 36.0–46.0)
Hemoglobin: 7.4 g/dL — ABNORMAL LOW (ref 12.0–15.0)
MCH: 31.9 pg (ref 26.0–34.0)
MCHC: 31.5 g/dL (ref 30.0–36.0)
MCV: 101.3 fL — ABNORMAL HIGH (ref 80.0–100.0)
Platelets: 226 10*3/uL (ref 150–400)
RBC: 2.32 MIL/uL — ABNORMAL LOW (ref 3.87–5.11)
RDW: 18.8 % — ABNORMAL HIGH (ref 11.5–15.5)
WBC: 15.8 10*3/uL — ABNORMAL HIGH (ref 4.0–10.5)
nRBC: 2 % — ABNORMAL HIGH (ref 0.0–0.2)

## 2023-08-06 LAB — PROTIME-INR
INR: 2.4 — ABNORMAL HIGH (ref 0.8–1.2)
Prothrombin Time: 26.2 s — ABNORMAL HIGH (ref 11.4–15.2)

## 2023-08-06 LAB — BASIC METABOLIC PANEL
Anion gap: 10 (ref 5–15)
BUN: 49 mg/dL — ABNORMAL HIGH (ref 8–23)
CO2: 26 mmol/L (ref 22–32)
Calcium: 8.1 mg/dL — ABNORMAL LOW (ref 8.9–10.3)
Chloride: 95 mmol/L — ABNORMAL LOW (ref 98–111)
Creatinine, Ser: 1.34 mg/dL — ABNORMAL HIGH (ref 0.44–1.00)
GFR, Estimated: 37 mL/min — ABNORMAL LOW (ref >60)
Glucose, Bld: 106 mg/dL — ABNORMAL HIGH (ref 70–99)
Potassium: 3.4 mmol/L — ABNORMAL LOW (ref 3.5–5.1)
Sodium: 131 mmol/L — ABNORMAL LOW (ref 135–145)

## 2023-08-06 LAB — MAGNESIUM: Magnesium: 2.4 mg/dL (ref 1.7–2.4)

## 2023-08-06 LAB — FOLATE: Folate: 14.6 ng/mL (ref >5.9)

## 2023-08-06 MED ORDER — SENNOSIDES-DOCUSATE SODIUM 8.6-50 MG PO TABS
1.0000 | ORAL_TABLET | Freq: Two times a day (BID) | ORAL | Status: DC
Start: 2023-08-06 — End: 2023-08-08
  Administered 2023-08-06 – 2023-08-08 (×4): 1 via ORAL
  Filled 2023-08-06 (×4): qty 1

## 2023-08-06 MED ORDER — WARFARIN SODIUM 2 MG PO TABS
4.0000 mg | ORAL_TABLET | Freq: Once | ORAL | Status: AC
  Administered 2023-08-06: 4 mg via ORAL
  Filled 2023-08-06: qty 2

## 2023-08-06 MED ORDER — POTASSIUM CHLORIDE 20 MEQ PO PACK
40.0000 meq | PACK | Freq: Once | ORAL | Status: AC
  Administered 2023-08-06: 40 meq via ORAL
  Filled 2023-08-06: qty 2

## 2023-08-06 NOTE — Progress Notes (Signed)
   08/06/23 0906  Spiritual Encounters  Type of Visit Declined chaplain visit  Conversation partners present during encounter Nurse  Referral source Nurse (RN/NT/LPN)  Reason for visit Goals of care meeting  OnCall Visit No    Per consult notes, pt declines visit with chaplain. Requesting priest. Ivar Bury called number in consult note and confirmed with family that priest was being requested. Chaplain called priest and requested visit - left voicemail.  Milus Height Chaplain Intern [phone]

## 2023-08-06 NOTE — Progress Notes (Signed)
Found patient sating 89% on RA sitting up in the bed. Place patient on 1LNC Humidified due to dried blood noted in nose. Order states keep sats >94%. Patient currently sating at 98% on 1LNC. Please advise.

## 2023-08-06 NOTE — Progress Notes (Signed)
PHARMACY - ANTICOAGULATION CONSULT NOTE  Pharmacy Consult for warfarin dosing. Indication: atrial fibrillation and mechanical prosthetic valve replacement requiring INR target range to be 2.5 - 3.5.   Allergies  Allergen Reactions   Neurontin [Gabapentin] Other (See Comments)    Weakness    Percocet [Oxycodone-Acetaminophen] Other (See Comments)    Percocet 5-325mg  dose too strong, OK to take Norco 5-325mg .    Patient Measurements: Height: 5\' 6"  (167.6 cm) Weight: 84.7 kg (186 lb 11.7 oz) IBW/kg (Calculated) : 59.3  Vital Signs: Temp: 98.2 F (36.8 C) (01/20 0759) Temp Source: Oral (01/20 0759) BP: 112/40 (01/20 0759) Pulse Rate: 77 (01/20 0759)  Labs: Recent Labs    08/04/23 0240 08/04/23 1130 08/04/23 2213 08/05/23 0238 08/06/23 0301  HGB 7.3*   < > 8.0* 7.6* 7.4*  HCT 22.1*   < > 24.2* 23.2* 23.5*  PLT 185  --   --  221 226  LABPROT 34.3*  --   --  29.9* 26.2*  INR 3.4*  --   --  2.8* 2.4*  CREATININE 1.76*  --   --  1.50* 1.34*   < > = values in this interval not displayed.    Estimated Creatinine Clearance: 29.4 mL/min (A) (by C-G formula based on SCr of 1.34 mg/dL (H)).   Medical History: Past Medical History:  Diagnosis Date   Arrhythmia    chronic atrial fib. with controlled rate ,on warfarin therapy   High cholesterol    Hypertension    Lower extremity edema    mild -stable   Mitral valve stenosis 10/1994   s/p St. Jude mechanical valve placement -65M-101 MODEL   Stroke (HCC) 08/2010   from office visit 2013- the setting of normal INR of 2.8 ;therefore increased goal to 3.5   Thyroid disease    hypothyroidism on synthroid    Medications:  Scheduled:   atorvastatin  80 mg Oral Daily   diclofenac Sodium  2 g Topical BID   furosemide  40 mg Oral BID   Gerhardt's butt cream   Topical Daily   ipratropium-albuterol  3 mL Nebulization BID   levothyroxine  100 mcg Oral QAC breakfast   potassium chloride  40 mEq Oral Once   sodium chloride  HYPERTONIC  4 mL Nebulization BID   Warfarin - Pharmacist Dosing Inpatient   Does not apply q1600    Assessment: 89YOF with h/o atrial fibrillation and mechanical mitral valve replacement on warfarin who presented with SOB. PTA warfarin dosing: 2 mg TThSaSun and 4 mg on MWF. Last warfarin dose 1/14. Pharmacy has been consulted to dose warfarin. INR today is 3.2 (therapeutic within target range goal of 2.5 - 3.5. Hemoglobin down-trending past 2 days, 7.4 gm/dL today. PTLC stable.  Goal of Therapy:  INR target range for the indication of mechanical mitral valve is INR 2.5 - 3.5. Today's value is 2.4. Will give 4 mg as per her at home dosing regimen. Monitor platelets by anticoagulation protocol: Yes   Plan:  Give 4 mg warfarin today at 1600. Daily INRs, follow PLTC, Hemoglobin and any signs or symptoms of blood loss or embolic disease.   Elicia Lamp, PharmD, CPP 08/06/2023,8:05 AM

## 2023-08-06 NOTE — TOC Progression Note (Signed)
Transition of Care [facility]) - Progression Note    Patient Details  Name: Tina Wade MRN: [id number] Date of Birth: October 14, 1928  Transition of [facility]) CM/SW Contact  Michaela Corner, [place] Phone Number: 08/06/2023, 2:55 PM  Clinical Narrative:   CSW spoke with pts dtr, Diannia Ruder, about backup plan for SNF. At this time, Diannia Ruder would like to wait until meeting with Palliative to review options for dc plan before making a decision. Diannia Ruder asked to speak with RNCM. CSW notified RNCM.   TOC will continue to follow.    Expected Discharge Plan: Home w Home Health Services Barriers to Discharge: Continued Medical Work up  Expected Discharge Plan and Services In-house Referral: NA Discharge Planning Services: CM Consult Post Acute Care Choice: NA Living arrangements for the past 2 months: Single Family Home                 DME Arranged: N/A DME Agency: NA       HH Arranged: NA           Social Determinants of Health (SDOH) Interventions SDOH Screenings   Food Insecurity: No Food Insecurity (08/02/2023)  Housing: Low Risk  (08/02/2023)  Transportation Needs: No Transportation Needs (08/02/2023)  Utilities: Not At Risk (08/02/2023)  Financial Resource Strain: Low Risk  (07/23/2023)   Received from [facility]  Physical Activity: Unknown (09/12/2022)   Received from [facility]  Social Connections: Moderately Isolated (08/02/2023)  Stress: No Stress Concern Present (05/06/2023)   Received from [facility]  Tobacco Use: High Risk (08/01/2023)    Readmission Risk Interventions     No data to display

## 2023-08-06 NOTE — Progress Notes (Signed)
Subjective SOB "the same" but overall doing okay.  Physical exam Blood pressure (!) 121/46, pulse 81, temperature 98.2 F (36.8 C), temperature source Oral, resp. rate 18, height 5\' 6"  (1.676 m), weight 84.7 kg, SpO2 96%.  Constitutional: lying in bed, in no acute distress Cardiovascular: regular rate and rhythm, no m/r/g, no LEE Pulmonary/Chest: normal work of breathing on room air, CTAB Abdominal: soft, non-tender, non-distended Neurological: alert & oriented x person, place, year (not month), and situation Skin: warm and dry, scattered ecchymosis throughout U/LE (unchanged)  Weight change: 2.1 kg   Intake/Output Summary (Last 24 hours) at 08/06/2023 1349 Last data filed at 08/06/2023 0953 Gross per 24 hour  Intake 250.77 ml  Output 1000 ml  Net -749.23 ml   Net IO Since Admission: -1,800.94 mL [08/06/23 1349]  Labs, images, and other studies    Latest Ref Rng & Units 08/06/2023    3:01 AM 08/05/2023    2:38 AM 08/04/2023   10:13 PM  CBC  WBC 4.0 - 10.5 K/uL 15.8  19.8    Hemoglobin 12.0 - 15.0 g/dL 7.4  7.6  8.0   Hematocrit 36.0 - 46.0 % 23.5  23.2  24.2   Platelets 150 - 400 K/uL 226  221         Latest Ref Rng & Units 08/06/2023    3:01 AM 08/05/2023    2:38 AM 08/04/2023    2:40 AM  BMP  Glucose 70 - 99 mg/dL 829  562  130   BUN 8 - 23 mg/dL 49  64  86   Creatinine 0.44 - 1.00 mg/dL 8.65  7.84  6.96   Sodium 135 - 145 mmol/L 131  129  129   Potassium 3.5 - 5.1 mmol/L 3.4  4.1  4.1   Chloride 98 - 111 mmol/L 95  94  96   CO2 22 - 32 mmol/L 26  26  21    Calcium 8.9 - 10.3 mg/dL 8.1  7.9  8.0      Assessment and plan Hospital day 5  Tina Wade is a 89 y.o.female with a 71-pack year history and past medical history significant for mitral valve replacement and prior L hip surgery presenting with difficulty getting up from her chair today and SOB and admitted for acute hypoxic respiratory failure .    AHRF 2/2 CHF exacerbation: Today  patient saturating in 90's on 1 L [place]. Wean attempt unsuccessful again today as patient's O2 drops to 80's on RA. Transitioned to home po Lasix of 40 BID. No signs of volume overload.  Patient is -1.5 L since admission. Lungs sound clear. -Trend O2 requirement/volume status   UTI/Leukocytosis: 5 day Rocephin course completed today. Leukocytosis improved to 15.8. Smear ordered yesterday which showed mild left shift and basophilic stippling. Afebrile. -Trend CBC  Hx of Mechanical Mitral Valve Replacement/Valvular A-fib: On Warfarin with Pharmacy managing. INR has come down after holding Warfarin and is now 2.4 with range of 2.5-3.5. -Trend INR   Normocytic Anemia: Stable Hgb 7.4. MCV increased to 101. B12 was wnl and Folate today wnl.  Pt has IDA with Tsat of 8. RPI is 1.2 suggestive of inadequate response. IV iron 1/18.. Given her advanced age and co-morbidities, there may be a limitation in advanced therapies.  Smear showed left shift with basophilic stippling. -Trend CBC    Hospital Delirium: Waxing and waning nature. Delirium precautions in  place. Discussed with family on delirium precautions to minimize delirium as it portends a poor prognosis.    Hypothyroidism: on home levothyroxine.    Hip Osteoarthritis: Hip X ray shows worsening OA. Pt on Norco 5-325 mg prn. Will continue to treat pain as it can lead to delirium itself. Added Voltaren gel yesterday.   Hx of COPD: continue duonebs. Hold home inhalers until pt becomes more alert and close to baseline.    HLD: Continue lipitor 80 mg.  Hypokalemia 3.4 today. Repleted.  AKI: Resolved   Disposition: Extensive conversation with daughter, Diannia Ruder, on the phone today discussing goals of care yesterday.  Daughter worried that she would not be able to take care of her if she were to come home given her current state. -Consult to palliative care for further help managing disposition, pending   Diet: HH VTE:  Warfarin Code: DNR Limited PT:  CIR vs SNF  Carmina Miller, DO 08/06/2023, 1:49 PM  Pager: [phone] After 5pm or weekend: [phone]

## 2023-08-06 NOTE — Progress Notes (Signed)
   08/06/23 1300  Spiritual Encounters  Type of Visit Follow up  Care provided to: Patient;Family  Reason for visit Urgent spiritual support  OnCall Visit No    Priests on call not available. Visited pt to ask what parish she attends. She informed me [employer] in [place]. Called Fr. Gwenevere Ghazi from that parish and left him a voicemail requesting visit as soon as possible.

## 2023-08-06 NOTE — Plan of Care (Signed)
  Problem: Activity: Goal: Risk for activity intolerance will decrease Outcome: Progressing   Problem: Coping: Goal: Level of anxiety will decrease Outcome: Progressing   Problem: Elimination: Goal: Will not experience complications related to urinary retention Outcome: Progressing   

## 2023-08-07 DIAGNOSIS — Z952 Presence of prosthetic heart valve: Secondary | ICD-10-CM | POA: Diagnosis not present

## 2023-08-07 DIAGNOSIS — J9601 Acute respiratory failure with hypoxia: Secondary | ICD-10-CM | POA: Diagnosis not present

## 2023-08-07 DIAGNOSIS — D539 Nutritional anemia, unspecified: Secondary | ICD-10-CM | POA: Diagnosis not present

## 2023-08-07 DIAGNOSIS — R410 Disorientation, unspecified: Secondary | ICD-10-CM

## 2023-08-07 DIAGNOSIS — Z515 Encounter for palliative care: Secondary | ICD-10-CM | POA: Diagnosis not present

## 2023-08-07 DIAGNOSIS — Z7189 Other specified counseling: Secondary | ICD-10-CM | POA: Diagnosis not present

## 2023-08-07 DIAGNOSIS — I509 Heart failure, unspecified: Secondary | ICD-10-CM | POA: Diagnosis not present

## 2023-08-07 LAB — BASIC METABOLIC PANEL
Anion gap: 10 (ref 5–15)
BUN: 41 mg/dL — ABNORMAL HIGH (ref 8–23)
CO2: 27 mmol/L (ref 22–32)
Calcium: 8.1 mg/dL — ABNORMAL LOW (ref 8.9–10.3)
Chloride: 94 mmol/L — ABNORMAL LOW (ref 98–111)
Creatinine, Ser: 1.34 mg/dL — ABNORMAL HIGH (ref 0.44–1.00)
GFR, Estimated: 37 mL/min — ABNORMAL LOW (ref >60)
Glucose, Bld: 95 mg/dL (ref 70–99)
Potassium: 3.7 mmol/L (ref 3.5–5.1)
Sodium: 131 mmol/L — ABNORMAL LOW (ref 135–145)

## 2023-08-07 LAB — PROTIME-INR
INR: 2.3 — ABNORMAL HIGH (ref 0.8–1.2)
Prothrombin Time: 25.3 s — ABNORMAL HIGH (ref 11.4–15.2)

## 2023-08-07 LAB — CBC
HCT: 23.9 % — ABNORMAL LOW (ref 36.0–46.0)
Hemoglobin: 7.7 g/dL — ABNORMAL LOW (ref 12.0–15.0)
MCH: 33 pg (ref 26.0–34.0)
MCHC: 32.2 g/dL (ref 30.0–36.0)
MCV: 102.6 fL — ABNORMAL HIGH (ref 80.0–100.0)
Platelets: 250 10*3/uL (ref 150–400)
RBC: 2.33 MIL/uL — ABNORMAL LOW (ref 3.87–5.11)
RDW: 19.2 % — ABNORMAL HIGH (ref 11.5–15.5)
WBC: 12.9 10*3/uL — ABNORMAL HIGH (ref 4.0–10.5)
nRBC: 0.6 % — ABNORMAL HIGH (ref 0.0–0.2)

## 2023-08-07 MED ORDER — WARFARIN SODIUM 2 MG PO TABS
4.0000 mg | ORAL_TABLET | Freq: Once | ORAL | Status: AC
  Administered 2023-08-07: 4 mg via ORAL
  Filled 2023-08-07: qty 2

## 2023-08-07 NOTE — Progress Notes (Addendum)
PHARMACY - ANTICOAGULATION CONSULT NOTE  Pharmacy Consult for warfarin dosing. Indication: atrial fibrillation and history of mitral valve mechanical prosthetic valve replacement requiring INR target range 2.5 - 3.5.   Allergies  Allergen Reactions   Neurontin [Gabapentin] Other (See Comments)    Weakness    Percocet [Oxycodone-Acetaminophen] Other (See Comments)    Percocet 5-325mg  dose too strong, OK to take Norco 5-325mg .    Patient Measurements: Height: 5\' 6"  (167.6 cm) Weight: 83 kg (182 lb 15.7 oz) IBW/kg (Calculated) : 59.3     Vital Signs: Temp: 98.2 F (36.8 C) (01/21 0713) Temp Source: Oral (01/21 0713) BP: 118/63 (01/21 0713) Pulse Rate: 71 (01/21 0713)  Labs: Recent Labs    08/05/23 0238 08/06/23 0301 08/07/23 0239  HGB 7.6* 7.4* 7.7*  HCT 23.2* 23.5* 23.9*  PLT 221 226 250  LABPROT 29.9* 26.2* 25.3*  INR 2.8* 2.4* 2.3*  CREATININE 1.50* 1.34* 1.34*    Estimated Creatinine Clearance: 29.1 mL/min (A) (by C-G formula based on SCr of 1.34 mg/dL (H)).   Medical History: Past Medical History:  Diagnosis Date   Arrhythmia    chronic atrial fib. with controlled rate ,on warfarin therapy   High cholesterol    Hypertension    Lower extremity edema    mild -stable   Mitral valve stenosis 10/1994   s/p St. Jude mechanical valve placement -61M-101 MODEL   Stroke (HCC) 08/2010   from office visit 2013- the setting of normal INR of 2.8 ;therefore increased goal to 3.5   Thyroid disease    hypothyroidism on synthroid    Medications:  Scheduled:   atorvastatin  80 mg Oral Daily   diclofenac Sodium  2 g Topical BID   furosemide  40 mg Oral BID   Gerhardt's butt cream   Topical Daily   ipratropium-albuterol  3 mL Nebulization BID   levothyroxine  100 mcg Oral QAC breakfast   senna-docusate  1 tablet Oral BID   sodium chloride HYPERTONIC  4 mL Nebulization BID   Warfarin - Pharmacist Dosing Inpatient   Does not apply q1600    Assessment: 89YOF with h/o  atrial fibrillation and mechanical mitral valve replacement on warfarin who presented with SOB. PTA warfarin dosing: 2 mg TThSaSun and 4 mg on MWF. Pharmacy has been consulted to dose warfarin. INR today is 2.3 at 0239h. Hemoglobin up-trending past 24 hours, 7.7 gm/dL today. PTLC stable.  Goal of Therapy:  INR target range 2.5 - 3.5. Monitor platelets by anticoagulation protocol: Yes   Plan:  Continued down-drift of INR. Will repeat the 4mg  strength warfarin dose today at 1600h. Daily INRs, follow PLCT, Hbg and any signs or symptoms of blood loss or embolic disease.  Elicia Lamp, PharmD, CPP 08/07/2023,7:53 AM

## 2023-08-07 NOTE — Progress Notes (Signed)
Summary: Tina Wade is a 89 y.o. female with a 71-pack year history and past medical history significant for mitral valve replacement and prior L hip surgery presenting with difficulty getting up from her chair today and SOB and admitted for acute hypoxic respiratory failure.    Hospital Day: 6  Subjective:  Patient seen and evaluated while resting in bed. Patient reports she is better. Denies SOB or hematuria. Reports she is eating and drinking ok. No complaints.   Objective:  Vital signs in last 24 hours: Vitals:   08/07/23 1113 08/07/23 1115 08/07/23 1116 08/07/23 1118  BP:      Pulse:    70  Resp:      Temp:      TempSrc:      SpO2: (!) 76% (!) 78% 90% 95%  Weight:      Height:       Physical Exam Constitutional:      General: She is not in acute distress. Cardiovascular:     Rate and Rhythm: Normal rate.  Pulmonary:     Effort: Pulmonary effort is normal.     Breath sounds: Rales present.  Abdominal:     General: Bowel sounds are normal.     Palpations: Abdomen is soft.  Musculoskeletal:     Right lower leg: No edema.     Left lower leg: No edema.  Skin:    General: Skin is warm and dry.     Comments: Ecchymosis present on bilateral arms  Neurological:     Mental Status: She is alert and oriented to person, place, and time.   Weight change: -1.7 kg  Intake/Output Summary (Last 24 hours) at 08/07/2023 1145 Last data filed at 08/07/2023 0347 Gross per 24 hour  Intake --  Output 650 ml  Net -650 ml      Latest Ref Rng & Units 08/07/2023    2:39 AM 08/06/2023    3:01 AM 08/05/2023    2:38 AM  CBC  WBC 4.0 - 10.5 K/uL 12.9  15.8  19.8   Hemoglobin 12.0 - 15.0 g/dL 7.7  7.4  7.6   Hematocrit 36.0 - 46.0 % 23.9  23.5  23.2   Platelets 150 - 400 K/uL 250  226  221       Latest Ref Rng & Units 08/07/2023    2:39 AM 08/06/2023    3:01 AM 08/05/2023    2:38 AM  CMP  Glucose 70 - 99 mg/dL 95  130  865   BUN 8 - 23 mg/dL 41  49  64   Creatinine 0.44 -  1.00 mg/dL 7.84  6.96  2.95   Sodium 135 - 145 mmol/L 131  131  129   Potassium 3.5 - 5.1 mmol/L 3.7  3.4  4.1   Chloride 98 - 111 mmol/L 94  95  94   CO2 22 - 32 mmol/L 27  26  26    Calcium 8.9 - 10.3 mg/dL 8.1  8.1  7.9    PT: 28.4 INR: 2.3  Assessment/Plan:  Principal Problem:   Acute hypoxic respiratory failure (HCC) Active Problems:   S/P MVR (mitral valve replacement)   HLD (hyperlipidemia)   Hypothyroidism   Moderate protein-calorie malnutrition (HCC)   Congestive heart failure (HCC)   Chronic kidney disease   Normocytic anemia   Supratherapeutic INR   Urinary tract infection   Delirium  Tina Wade is a 89 y.o.female with a 71-pack year history and past  medical history significant for mitral valve replacement and prior L hip surgery presenting with difficulty getting up from her chair today and SOB and admitted for acute hypoxic respiratory failure on HD 6.  AHRF 2/2 CHF exacerbation Improved. Patient saturations mid-90's on 1 L [place]. Patient able to be weaned to RA with saturations ~91-92% during exam. Patient transitioned to home PO lasix yesterday. Peripheral edema improved since admission. Patient advised if she feels short of breath to let her nurse know  - Continue Lasix 40 mg BID - Continue trending O2 requirement  - Continue assessing volume status - Continue strict I/O   UTI/Leukocytosis Improved. WBC 12.9 today from 15.8 yesterday. Last dose of Rocephin yesterday. Patient remains afebrile.  - CBC tomorrow AM  Hx of Mechanical Mitral Valve Replacement/Valvular A-fib On Warfarin with pharmacy managing, appreciate assistance. INR now 2.3 from 2.4 yesterday with goal range of 2.5-3.5. - Continue warfarin dosing per pharmacy - Continue trending PT and INR  Macrocytic Anemia Improved with Hgb of 7.7 today increasing from 7.4 yesterday. MCV increased to 102.6 today from 101.3 yesterday. Patient has iron deficiency anemia as TSAT ratio elevated on 1/17. RPI  was 1.2 on 1/17 and IV iron was given on 1/18. Anemia originally normocytic and now macrocytic. Vitamin B12 was WNL and folate was WNL yesterday.  - Continue to monitor - CBC tomorrow AM  Hospital Delirium Patient oriented to person, place and time today. She asked if Presidential Inauguration occurred yet. Hx of waxing and waning delirium during hospitalization.  - Delirium precautions   Hypothyroidism  - Continue home levothyroxine   Hip Osteoarthritis  Hip x-ray revealed worsening OA. Pt on Norco 5-325 mg PRN. Will continue to treat as pain can lead to delirium as well. - Continue Norco 5-325 mg PRN - Continue Voltaren gel  Hx of COPD - Continue duonebs  HLD - Continue lipitor 80 mg   Hypokalemia  Resolved. 3.7 today from 3.4 after K repletion yesterday.   AKI Resolved.  Disposition Palliative consult ordered yesterday. Family plans to discuss disposition after palliative care team meets with patient. Team called daughter, Diannia Ruder today to update her on the plan.   Diet: HH VTE:  Warfarin Code: DNR Limited PT: CIR vs SNF   LOS: 6 days   Bubba Hales, Medical Student 08/07/2023, 11:45 AM

## 2023-08-07 NOTE — Consult Note (Signed)
Palliative Care Consult Note                                  Date: 08/07/2023   Patient Name: Tina Wade  DOB: 01-08-1929  MRN: [id number]  Age / Sex: 89 y.o., female  PCP: Tracey Harries, MD Referring Physician: Ginnie Smart, MD  Reason for Consultation: {Reason for Consult:23484}  HPI/Patient Profile: 89 y.o. female  with past medical history of *** admitted on 08/01/2023 with ***.   Past Medical History:  Diagnosis Date   Arrhythmia    chronic atrial fib. with controlled rate ,on warfarin therapy   High cholesterol    Hypertension    Lower extremity edema    mild -stable   Mitral valve stenosis 10/1994   s/p St. Jude mechanical valve placement -59M-101 MODEL   Stroke (HCC) 08/2010   from office visit 2013- the setting of normal INR of 2.8 ;therefore increased goal to 3.5   Thyroid disease    hypothyroidism on synthroid    Subjective:   This NP Wynne Dust reviewed medical records, received report from team, assessed the patient and then meet at the patient's bedside to discuss diagnosis, prognosis, GOC, EOL wishes disposition and options.  I met with ***.   We meet to discuss diagnosis prognosis, GOC, EOL wishes, disposition and options. Concept of Palliative Care was introduced as specialized medical care for people and their families living with serious illness.  If focuses on providing relief from the symptoms and stress of a serious illness.  The goal is to improve quality of life for both the patient and the family. Values and goals of care important to patient and family were attempted to be elicited.  ***  Created space and opportunity for patient  and family to explore thoughts and feelings regarding current medical situation   Natural trajectory and current clinical status were discussed. Questions and concerns addressed. Patient  encouraged to call with questions or concerns.    Patient/Family  Understanding of Illness: ***  Life Review: ***  Patient Values: ***  Goals: ***  Today's Discussion: ***  Review of Systems  Objective:   Primary Diagnoses: Present on Admission:  Acute hypoxic respiratory failure (HCC)  HLD (hyperlipidemia)  Hypothyroidism   Physical Exam  Vital Signs:  BP 118/63 (BP Location: Right Arm)   Pulse 71   Temp 98.2 F (36.8 C) (Oral)   Resp 16   Ht 5\' 6"  (1.676 m)   Wt 83 kg   SpO2 93%   BMI 29.53 kg/m   Palliative Assessment/Data: ***    Advanced Care Planning:   Existing Vynca/ACP Documentation: ***  Primary Decision Maker: {Primary Decision UYQIH:47425}  Code Status/Advance Care Planning: {Palliative Code status:23503}  A discussion was had today regarding advanced directives. Concepts specific to code status, artifical feeding and hydration, continued IV antibiotics and rehospitalization was had.  The difference between a aggressive medical intervention path and a palliative comfort care path for this patient at this time was had. ***The MOST form was introduced and discussed.***  Decisions/Changes to ACP: ***  Assessment & Plan:   Impression: ***  SUMMARY OF RECOMMENDATIONS   ***  Symptom Management:  ***  Prognosis:  {Palliative Care Prognosis:23504}  Discharge Planning:  {Palliative dispostion:23505}   Discussed with: ***    Thank you for allowing [place] to participate in the care of Tina Wade PMT will continue  to support holistically.  Time Total: ***  Detailed review of medical records (labs, imaging, vital signs), medically appropriate exam, discussed with treatment team, counseling and education to patient, family, & staff, documenting clinical information, medication management, coordination of care  Signed by: Wynne Dust, NP Palliative Medicine Team  Team Phone # [phone] (Nights/Weekends)  08/07/2023, 10:11 AM

## 2023-08-07 NOTE — Progress Notes (Signed)
Physical Therapy Treatment Patient Details Name: Tina Wade MRN: [id number] DOB: Aug 03, 1928 Today's Date: 08/07/2023   History of Present Illness 89 y.o. female who presented on 08/01/23 with  SOB, acute hypoxic respiratory failure, and CHF. Imaging: Progressive and advanced osteoarthritis of the left hip, osteopenia/osteoporosis, and   aortic atherosclerosis. Of note, pt had PNA 2-3 weeks ago, chronic L hip pain secondary to proximal femur fx in 2020, and recent UTI. PMHx: arrhythmia, high cholesterol, HTN, lower extremity edema, mitral valve stenosis, CVA, hypothyroid diease.    PT Comments  Pt asleep in bed at start of session, willing to work with PT. Pt A,O to self and place only. Pt required maxA x2 for supine<>sit and CGA to scoot forward, backward, sideways. More physical assistance was required as pt fatigue. Increased time to follow commands and complete tasks was allotted. Pt demonstrated improved seated balance EOB compared to last session maintaining upright posture with CGA-Sup and BUE support. Attempted to stand twice without success. Pt's primary limiting factory is lethargy. Unable to progress transfers or ambulation. Patient will benefit from continued inpatient follow up therapy, <3 hours/day. Will continue to follow acutely and advance appropriately.    If plan is discharge home, recommend the following: Assistance with cooking/housework;Assist for transportation;Two people to help with walking and/or transfers;Two people to help with bathing/dressing/bathroom;Direct supervision/assist for medications management;Direct supervision/assist for financial management;Help with stairs or ramp for entrance   Can travel by private vehicle     No  Equipment Recommendations  Hospital bed;Hoyer lift;Wheelchair (measurements PT);Wheelchair cushion (measurements PT) (If pt were to go Home)    Recommendations for Other Services       Precautions / Restrictions  Precautions Precautions: Fall Precaution Comments: SpO2 >94% Restrictions Weight Bearing Restrictions Per Provider Order: No     Mobility  Bed Mobility Overal bed mobility: Needs Assistance Bed Mobility: Supine to Sit, Sit to Supine     Supine to sit: Max assist, +2 for physical assistance, HOB elevated Sit to supine: Max assist, +2 for physical assistance   General bed mobility comments: Cued pt on hand placement and sequencing to get in/out of bed. Pt required modA to bring BLE off EOB and maxA x2 to manage trunk/pivot hips to sit EOB. Pt assisted with scooting fwd/bkwd/laterally demonstrating good clearence off bed and fwd lean to shift. Repositioned pt in bed using bed features.    Transfers Overall transfer level: Needs assistance Equipment used: Rolling walker (2 wheels), None Transfers: Sit to/from Stand Sit to Stand: Max assist, +2 physical assistance, +2 safety/equipment           General transfer comment: Attempted to stand two times, once using RW and then without an AD. Pt with limited WBing on BLE and lack of clearence from bed. Pt refused further mobility requesting to try again later and let her sleep.    Ambulation/Gait                   Stairs             Wheelchair Mobility     Tilt Bed    Modified Rankin (Stroke Patients Only)       Balance Overall balance assessment: Needs assistance Sitting-balance support: Feet supported, Bilateral upper extremity supported Sitting balance-Leahy Scale: Fair Sitting balance - Comments: Pt maintained seated balance EOB with supervision and no lean. Engaged in BLE activities.  Cognition Arousal: Lethargic Behavior During Therapy: Flat affect Overall Cognitive Status: Difficult to assess                                 General Comments: Pt is confused reporting her mother is taking care of her and that the month was November. Pt  is oriented to self and place, but not situation or time.  Pt frequently with her eyes closed attempting to return to sleep during session.        Exercises General Exercises - Lower Extremity Ankle Circles/Pumps: Seated, Both, 10 reps Short Arc Quad: Seated, Both, 10 reps Toe Raises: Seated, Both, 15 reps Heel Raises: Seated, Both, 15 reps    General Comments General comments (skin integrity, edema, etc.): VSS: supine BP 188/63 (80), HR 84, SpO2 98% on 2L via [place]; sitting BP 122/53 (74), HR 91, SpO2 93% on RA; supine HOB flat SpO2 89% redonned 2LO2.      Pertinent Vitals/Pain Pain Assessment Pain Assessment: Faces Faces Pain Scale: Hurts little more Pain Location: R hip Pain Descriptors / Indicators: Sore, Grimacing, Guarding Pain Intervention(s): Monitored during session, Repositioned    Home Living                          Prior Function            PT Goals (current goals can now be found in the care plan section) Acute Rehab PT Goals Patient Stated Goal: Go Home PT Goal Formulation: With patient/family Time For Goal Achievement: 08/18/23 Potential to Achieve Goals: Fair Progress towards PT goals: Progressing toward goals    Frequency    Min 1X/week      PT Plan      Co-evaluation              AM-PAC PT "6 Clicks" Mobility   Outcome Measure  Help needed turning from your back to your side while in a flat bed without using bedrails?: Total Help needed moving from lying on your back to sitting on the side of a flat bed without using bedrails?: Total Help needed moving to and from a bed to a chair (including a wheelchair)?: Total Help needed standing up from a chair using your arms (e.g., wheelchair or bedside chair)?: Total Help needed to walk in hospital room?: Total Help needed climbing 3-5 steps with a railing? : Total 6 Click Score: 6    End of Session Equipment Utilized During Treatment: Gait belt;Oxygen Activity Tolerance: Patient  limited by lethargy Patient left: in bed;with bed alarm set;with call bell/phone within reach Nurse Communication: Mobility status PT Visit Diagnosis: Unsteadiness on feet (R26.81);Other abnormalities of gait and mobility (R26.89);Muscle weakness (generalized) (M62.81);Difficulty in walking, not elsewhere classified (R26.2);Pain     Time: 3086-5784 PT Time Calculation (min) (ACUTE ONLY): 28 min  Charges:    $Therapeutic Activity: 23-37 mins                       Cheri Guppy, PT, DPT Acute Rehabilitation Services Office: [phone] Secure Chat Preferred    Richardson Chiquito 08/07/2023, 12:44 PM

## 2023-08-07 NOTE — Plan of Care (Signed)
  Problem: Activity: Goal: Risk for activity intolerance will decrease Outcome: Progressing   Problem: Coping: Goal: Level of anxiety will decrease Outcome: Progressing   Problem: Pain Managment: Goal: General experience of comfort will improve and/or be controlled Outcome: Progressing   Problem: Skin Integrity: Goal: Risk for impaired skin integrity will decrease Outcome: Progressing

## 2023-08-07 NOTE — NC FL2 (Signed)
[place] MEDICAID FL2 LEVEL OF CARE FORM     IDENTIFICATION  Patient Name: Tina Wade Birthdate: [birth year] Sex: female Admission Date (Current Location): 08/01/2023  [facility] [place] Number:  Producer, television/film/video and Address:  [facility], [address], [place], [place] [address]      Provider Number: [id number]  Attending Physician Name and Address:  Ginnie Smart, MD  Relative Name and Phone Number:       Current Level of Care: Hospital Recommended Level of Care: Skilled Nursing Facility Prior Approval Number:    Date Approved/Denied:   PASRR Number: [id number]  Discharge Plan: SNF    Current Diagnoses: Patient Active Problem List   Diagnosis Date Noted   Normocytic anemia 08/04/2023   Supratherapeutic INR 08/04/2023   Urinary tract infection 08/04/2023   Delirium 08/04/2023   Chronic kidney disease 08/02/2023   Acute hypoxic respiratory failure (HCC) 08/01/2023   Moderate protein-calorie malnutrition (HCC) 08/01/2023   Congestive heart failure (HCC) 08/01/2023   Left displaced femoral neck fracture (HCC) 02/22/2019   Closed left hip fracture, initial encounter (HCC) 02/22/2019   Anticoagulated on warfarin    Nonrheumatic aortic valve stenosis 11/20/2018   Dyspnea on minimal exertion 11/07/2018   TIA (transient ischemic attack) 05/24/2018   HLD (hyperlipidemia) 05/24/2018   Reactive airway disease 05/24/2018   Chronic combined systolic and diastolic CHF, NYHA class 2 (HCC) 11/14/2017   COPD (chronic obstructive pulmonary disease) (HCC) 11/07/2016   Dysphagia 09/20/2016   SAH (subarachnoid hemorrhage) (HCC) 09/16/2015   Hypokalemia 09/12/2015   History of TIA (transient ischemic attack) 03/26/2014   Bilateral lower extremity edema - mild 05/02/2013   Cigarette smoker one half pack a day or less unmotivated he quit -  05/02/2013   Chronic atrial fibrillation (HCC) 04/30/2013   Essential hypertension 04/30/2013    Iron deficiency anemia due to chronic blood loss 01/03/2013   Adenoma of left adrenal gland 12/22/2012   Long term (current) use of anticoagulants 10/03/2012   CVA (cerebral vascular accident) (HCC) 10/03/2012   S/P MVR (mitral valve replacement) 10/03/2012   Gastrointestinal hemorrhage with melena 06/18/2012   Age-related osteoporosis without current pathological fracture 02/13/2012   Cardiomegaly 01/11/2012   Former smoker 04/04/2011   Old embolic stroke without late effect 10/18/2010   Hypothyroidism 08/04/2010    Orientation RESPIRATION BLADDER Height & Weight     Self, Time, Place  O2 (2L [place]) External catheter, Incontinent Weight: 182 lb 15.7 oz (83 kg) Height:  5\' 6"  (167.6 cm)  BEHAVIORAL SYMPTOMS/MOOD NEUROLOGICAL BOWEL NUTRITION STATUS      Continent Diet (see dc summary)  AMBULATORY STATUS COMMUNICATION OF NEEDS Skin   Extensive Assist Verbally Other (Comment) (Wound / Incision: Irritant Dermatitis (Moisture Associated Skin Damage) Other (Comment) Anterior;Right red, broken skin)                       Personal Care Assistance Level of Assistance  Bathing, Feeding, Dressing Bathing Assistance: Maximum assistance Feeding assistance: Limited assistance Dressing Assistance: Maximum assistance     Functional Limitations Info  Sight, Hearing, Speech Sight Info: Adequate Hearing Info: Adequate Speech Info: Adequate    SPECIAL CARE FACTORS FREQUENCY  PT (By licensed PT), OT (By licensed OT)     PT Frequency: 5x week OT Frequency: 5x week            Contractures Contractures Info: Not present    Additional Factors Info  Code Status, Allergies  Code Status Info: DNR Limited Allergies Info: Neurontin (gabapentin), Percocet (oxycodone-acetaminophen)           Current Medications (08/07/2023):  This is the current hospital active medication list Current Facility-Administered Medications  Medication Dose Route Frequency Provider Last Rate Last Admin    acetaminophen (TYLENOL) tablet 1,000 mg  1,000 mg Oral Q12H PRN Gwenevere Abbot, MD   1,000 mg at 08/05/23 1246   atorvastatin (LIPITOR) tablet 80 mg  80 mg Oral Daily Carmina Miller, DO   80 mg at 08/07/23 2725   diclofenac Sodium (VOLTAREN) 1 % topical gel 2 g  2 g Topical BID Carmina Miller, DO   2 g at 08/07/23 0805   furosemide (LASIX) tablet 40 mg  40 mg Oral BID Carmina Miller, DO   40 mg at 08/07/23 3664   Gerhardt's butt cream   Topical Daily Carmina Miller, DO   Given at 08/07/23 0805   HYDROcodone-acetaminophen (NORCO/VICODIN) 5-325 MG per tablet 1 tablet  1 tablet Oral Q6H PRN Carmina Miller, DO   1 tablet at 08/06/23 1120   ipratropium-albuterol (DUONEB) 0.5-2.5 (3) MG/3ML nebulizer solution 3 mL  3 mL Nebulization BID Carlynn Purl C, DO   3 mL at 08/07/23 4034   levothyroxine (SYNTHROID) tablet 100 mcg  100 mcg Oral QAC breakfast Gwenevere Abbot, MD   100 mcg at 08/07/23 0532   Oral care mouth rinse  15 mL Mouth Rinse PRN Ginnie Smart, MD       senna-docusate (Senokot-S) tablet 1 tablet  1 tablet Oral BID Gwenevere Abbot, MD   1 tablet at 08/07/23 0805   sodium chloride HYPERTONIC 3 % nebulizer solution 4 mL  4 mL Nebulization BID Carmina Miller, DO   4 mL at 08/07/23 7425   warfarin (COUMADIN) tablet 4 mg  4 mg Oral ONCE-1600 Ginnie Smart, MD       Warfarin - Pharmacist Dosing Inpatient   Does not apply q1600 Jenita Seashore [facility]   Given at 08/06/23 1642     Discharge Medications: Please see discharge summary for a list of discharge medications.  Relevant Imaging Results:  Relevant Lab Results:   Additional Information SSN [id number] [place], [place]

## 2023-08-08 DIAGNOSIS — Z7189 Other specified counseling: Secondary | ICD-10-CM | POA: Diagnosis not present

## 2023-08-08 DIAGNOSIS — F05 Delirium due to known physiological condition: Secondary | ICD-10-CM

## 2023-08-08 DIAGNOSIS — Z952 Presence of prosthetic heart valve: Secondary | ICD-10-CM | POA: Diagnosis not present

## 2023-08-08 DIAGNOSIS — R339 Retention of urine, unspecified: Secondary | ICD-10-CM

## 2023-08-08 DIAGNOSIS — D539 Nutritional anemia, unspecified: Secondary | ICD-10-CM | POA: Diagnosis not present

## 2023-08-08 DIAGNOSIS — J9601 Acute respiratory failure with hypoxia: Secondary | ICD-10-CM | POA: Diagnosis not present

## 2023-08-08 DIAGNOSIS — M25559 Pain in unspecified hip: Secondary | ICD-10-CM

## 2023-08-08 DIAGNOSIS — Z515 Encounter for palliative care: Secondary | ICD-10-CM

## 2023-08-08 DIAGNOSIS — I509 Heart failure, unspecified: Secondary | ICD-10-CM | POA: Diagnosis not present

## 2023-08-08 DIAGNOSIS — L539 Erythematous condition, unspecified: Secondary | ICD-10-CM

## 2023-08-08 DIAGNOSIS — K5900 Constipation, unspecified: Secondary | ICD-10-CM

## 2023-08-08 DIAGNOSIS — E876 Hypokalemia: Secondary | ICD-10-CM | POA: Diagnosis not present

## 2023-08-08 DIAGNOSIS — Z66 Do not resuscitate: Secondary | ICD-10-CM

## 2023-08-08 LAB — PROTIME-INR
INR: 2.3 — ABNORMAL HIGH (ref 0.8–1.2)
Prothrombin Time: 25.9 s — ABNORMAL HIGH (ref 11.4–15.2)

## 2023-08-08 LAB — CBC
HCT: 25.4 % — ABNORMAL LOW (ref 36.0–46.0)
Hemoglobin: 7.7 g/dL — ABNORMAL LOW (ref 12.0–15.0)
MCH: 31.7 pg (ref 26.0–34.0)
MCHC: 30.3 g/dL (ref 30.0–36.0)
MCV: 104.5 fL — ABNORMAL HIGH (ref 80.0–100.0)
Platelets: 261 10*3/uL (ref 150–400)
RBC: 2.43 MIL/uL — ABNORMAL LOW (ref 3.87–5.11)
RDW: 19.7 % — ABNORMAL HIGH (ref 11.5–15.5)
WBC: 11.4 10*3/uL — ABNORMAL HIGH (ref 4.0–10.5)
nRBC: 0.4 % — ABNORMAL HIGH (ref 0.0–0.2)

## 2023-08-08 LAB — BASIC METABOLIC PANEL
Anion gap: 9 (ref 5–15)
BUN: 36 mg/dL — ABNORMAL HIGH (ref 8–23)
CO2: 30 mmol/L (ref 22–32)
Calcium: 8.1 mg/dL — ABNORMAL LOW (ref 8.9–10.3)
Chloride: 94 mmol/L — ABNORMAL LOW (ref 98–111)
Creatinine, Ser: 1.21 mg/dL — ABNORMAL HIGH (ref 0.44–1.00)
GFR, Estimated: 42 mL/min — ABNORMAL LOW (ref >60)
Glucose, Bld: 107 mg/dL — ABNORMAL HIGH (ref 70–99)
Potassium: 3.4 mmol/L — ABNORMAL LOW (ref 3.5–5.1)
Sodium: 133 mmol/L — ABNORMAL LOW (ref 135–145)

## 2023-08-08 MED ORDER — HYDROCODONE-ACETAMINOPHEN 5-325 MG PO TABS
1.0000 | ORAL_TABLET | ORAL | Status: DC | PRN
  Administered 2023-08-08 – 2023-08-09 (×4): 1 via ORAL
  Filled 2023-08-08 (×4): qty 1

## 2023-08-08 MED ORDER — WARFARIN SODIUM 2 MG PO TABS
4.0000 mg | ORAL_TABLET | Freq: Once | ORAL | Status: AC
Start: 2023-08-08 — End: 2023-08-08
  Administered 2023-08-08: 4 mg via ORAL
  Filled 2023-08-08: qty 2

## 2023-08-08 MED ORDER — POTASSIUM CHLORIDE 20 MEQ PO PACK
20.0000 meq | PACK | Freq: Two times a day (BID) | ORAL | Status: DC
  Administered 2023-08-08 (×2): 20 meq via ORAL
  Filled 2023-08-08 (×2): qty 1

## 2023-08-08 MED ORDER — SENNOSIDES-DOCUSATE SODIUM 8.6-50 MG PO TABS
2.0000 | ORAL_TABLET | Freq: Two times a day (BID) | ORAL | Status: DC
  Administered 2023-08-08 – 2023-08-09 (×2): 2 via ORAL
  Filled 2023-08-08 (×2): qty 2

## 2023-08-08 MED ORDER — ENSURE ENLIVE PO LIQD
237.0000 mL | Freq: Two times a day (BID) | ORAL | Status: DC
Start: 2023-08-08 — End: 2023-08-09
  Administered 2023-08-08 – 2023-08-09 (×2): 237 mL via ORAL

## 2023-08-08 NOTE — Progress Notes (Signed)
This chaplain responded to PMT NP-Eric consult for creating the Pt. Advance Directive:  HCPOA. The Pt. is not completing a Living Will.  The Pt. answered the chaplain's clarifying questions on the selection of Chloe Adgate as her healthcare agent and the purpose of an AD. The chaplain understands Avanelle Ritch is the Pt. next choice for healthcare agent.  The chaplain is present with the Pt., notary and witnesses for the notarizing of the Pt. Advance Directive.  The chaplain gave the Pt. the original AD along with two copies.  The chaplain placed the completed AD documents in the Pt. Patient Belongings Bag. The chaplain scanned the Pt. AD into the Pt. EMR.  This chaplain is available for F/U spiritual care as needed.  Chaplain Stephanie Acre [phone]

## 2023-08-08 NOTE — Plan of Care (Signed)

## 2023-08-08 NOTE — TOC Progression Note (Signed)
Transition of Care [facility]) - Progression Note    Patient Details  Name: Tina Wade MRN: [id number] Date of Birth: 05/05/1929  Transition of Care [facility]) CM/SW Contact  Michaela Corner, [place] Phone Number: 08/08/2023, 10:30 AM  Clinical Narrative:   CSW spoke with pts dtr, Diannia Ruder, regarding bed offers for SNF. Diannia Ruder will review medicare.gov rated bed offers and follow up with CSW for choice.            Skilled Nursing Rehab Facilities-   [email or web address]     Ratings out of 5 stars (5 the highest)    Name Address  Phone # Quality Care Staffing Health Inspection Overall  [facility] [address], [place] [phone] 2 2 5 5   [facility] [address] [place] [phone] 4 2 4 4   [facility] [address], [facility] [phone] 3 2 2 2   [facility] [address], [place] [phone] 5 1 2 2   [address] ([place]) [address], [place] [phone] 2 2 2 2   [facility] [address] [place], [place] [phone] 2 2 1 1   [facility] ([place]) [address] [phone] 3 1 1 1   [facility] [address] [place] [phone] 2 1 2 1   [facility] [address] [place] [phone] 2 1 1 1   [facility] [address] Liliane Shi [phone] 4 2 5 5   [facility]* (will make offer if bed available)  [address], [place] [phone] 2 1 1 1     Expected Discharge Plan: Skilled Nursing Facility Barriers to Discharge: Continued Medical Work up, Other (must enter comment) (Waiting on family decision of SNF)  Expected Discharge Plan and Services In-house Referral: NA Discharge Planning Services: CM Consult Post Acute Care Choice: NA Living arrangements for the past 2 months: Single Family Home                 DME Arranged: N/A DME Agency: NA       HH Arranged: NA           Social Determinants of Health (SDOH)  Interventions SDOH Screenings   Food Insecurity: No Food Insecurity (08/02/2023)  Housing: Low Risk  (08/02/2023)  Transportation Needs: No Transportation Needs (08/02/2023)  Utilities: Not At Risk (08/02/2023)  Financial Resource Strain: Low Risk  (07/23/2023)   Received from [facility]  Physical Activity: Unknown (09/12/2022)   Received from [facility]  Social Connections: Moderately Isolated (08/02/2023)  Stress: No Stress Concern Present (05/06/2023)   [facility] from [facility]  Tobacco Use: High Risk (08/01/2023)    Readmission Risk Interventions     No data to display

## 2023-08-08 NOTE — Progress Notes (Signed)
Occupational Therapy Treatment Patient Details Name: Tina Wade MRN: [id number] DOB: 1928/08/27 Today's Date: 08/08/2023   History of present illness 89 y.o. female who presented on 08/01/23 with  SOB, acute hypoxic respiratory failure, and CHF. Imaging: Progressive and advanced osteoarthritis of the left hip, osteopenia/osteoporosis, and   aortic atherosclerosis. Of note, pt had PNA 2-3 weeks ago, chronic L hip pain secondary to proximal femur fx in 2020, and recent UTI. PMHx: arrhythmia, high cholesterol, HTN, lower extremity edema, mitral valve stenosis, CVA, hypothyroid diease.   OT comments  Pt in bed asleep upon OT arrival. Pt easy to awake but initially declining participation in skilled OT session due to to pain, fatigue, and lethargy. OT educated pt in role and importance of skilled OT services with pt verbalizing understanding and then agreeable to address bed mobility to improve comfort and self feeding with max encouragement. Pt demonstrates ability to roll L/R in bed with Max assist with use of rails and cues for hand placement and to use cup with straw with Set up/Supervision and increased time. Pt reported increased comfort and decreased pain with repositioning in bed but declined further participation in OT this session. Pt HR in the mid-60s to low 80s and O2 sat between 87% and 91% on RA. However, OT unable to get a good pleth. RN notified of this and of pt request for pain medication. Pt is making slow progress toward OT goals. Pt will benefit from continued acute skilled OT services to address deficits outlined below, decrease caregiver burden, and increase safety and independence with functional tasks. Post acute discharge, pt will benefit from intensive inpatient skilled rehab services < 3 hours per day to maximize rehab potential.       If plan is discharge home, recommend the following:  Two people to help with walking and/or transfers;Two people to help with  bathing/dressing/bathroom;Assistance with cooking/housework;Assistance with feeding;Assist for transportation;Help with stairs or ramp for entrance   Equipment Recommendations  Hospital bed;Hoyer lift;Wheelchair (measurements OT);Wheelchair cushion (measurements OT)    Recommendations for Other Services      Precautions / Restrictions Precautions Precautions: Fall Precaution Comments: SpO2 >94% Restrictions Weight Bearing Restrictions Per Provider Order: No       Mobility Bed Mobility Overal bed mobility: Needs Assistance Bed Mobility: Rolling Rolling: Max assist, Used rails         General bed mobility comments: cues for hand placement; pt declined further bed mobility due to fatigue, leathargy, and pain    Transfers                         Balance                                           ADL either performed or assessed with clinical judgement   ADL Overall ADL's : Needs assistance/impaired Eating/Feeding: Set up;Supervision/ safety;Bed level (with HOB elevated and increased time) Eating/Feeding Details (indicate cue type and reason): with cup with lid and straw                                   General ADL Comments: Pt with significantly decreased activity tolerance and requiring max encouragement for minimal participation. Pt declined further addressing ADLs this day due to fatigue, lethargy, and pain  Extremity/Trunk Assessment Upper Extremity Assessment Upper Extremity Assessment: Generalized weakness;Left hand dominant   Lower Extremity Assessment Lower Extremity Assessment: Defer to PT evaluation        Vision       Perception     Praxis      Cognition Arousal: Lethargic Behavior During Therapy: Flat affect Overall Cognitive Status: Difficult to assess                                          Exercises      Shoulder Instructions       General Comments Pt HR in the mid-60s  to low 80s and O2 sat between 87% and 91% on RA. However, could not get a good pleth. RN notified. Pt initially declining participation in OT session. OT edcuated pt in role and impoartance of skilled OT services with pt then agreeable to bed mobility to improve comfort/decrease pain and addressing self-feeding/use of cup as pt reporting she was thirst. Pt reporting improved comfort following rolling in the bed/repositioning but declined further participation this day even from bed level due to fatigue, lethargy, and pain.    Pertinent Vitals/ Pain       Pain Assessment Pain Assessment: Faces Pain Location: R hip Pain Descriptors / Indicators: Sore, Grimacing, Guarding Pain Intervention(s): Limited activity within patient's tolerance, Monitored during session, Repositioned, Patient requesting pain meds-RN notified  Home Living                                          Prior Functioning/Environment              Frequency  Min 1X/week        Progress Toward Goals  OT Goals(current goals can now be found in the care plan section)  Progress towards OT goals: Progressing toward goals (Pt making slow progress toward gaosl secondary to minimal participation in sessions due to apin, fatigue, and lethargy)  Acute Rehab OT Goals Patient Stated Goal: pt did not state this session  Plan      Co-evaluation                 AM-PAC OT "6 Clicks" Daily Activity     Outcome Measure   Help from another person eating meals?: A Little Help from another person taking care of personal grooming?: A Little Help from another person toileting, which includes using toliet, bedpan, or urinal?: Total Help from another person bathing (including washing, rinsing, drying)?: A Lot Help from another person to put on and taking off regular upper body clothing?: A Lot Help from another person to put on and taking off regular lower body clothing?: Total 6 Click Score: 12    End of  Session    OT Visit Diagnosis: Muscle weakness (generalized) (M62.81);Other (comment) (decreased activity tolerance)   Activity Tolerance Patient limited by fatigue;Patient limited by lethargy;Patient limited by pain   Patient Left in bed;with call bell/phone within reach;with bed alarm set   Nurse Communication Mobility status;Other (comment);Patient requests pain meds (Vital signs with OT unable to get a good pleth; pt with limited participation with max encouragement)        Time: 1141-1150 OT Time Calculation (min): 9 min  Charges: OT General Charges $OT Visit: 1 Visit OT Treatments $Therapeutic  Activity: 8-22 mins  Destin Vinsant "Orson Eva., OTR/L, [place] Acute Rehab [phone]   Lendon Colonel 08/08/2023, 1:39 PM

## 2023-08-08 NOTE — Progress Notes (Signed)
PHARMACY - ANTICOAGULATION CONSULT NOTE  Pharmacy Consult for warfarin dosing. Indication: atrial fibrillation and history of mitral valve mechanical prosthetic valve replacement requiring INR target range 2.5 - 3.5.   Allergies  Allergen Reactions   Neurontin [Gabapentin] Other (See Comments)    Weakness    Percocet [Oxycodone-Acetaminophen] Other (See Comments)    Percocet 5-325mg  dose too strong, OK to take Norco 5-325mg .    Patient Measurements: Height: 5\' 6"  (167.6 cm) Weight: 83.4 kg (183 lb 13.8 oz) IBW/kg (Calculated) : 59.3   Vital Signs: Temp: 97.8 F (36.6 C) (01/22 0528) Temp Source: Axillary (01/22 0528) BP: 126/51 (01/22 0528) Pulse Rate: 92 (01/22 0644)  Labs: Recent Labs    08/06/23 0301 08/07/23 0239 08/08/23 0237  HGB 7.4* 7.7* 7.7*  HCT 23.5* 23.9* 25.4*  PLT 226 250 261  LABPROT 26.2* 25.3* 25.9*  INR 2.4* 2.3* 2.3*  CREATININE 1.34* 1.34* 1.21*    Estimated Creatinine Clearance: 32.3 mL/min (A) (by C-G formula based on SCr of 1.21 mg/dL (H)).   Medical History: Past Medical History:  Diagnosis Date   Arrhythmia    chronic atrial fib. with controlled rate ,on warfarin therapy   High cholesterol    Hypertension    Lower extremity edema    mild -stable   Mitral valve stenosis 10/1994   s/p [employer] Jude mechanical valve placement -4M-101 MODEL   Stroke (HCC) 08/2010   from office visit 2013- the setting of normal INR of 2.8 ;therefore increased goal to 3.5   Thyroid disease    hypothyroidism on synthroid    Medications:  Scheduled:   atorvastatin  80 mg Oral Daily   diclofenac Sodium  2 g Topical BID   furosemide  40 mg Oral BID   Gerhardt's butt cream   Topical Daily   ipratropium-albuterol  3 mL Nebulization BID   levothyroxine  100 mcg Oral QAC breakfast   potassium chloride  20 mEq Oral BID   senna-docusate  1 tablet Oral BID   Warfarin - Pharmacist Dosing Inpatient   Does not apply q1600    Assessment: 89YOF with h/o atrial  fibrillation and mechanical mitral valve replacement on warfarin who presented with SOB. PTA warfarin dosing: 2 mg TThSaSun and 4 mg on MWF. Pharmacy has been consulted to dose warfarin. INR today is 2.3 at 0239h. Hemoglobin up-trending past 24 hours, 7.7 gm/dL again today. PTLC stable. Re-evaluated any drug-drug/drug-disease state interactions/impact upon her INR response. Typically, we see the hypoprothrombinemic effect of anticoagulants may be increased by levothryroxine/Synthroid. Her home dosing regimen had been used up until this past Friday, 08/03/23 when her warfarin was held for 48 hours, then reinitiated. INR today is again 2.3.  Goal of Therapy:  INR target range 2.5 - 3.5. Monitor platelets by anticoagulation protocol: Yes    Plan:  Will give 4 mg of warfarin today at 1600 hours. Continue daily INR determinations, dose warfarin accordingly. Monitor PLCT and signs/symptoms suggestive of bleeding.   Elicia Lamp, PharmD, CPP 08/08/2023,8:24 AM

## 2023-08-08 NOTE — Progress Notes (Signed)
Summary: Tina Wade is a 89 y.o. female with a 71-pack year history and past medical history significant for mitral valve replacement and prior L hip surgery presenting with difficulty getting up from her chair today and SOB and admitted for acute hypoxic respiratory failure.    Hospital Day: 7  Subjective:  Patient seen and evaluated while resting in bed. She was asleep when team entered the room. She reports she is doing ok. She endorses drinking ok but denies eating much. Reports she does not have much of an appetite. Patient denies SOB, CP or abdominal pain. She reports last BM was 2 weeks ago. No other complaints.   Objective:  Vital signs in last 24 hours: Vitals:   08/08/23 0031 08/08/23 0528 08/08/23 0643 08/08/23 0644  BP: (!) 128/48 (!) 126/51    Pulse: 69 69 63 92  Resp: 18 18    Temp: 97.8 F (36.6 C) 97.8 F (36.6 C)    TempSrc: Axillary Axillary    SpO2: 100% 100% (!) 87% 92%  Weight:  83.4 kg    Height:       Physical Exam Constitutional:      General: She is not in acute distress. Cardiovascular:     Rate and Rhythm: Normal rate.     Heart sounds: Murmur heard.  Pulmonary:     Effort: Pulmonary effort is normal.     Breath sounds: Rales present.  Musculoskeletal:     Right lower leg: No edema.     Left lower leg: No edema.  Skin:    Comments: Redness appreciated on bilateral heels under mepilex dressings  Neurological:     Mental Status: She is oriented to person, place, and time.   Weight change: 0.4 kg  Intake/Output Summary (Last 24 hours) at 08/08/2023 0725 Last data filed at 08/08/2023 0529 Gross per 24 hour  Intake 100 ml  Output 1400 ml  Net -1300 ml      Latest Ref Rng & Units 08/08/2023    2:37 AM 08/07/2023    2:39 AM 08/06/2023    3:01 AM  CBC  WBC 4.0 - 10.5 K/uL 11.4  12.9  15.8   Hemoglobin 12.0 - 15.0 g/dL 7.7  7.7  7.4   Hematocrit 36.0 - 46.0 % 25.4  23.9  23.5   Platelets 150 - 400 K/uL 261  250  226       Latest Ref  Rng & Units 08/08/2023    2:37 AM 08/07/2023    2:39 AM 08/06/2023    3:01 AM  BMP  Glucose 70 - 99 mg/dL 811  95  914   BUN 8 - 23 mg/dL 36  41  49   Creatinine 0.44 - 1.00 mg/dL 7.82  9.56  2.13   Sodium 135 - 145 mmol/L 133  131  131   Potassium 3.5 - 5.1 mmol/L 3.4  3.7  3.4   Chloride 98 - 111 mmol/L 94  94  95   CO2 22 - 32 mmol/L 30  27  26    Calcium 8.9 - 10.3 mg/dL 8.1  8.1  8.1    PT: 08.6 INR: 2.3  Assessment/Plan:  Principal Problem:   Acute hypoxic respiratory failure (HCC) Active Problems:   S/P MVR (mitral valve replacement)   HLD (hyperlipidemia)   Hypothyroidism   Moderate protein-calorie malnutrition (HCC)   Congestive heart failure (HCC)   Chronic kidney disease   Normocytic anemia   Supratherapeutic INR   Urinary  tract infection   Delirium  Tina Wade is a 89 y.o.female with a 71-pack year history and past medical history significant for mitral valve replacement and prior L hip surgery presenting with difficulty getting up from her chair today and SOB and admitted for acute hypoxic respiratory failure on HD 7.  AHRF 2/2 HFpEF exacerbation Patient's volume status appears about the same today as yesterday. Patient 's oxygen saturations within goals between 88-92% on RA on exam. Denied difficulty breathing.  - Continue Lasix 40 mg BID - Continue trending O2 requirement  - Continue assessing volume status - Continue strict I/O   Reddened Skin on Bilateral Heels   Patient has reddened skin on bilateral heels under Mepilex dressings. Team asked nursing staff to put additional padding under heels to relieve pressure. Q2 hour turns ordered with patient primarily bed-bound right now.  - q2 hour turns  Constipation Patient reports last BM two weeks ago. Last documented BM in chart is 1/16. Patient taking Senna-docusate 1 tablet daily. Will increase to 2 tablets daily. - Start Senna-docusate 2 tablets daily  Urinary Retention Nursing reported patient  voided roughly 100 mL with bladder scan revealing 485. Per nursing, patient reports that she doesn't feel any pressure to urinate and denies pain in that area. Will ordered in-and-out cath.   UTI/Leukocytosis Improved. WBC 11.4 today from 12.9 yesterday. Last dose of Rocephin on 1/20. Patient remains afebrile.  - CBC tomorrow AM   Hx of Mechanical Mitral Valve Replacement/Valvular A-fib On Warfarin with pharmacy managing, appreciate assistance. INR stable at 2.3 with goal range of 2.5-3.5. - Continue warfarin dosing per pharmacy - Continue trending PT and INR   Macrocytic Anemia Stable with Hgb of 7.7 today from 7.7 yesterday. MCV increased to 104.5 today from 102.6 yesterday. Patient has iron deficiency anemia as TSAT ratio elevated on 1/17. RPI was 1.2 on 1/17 and IV iron was given on 1/18. Vitamin B12 was WNL and folate was WNL day before yesterday.  - Continue to monitor - CBC tomorrow AM   Hospital Delirium Patient A&O x3. She was very tired appearing on exam today. Hx of waxing and waning delirium during hospitalization.  - Delirium precautions    Hypothyroidism  - Continue home levothyroxine    Hip Osteoarthritis  Hip x-ray revealed worsening OA. Pt on Norco 5-325 mg PRN. Will continue to treat as pain can lead to delirium as well. - Continue Norco 5-325 mg PRN - Continue Voltaren gel   Hx of COPD Oxygen saturations within goal between 88-92%.  - Continue duonebs   HLD - Continue lipitor 80 mg    Hypokalemia  3.4 today from 3.7 yesterday. Given patient's poor PO intake and continued lasix use, will schedule potassium supplementation - Start potassium chloride 20 mEq BID   AKI Resolved.   Disposition Palliative consulted and met with patient and family yesterday. Appreciate recommendations and assistance. Will plan for SNF/rehab placement.     Diet: HH VTE:  Warfarin Code: DNR Limited PT: SNF/rehab   LOS: 7 days   Bubba Hales, Medical Student 08/08/2023,  7:25 AM

## 2023-08-08 NOTE — Progress Notes (Incomplete)
  Daily Progress Note   Patient Name: Tina Wade       Date: 08/08/2023 DOB: Dec 22, 1928  Age: 89 y.o. MRN#: [id number] Attending Physician: Ginnie Smart, MD Primary Care Physician: Tracey Harries, MD Admit Date: 08/01/2023 Length of Stay: 7 days  Reason for Consultation/Follow-up: {Reason for Consult:23484}  HPI/Patient Profile:  ***  Subjective:   Subjective: Chart Reviewed. Updates received. Patient Assessed. Created space and opportunity for patient  and family to explore thoughts and feelings regarding current medical situation.  Today's Discussion: ***  Review of Systems  Objective:   Vital Signs:  BP (!) 122/44 (BP Location: Right Arm) Comment: Simultaneous filing. User may not have seen previous data.  Pulse 69   Temp 98 F (36.7 C) (Axillary)   Resp 19   Ht 5\' 6"  (1.676 m)   Wt 83.4 kg   SpO2 91%   BMI 29.68 kg/m   Physical Exam  Palliative Assessment/Data: ***    Existing Vynca/ACP Documentation: ***  Assessment & Plan:   Impression: Present on Admission:  Acute hypoxic respiratory failure (HCC)  HLD (hyperlipidemia)  Hypothyroidism  ***  SUMMARY OF RECOMMENDATIONS   ***  Symptom Management:  ***  Code Status: {Palliative Code status:23503}  Prognosis: {Palliative Care Prognosis:23504}  Discharge Planning: {Palliative dispostion:23505}  Discussed with: ***  Thank you for allowing [place] to participate in the care of Tina Wade PMT will continue to support holistically.  Time Total: ***  Detailed review of medical records (labs, imaging, vital signs), medically appropriate exam, discussed with treatment team, counseling and education to patient, family, & staff, documenting clinical information, medication management, coordination of care  Wynne Dust, NP Palliative Medicine Team  Team Phone # [phone] (Nights/Weekends)  03/15/2021, 8:17 AM

## 2023-08-08 NOTE — Progress Notes (Signed)
   Advanced Care Planning Note   Patient Name: Tina Wade       Date: 08/08/2023 DOB: 1929/02/04  Age: 89 y.o. MRN#: [id number] Attending Physician: Ginnie Smart, MD Primary Care Physician: Tracey Harries, MD Admit Date: 08/01/2023 Length of Stay: 7 days  Advanced Care Planning Details:   Persons Present: ACP Persons Present: Patient, Patient's Son(s)  , and Patient's Daughter(s) Nickie Retort) Present: Palliative Medicine Provider Wynne Dust, NP  All persons present were open and agreeable to conversation about advanced care planning. Education offered on the importance of documentation and the logistics of securing advanced directives.   We had in-depth discussion on advanced directives, goals of care, CODE STATUS.  Education was offered on the following:   Medical treatment options available, Possible risks and benefits of each option/intervention, Available ACP documents able to be completed, and Importance/Benefit of completing ACP documents  Forms completed include:  DNR ([place]) and HCPOA (began completing HCPOA, to be finalized by notary and chaplain) These forms can be found in the ACP link in the area under the patient's picture  After conversation, patient and/or family determined the desired discharge disposition at this time would be: Skilled Nursing Facility for rehab with Palliative care service follow-up   Summary of ACP Decisions:   Confirmed DNR Began completing HCPOA documentation Spiritual care consult to finalize advance directives     Thank you for allowing [place] to participate in the care of LACHELL FANTASIA PMT will continue to support holistically.  Total ACP Time: 20 min  The time above is specific to time spend on advanced care planning. It does not include any separately billed services.  Signed by: Wynne Dust, DNP, AGNP-C Palliative Medicine Team  Team Phone # [phone] (Nights/Weekends)  08/08/2023, 8:26 AM

## 2023-08-09 DIAGNOSIS — L539 Erythematous condition, unspecified: Secondary | ICD-10-CM | POA: Diagnosis not present

## 2023-08-09 DIAGNOSIS — E785 Hyperlipidemia, unspecified: Secondary | ICD-10-CM

## 2023-08-09 DIAGNOSIS — I509 Heart failure, unspecified: Secondary | ICD-10-CM | POA: Diagnosis not present

## 2023-08-09 DIAGNOSIS — F05 Delirium due to known physiological condition: Secondary | ICD-10-CM | POA: Diagnosis not present

## 2023-08-09 DIAGNOSIS — K5900 Constipation, unspecified: Secondary | ICD-10-CM | POA: Diagnosis not present

## 2023-08-09 LAB — CBC
HCT: 27.2 % — ABNORMAL LOW (ref 36.0–46.0)
Hemoglobin: 8.5 g/dL — ABNORMAL LOW (ref 12.0–15.0)
MCH: 32.8 pg (ref 26.0–34.0)
MCHC: 31.3 g/dL (ref 30.0–36.0)
MCV: 105 fL — ABNORMAL HIGH (ref 80.0–100.0)
Platelets: 285 10*3/uL (ref 150–400)
RBC: 2.59 MIL/uL — ABNORMAL LOW (ref 3.87–5.11)
RDW: 20.2 % — ABNORMAL HIGH (ref 11.5–15.5)
WBC: 12.8 10*3/uL — ABNORMAL HIGH (ref 4.0–10.5)
nRBC: 0.2 % (ref 0.0–0.2)

## 2023-08-09 LAB — BASIC METABOLIC PANEL
Anion gap: 10 (ref 5–15)
BUN: 35 mg/dL — ABNORMAL HIGH (ref 8–23)
CO2: 30 mmol/L (ref 22–32)
Calcium: 8.3 mg/dL — ABNORMAL LOW (ref 8.9–10.3)
Chloride: 94 mmol/L — ABNORMAL LOW (ref 98–111)
Creatinine, Ser: 1.25 mg/dL — ABNORMAL HIGH (ref 0.44–1.00)
GFR, Estimated: 40 mL/min — ABNORMAL LOW (ref >60)
Glucose, Bld: 110 mg/dL — ABNORMAL HIGH (ref 70–99)
Potassium: 4 mmol/L (ref 3.5–5.1)
Sodium: 134 mmol/L — ABNORMAL LOW (ref 135–145)

## 2023-08-09 LAB — PROTIME-INR
INR: 2.8 — ABNORMAL HIGH (ref 0.8–1.2)
Prothrombin Time: 29.5 s — ABNORMAL HIGH (ref 11.4–15.2)

## 2023-08-09 MED ORDER — ENSURE ENLIVE PO LIQD: 237.0000 mL | Freq: Two times a day (BID) | ORAL | 12 refills | Status: AC

## 2023-08-09 MED ORDER — POTASSIUM CHLORIDE 20 MEQ PO PACK: 20.0000 meq | PACK | Freq: Every day | ORAL | Status: AC

## 2023-08-09 MED ORDER — HYDROCODONE-ACETAMINOPHEN 5-325 MG PO TABS: 1.0000 | ORAL_TABLET | Freq: Four times a day (QID) | ORAL | 0 refills | Status: AC | PRN

## 2023-08-09 MED ORDER — SENNOSIDES-DOCUSATE SODIUM 8.6-50 MG PO TABS: 2.0000 | ORAL_TABLET | Freq: Two times a day (BID) | ORAL | Status: AC

## 2023-08-09 MED ORDER — POTASSIUM CHLORIDE 20 MEQ PO PACK
20.0000 meq | PACK | Freq: Every day | ORAL | Status: DC
  Administered 2023-08-09: 20 meq via ORAL
  Filled 2023-08-09: qty 1

## 2023-08-09 MED ORDER — WARFARIN SODIUM 2 MG PO TABS: 4.0000 mg | ORAL_TABLET | Freq: Once | ORAL | Status: DC

## 2023-08-09 MED ORDER — IPRATROPIUM-ALBUTEROL 0.5-2.5 (3) MG/3ML IN SOLN: 3.0000 mL | Freq: Four times a day (QID) | RESPIRATORY_TRACT | Status: AC | PRN

## 2023-08-09 MED ORDER — WARFARIN SODIUM 2 MG PO TABS: 2.0000 mg | ORAL_TABLET | Freq: Once | ORAL | Status: DC

## 2023-08-09 MED ORDER — ATORVASTATIN CALCIUM 80 MG PO TABS: 80.0000 mg | ORAL_TABLET | Freq: Every day | ORAL | Status: AC

## 2023-08-09 MED ORDER — DICLOFENAC SODIUM 1 % EX GEL: 2.0000 g | Freq: Two times a day (BID) | CUTANEOUS | Status: AC

## 2023-08-09 NOTE — Care Management Important Message (Signed)
Important Message  Patient Details  Name: Tina Wade MRN: [id number] Date of Birth: September 29, 1928   Important Message Given:  Yes - Medicare IM     Renie Ora 08/09/2023, 10:57 AM

## 2023-08-09 NOTE — TOC Transition Note (Signed)
Transition of Care [facility]) - Discharge Note   Patient Details  Name: Tina Wade MRN: [id number] Date of Birth: 03-20-1929  Transition of Care [facility]) CM/SW Contact:  Michaela Corner, LCSWA Phone Number: 08/09/2023, 11:24 AM   Clinical Narrative:   [facility] will [facility] to: [facility] Anticipated DC date: 08/09/2023 Family notified: Diannia Ruder (dtr) Transport by: Sharin Mons   Per MD patient ready for DC to [facility]. RN to call report prior to discharge [phone]). RN, patient, patient's family, and facility notified of DC. Discharge Summary and FL2 sent to facility. DC packet on chart. Ambulance transport requested for patient.   CSW will sign off for now as social work intervention is no longer needed. Please consult [place] again if new needs arise.      Final next level of care: Skilled Nursing Facility Barriers to Discharge: Barriers Resolved   Patient Goals and CMS Choice Patient states their goals for this hospitalization and ongoing recovery are:: get better   Choice offered to / list presented to : Adult Children      Discharge Placement              Patient chooses bed at: [facility] Patient to be transferred to facility by: Ptr Name of family member notified: Diannia Ruder (dtr) Patient and family notified of of transfer: 08/09/23  Discharge Plan and Services Additional resources added to the After Visit Summary for   In-house Referral: NA Discharge Planning Services: CM Consult Post Acute Care Choice: NA          DME Arranged: N/A DME Agency: NA       HH Arranged: NA          Social Drivers of Health (SDOH) Interventions SDOH Screenings   Food Insecurity: No Food Insecurity (08/02/2023)  Housing: Low Risk  (08/02/2023)  Transportation Needs: No Transportation Needs (08/02/2023)  Utilities: Not At Risk (08/02/2023)  Financial Resource Strain: Low Risk  (07/23/2023)   Received from [facility]  Physical Activity: Unknown (09/12/2022)   Received  from [facility]  Social Connections: Moderately Isolated (08/02/2023)  Stress: No Stress Concern Present (05/06/2023)   Received from [facility]  Tobacco Use: High Risk (08/01/2023)     Readmission Risk Interventions     No data to display

## 2023-08-09 NOTE — Discharge Instructions (Signed)
You were admitted to the hospital due to breathing difficulty that was caused by an exacerbation of your heart failure.  You are treated with an IV diuretic and your breathing improved so you were transition back to your normal dose of Lasix. (Diuretic).  You were also treated for a suspected urinary tract infection and completed antibiotics for this.  The last few days you seem to be retaining some urine as we have had to use a catheter to get urine out.  The skilled nursing facility and help manage this further. They will consider medication or potentially a permanent catheter if this continues to be problematic.  You will be discharged to a skilled nursing facility for further management, but please follow-up with your primary care doctor regarding this admission.  All the best.

## 2023-08-09 NOTE — Discharge Summary (Signed)
Name: Tina Wade MRN: [id number] DOB: December 27, 1928 89 y.o. PCP: Tracey Harries, MD  Date of Admission: 08/01/2023 11:24 AM Date of Discharge:  08/09/2023 Attending Physician: Dr.  Johny Sax   DISCHARGE DIAGNOSIS:  Primary Problem: Acute hypoxic respiratory failure [facility])   Hospital Problems: Principal Problem:   Acute hypoxic respiratory failure (HCC) Active Problems:   H/O mitral valve replacement   HLD (hyperlipidemia)   Hypothyroidism   Moderate protein-calorie malnutrition (HCC)   Congestive heart failure (HCC)   Chronic kidney disease   Normocytic anemia   Supratherapeutic INR   Urinary tract infection   Delirium    DISCHARGE MEDICATIONS:   Allergies as of 08/09/2023       Reactions   Neurontin [gabapentin] Other (See Comments)   Weakness    Percocet [oxycodone-acetaminophen] Other (See Comments)   Percocet 5-325mg  dose too strong, OK to take Norco 5-325mg .        Medication List     STOP taking these medications    potassium chloride SA 20 MEQ tablet Commonly known as: KLOR-CON M   rosuvastatin 40 MG tablet Commonly known as: CRESTOR   sulfamethoxazole-trimethoprim 800-160 MG tablet Commonly known as: BACTRIM DS       TAKE these medications    acetaminophen 325 MG tablet Commonly known as: TYLENOL Take 650 mg by mouth every 6 (six) hours as needed for moderate pain (pain score 4-6), fever or headache.   albuterol 108 (90 Base) MCG/ACT inhaler Commonly known as: VENTOLIN HFA Inhale 2 puffs into the lungs as needed.   atorvastatin 80 MG tablet Commonly known as: LIPITOR Take 1 tablet (80 mg total) by mouth daily. Start taking on: August 10, 2023   diclofenac Sodium 1 % Gel Commonly known as: VOLTAREN Apply 2 g topically 2 (two) times daily at 10 AM and 5 PM. What changed:  how much to take when to take this reasons to take this   feeding supplement Liqd Take 237 mLs by mouth 2 (two) times daily between meals.   ferrous  sulfate 325 (65 FE) MG tablet Take 325 mg by mouth daily.   furosemide 40 MG tablet Commonly known as: LASIX Take 40 mg by mouth 2 (two) times daily.   HYDROcodone-acetaminophen 5-325 MG tablet Commonly known as: NORCO/VICODIN Take 1 tablet by mouth every 6 (six) hours as needed for severe pain (pain score 7-10).   ipratropium-albuterol 0.5-2.5 (3) MG/3ML Soln Commonly known as: DUONEB Take 3 mLs by nebulization every 6 (six) hours as needed.   levETIRAcetam 750 MG 24 hr tablet Commonly known as: KEPPRA XR Take 750 mg by mouth in the morning and at bedtime.   levothyroxine 100 MCG tablet Commonly known as: SYNTHROID Take 100 mcg by mouth daily before breakfast.   ondansetron 4 MG disintegrating tablet Commonly known as: ZOFRAN-ODT 4mg  ODT q4 hours prn nausea/vomit   potassium chloride 20 MEQ packet Commonly known as: KLOR-CON Take 20 mEq by mouth daily. Start taking on: August 10, 2023   senna-docusate 8.6-50 MG tablet Commonly known as: Senokot-S Take 2 tablets by mouth 2 (two) times daily.   spironolactone 25 MG tablet Commonly known as: ALDACTONE Take 25 mg by mouth 2 (two) times daily.   Stiolto Respimat 2.5-2.5 MCG/ACT Aers Generic drug: Tiotropium Bromide-Olodaterol Inhale 2 puffs into the lungs daily.   triamcinolone cream 0.1 % Commonly known as: KENALOG Apply 1 application topically as needed for rash.   warfarin 2 MG tablet Commonly known as: COUMADIN Take 2-4  mg by mouth See admin instructions. Take 1 tablet (2mg ) around noon every Sunday, Tuesday, Thursday, Saturday Take 2 tablets (4mg ) around noon every Monday, Wednesday, Friday.               Discharge Care Instructions  (From admission, onward)           Start     Ordered   08/09/23 0000  Discharge wound care:       Comments: Pressure offload on bilateral heals   08/09/23 1143            DISPOSITION AND FOLLOW-UP:  Ms.Britt C Hirani was discharged from [facility] in Stable condition. At the hospital follow up visit please address:  Urinary Retention Patient noted to be retaining urine and a significant discharge.  Received to in and out caths.  Day of discharge bladder scan showed less than 200 mL. Consider chronic foley if she continues to retain vs medication e.g. Bethanecol.   Follow-up Appointments:  Contact information for after-discharge care     Destination     [facility] .   Service: Skilled Nursing Contact information: [address] [place] [address] [phone]                     HOSPITAL COURSE:  Patient Summary:  Principal Problem:   Acute hypoxic respiratory failure (HCC) Active Problems:   H/O mitral valve replacement   HLD (hyperlipidemia)   Hypothyroidism   Moderate protein-calorie malnutrition (HCC)   Congestive heart failure (HCC)   Chronic kidney disease   Normocytic anemia   Supratherapeutic INR   Urinary tract infection   Delirium   AHRF 2/2 HFpEF exacerbation  Patient presented to the ED with SOB and weakness. Physical exam revealed pitting peripheral edema, JVD, crackles and rhonci on exam. She was admitted to the IMTS due to acute hypoxic respiratory failure secondary to heart failure exacerbation. She was started on 40 mg IV lasix BID. Echo revealed EF 50-55%. She was transitioned to home lasix dose of 40 mg oral BID. Patient's volume status and oxygen saturations stable and within goal (> 88%) upon discharge.  Urinary Retention  Day prior to discharge, nursing reported low voiding volume. Bladder scan revealed urinary retention, so in-and-out cath initiated. Patient continued to retained urine overnight with additional need for in-and-out cath. Bladder scan prior to discharge ~170 mL. Suspect this is due to functional incontinence as patient has been bed-bound during admission. Considered neurogenic bladder, but functional  incontinence more likely. Recommend assessing for urinary retention and consideration of medication management or foley catheter if needed.  Reddened Skin on Bilateral Heels   Mild redness on bilateral heels. Mepilex on heels for added support. Patient was q2 hour turns during hospitalization as she was bed-bound. Recommend continued position changes if she continued to be immobile.   Constipation Last documented BM 1/18. Patient started on senna-docusate 1 tablet and was increased to 2 tablets per day yesterday. No BM reported since increase of senna-docusate.  UTI/Leukocytosis Elevated WBC count on admission. She was started on bactrim outpatient for empiric UTI treatment. We transitioned her antibiotic to Rocephin given patient was hyperkalemic. She ended her last dose of Rocephin on 1/20.   Hx of Mechanical Mitral Valve Replacement/Valvular A-fib Patient has hx of mitral valve replacement and a-fib. Home warfarin started on admission. Her INR elevated during admission, so pharmacy team managed dosing. Her INR was 2.8, within goal  range of 2.5-3.5, on discharge.    Macrocytic Anemia Patient found to have iron deficiency anemia as TSAT ratio elevated on 1/17. RPI was 1.2 on 1/17 and IV iron was given on 1/18. MCV increased to macrocytic ranges. Vitamin B12 and folate were WNL. Hbg stable at 8.5 on discharge.   Hospital Delirium Patient exhibited some waxing and waning delirium during hospitalization. She was alert and oriented x3 on discharge.    Hypothyroidism  TSH WNL on admission. Home levothyroxine continued during admission.    Hip Osteoarthritis  History of percutaneous fixation of L femoral neck fracture in 02/23/2019. Followed outpatient with orthopedic physician. Patient resumed home Norco 5-325 mg PRN for pain and started on Voltaren gel.   Hx of COPD Stable during admission. No wheezing appreciated. She was started on duonebs. Home inhalers were held given her decreased  mentation. Oxygen saturations within goal between 88-92% on discharge. Home inhalers restarted upon discharge.    HLD Started on home Crestor. Transitioned to Lipitor given concurrent AKI as Lipitor does not have to be renally dosed. Recommend continuing Lipitor with outpatient follow-up.   Hypokalemia  Patient with episodic hypokalemia during admission with potassium repletion. Patient resumed home potassium supplementation. Recommend continued supplementation as patient continues to be on lasix.   AKI Patient's creatine elevated during admission which was most likely pre-renal in the setting of CHF exacerbation. Improved with diuresis and completely resolved upon discharge.   Goals of Care  Palliative care consulted and met with family. Referral to palliative has been placed for outpatient support if needed  DISCHARGE INSTRUCTIONS:   Discharge Instructions     (HEART FAILURE PATIENTS) Call MD:  Anytime you have any of the following symptoms: 1) 3 pound weight gain in 24 hours or 5 pounds in 1 week 2) shortness of breath, with or without a dry hacking cough 3) swelling in the hands, feet or stomach 4) if you have to sleep on extra pillows at night in order to breathe.   Complete by: As directed    Amb Referral to Palliative Care   Complete by: As directed    Call MD for:  difficulty breathing, headache or visual disturbances   Complete by: As directed    Call MD for:  extreme fatigue   Complete by: As directed    Call MD for:  hives   Complete by: As directed    Call MD for:  persistant dizziness or light-headedness   Complete by: As directed    Call MD for:  persistant nausea and vomiting   Complete by: As directed    Call MD for:  redness, tenderness, or signs of infection (pain, swelling, redness, odor or green/yellow discharge around incision site)   Complete by: As directed    Call MD for:  severe uncontrolled pain   Complete by: As directed    Call MD for:  temperature  >100.4   Complete by: As directed    Diet - low sodium heart healthy   Complete by: As directed    Discharge wound care:   Complete by: As directed    Pressure offload on bilateral heals   Increase activity slowly   Complete by: As directed        SUBJECTIVE:  Patient seen and evaluated while resting in bed. She reports she is ok. She reports eating her breakfast this AM and drinking ok. She denies SOB. Denies sensation to urinate or feelings of pressure or fullness in her lower abdominal  region. No other complaints.  Discharge Vitals:   BP (!) 127/48 (BP Location: Right Arm)   Pulse 71   Temp 97.9 F (36.6 C) (Oral)   Resp 20   Ht 5\' 6"  (1.676 m)   Wt 82.4 kg   SpO2 (!) 88%   BMI 29.32 kg/m   OBJECTIVE:  Physical Exam Constitutional:      General: She is not in acute distress. Cardiovascular:     Rate and Rhythm: Normal rate.  Pulmonary:     Effort: Pulmonary effort is normal.     Breath sounds: Rales present.  Abdominal:     General: Bowel sounds are normal.     Palpations: Abdomen is soft.  Musculoskeletal:     Right lower leg: No edema.     Left lower leg: No edema.  Skin:    General: Skin is warm and dry.  Neurological:     Mental Status: She is alert and oriented to person, place, and time.   Pertinent Labs, Studies, and Procedures:     Latest Ref Rng & Units 08/09/2023    2:57 AM 08/08/2023    2:37 AM 08/07/2023    2:39 AM  CBC  WBC 4.0 - 10.5 K/uL 12.8  11.4  12.9   Hemoglobin 12.0 - 15.0 g/dL 8.5  7.7  7.7   Hematocrit 36.0 - 46.0 % 27.2  25.4  23.9   Platelets 150 - 400 K/uL 285  261  250        Latest Ref Rng & Units 08/09/2023    2:57 AM 08/08/2023    2:37 AM 08/07/2023    2:39 AM  CMP  Glucose 70 - 99 mg/dL 295  621  95   BUN 8 - 23 mg/dL 35  36  41   Creatinine 0.44 - 1.00 mg/dL 3.08  6.57  8.46   Sodium 135 - 145 mmol/L 134  133  131   Potassium 3.5 - 5.1 mmol/L 4.0  3.4  3.7   Chloride 98 - 111 mmol/L 94  94  94   CO2 22 - 32 mmol/L 30   30  27    Calcium 8.9 - 10.3 mg/dL 8.3  8.1  8.1     DG HIP PORT UNILAT WITH PELVIS 1V LEFT Result Date: 08/02/2023 CLINICAL DATA:  Hip pain. EXAM: DG HIP (WITH OR WITHOUT PELVIS) 1V PORT LEFT COMPARISON:  02/23/2019 FINDINGS: Three screws traverse the proximal left femur. Previous femoral neck fracture has healed. Progressive and advanced osteoarthritis of the left hip with joint space narrowing, acetabular and femoral head neck spurring. Pubic rami are grossly intact. The bones are diffusely under mineralized which limits detailed assessment. IMPRESSION: 1. Progressive and advanced osteoarthritis of the left hip. 2. Previous femoral neck fracture has healed. 3. Osteopenia/osteoporosis. Electronically Signed   By: Narda Rutherford M.D.   On: 08/02/2023 21:54   ECHOCARDIOGRAM COMPLETE Result Date: 08/02/2023    ECHOCARDIOGRAM REPORT   Patient Name:   THRESIA LANZA Date of Exam: 08/02/2023 Medical Rec #:  [id number]        Height:       66.0 in Accession #:    [id number]       Weight:       176.0 lb Date of Birth:  06-12-1929       BSA:          1.894 m Patient Age:    89 years         BP:  141/47 mmHg Patient Gender: F                HR:           75 bpm. Exam Location:  Inpatient Procedure: 2D Echo, Cardiac Doppler and Color Doppler Indications:    acute respiratory distress  History:        Patient has prior history of Echocardiogram examinations, most                 recent 05/25/2018. CHF, chronic kidney disease and COPD, Aortic                 Valve Disease, Arrythmias:Atrial Fibrillation; Risk                 Factors:Hypertension, Current Smoker and Dyslipidemia.                  Mitral Valve: 29 mm St. Jude mechanical valve valve is present                 in the mitral position. Procedure Date: 1996.  Sonographer:    Delcie Roch RDCS Referring Phys: [id number] JON KNAPP IMPRESSIONS  1. Left ventricular ejection fraction, by estimation, is 50 to 55%. The left ventricle has low normal  function. The left ventricle demonstrates global hypokinesis. There is mild concentric left ventricular hypertrophy. Left ventricular diastolic parameters are consistent with Grade I diastolic dysfunction (impaired relaxation).  2. Right ventricular systolic function is normal. The right ventricular size is normal. There is normal pulmonary artery systolic pressure.  3. Left atrial size was severely dilated.  4. The mitral valve has been repaired/replaced. No evidence of mitral valve regurgitation. The mean mitral valve gradient is 3.8 mmHg. There is a 29 mm St. Jude mechanical valve present in the mitral position. Procedure Date: 1996. Echo findings are consistent with normal structure and function of the mitral valve prosthesis.  5. The aortic valve is calcified. There is severe calcifcation of the aortic valve. There is moderate thickening of the aortic valve. Aortic valve regurgitation is moderate. Moderate aortic valve stenosis. Aortic regurgitation PHT measures 348 msec. Aortic valve mean gradient measures 22.8 mmHg. Aortic valve Vmax measures 3.25 m/s.  6. Pulmonic valve regurgitation is moderate. FINDINGS  Left Ventricle: Left ventricular ejection fraction, by estimation, is 50 to 55%. The left ventricle has low normal function. The left ventricle demonstrates global hypokinesis. The left ventricular internal cavity size was normal in size. There is mild concentric left ventricular hypertrophy. Left ventricular diastolic parameters are consistent with Grade I diastolic dysfunction (impaired relaxation). Right Ventricle: The right ventricular size is normal. No increase in right ventricular wall thickness. Right ventricular systolic function is normal. There is normal pulmonary artery systolic pressure. The tricuspid regurgitant velocity is 2.51 m/s, and  with an assumed right atrial pressure of 8 mmHg, the estimated right ventricular systolic pressure is 33.2 mmHg. Left Atrium: Left atrial size was severely  dilated. Right Atrium: Right atrial size was normal in size. Pericardium: There is no evidence of pericardial effusion. Mitral Valve: The mitral valve has been repaired/replaced. No evidence of mitral valve regurgitation. There is a 29 mm St. Jude mechanical valve present in the mitral position. Procedure Date: 1996. Echo findings are consistent with normal structure and function of the mitral valve prosthesis. MV peak gradient, 11.5 mmHg. The mean mitral valve gradient is 3.8 mmHg. Tricuspid Valve: The tricuspid valve is normal in structure. Tricuspid valve regurgitation is mild. Aortic Valve: The  aortic valve is calcified. There is severe calcifcation of the aortic valve. There is moderate thickening of the aortic valve. Aortic valve regurgitation is moderate. Aortic regurgitation PHT measures 348 msec. Moderate aortic stenosis is present. Aortic valve mean gradient measures 22.8 mmHg. Aortic valve peak gradient measures 42.2 mmHg. Aortic valve area, by VTI measures 1.14 cm. Pulmonic Valve: The pulmonic valve was normal in structure. Pulmonic valve regurgitation is moderate. Aorta: The aortic root and ascending aorta are structurally normal, with no evidence of dilitation. IAS/Shunts: No atrial level shunt detected by color flow Doppler.  LEFT VENTRICLE PLAX 2D LVIDd:         4.60 cm LVIDs:         3.50 cm LV PW:         1.10 cm LV IVS:        1.20 cm LVOT diam:     2.40 cm LV SV:         76 LV SV Index:   40 LVOT Area:     4.52 cm  RIGHT VENTRICLE             IVC RV Basal diam:  3.10 cm     IVC diam: 2.30 cm RV S prime:     10.20 cm/s LEFT ATRIUM              Index        RIGHT ATRIUM           Index LA diam:        4.90 cm  2.59 cm/m   RA Area:     19.60 cm LA Vol (A2C):   118.0 ml 62.30 ml/m  RA Volume:   55.40 ml  29.25 ml/m LA Vol (A4C):   88.4 ml  46.67 ml/m LA Biplane Vol: 105.0 ml 55.44 ml/m  AORTIC VALVE                     PULMONIC VALVE AV Area (Vmax):    1.18 cm      PR End Diast Vel: 7.78  msec AV Area (Vmean):   1.12 cm AV Area (VTI):     1.14 cm AV Vmax:           324.80 cm/s AV Vmean:          217.400 cm/s AV VTI:            0.668 m AV Peak Grad:      42.2 mmHg AV Mean Grad:      22.8 mmHg LVOT Vmax:         85.02 cm/s LVOT Vmean:        53.760 cm/s LVOT VTI:          0.168 m LVOT/AV VTI ratio: 0.25 AI PHT:            348 msec  AORTA Ao Root diam: 3.20 cm Ao Asc diam:  3.70 cm MITRAL VALVE             TRICUSPID VALVE MV Area VTI:  2.31 cm   TR Peak grad:   25.2 mmHg MV Peak grad: 11.5 mmHg  TR Vmax:        251.00 cm/s MV Mean grad: 3.8 mmHg MV Vmax:      1.69 m/s   SHUNTS MV Vmean:     85.8 cm/s  Systemic VTI:  0.17 m  Systemic Diam: 2.40 cm Clearnce Hasten Electronically signed by Clearnce Hasten Signature Date/Time: 08/02/2023/4:39:11 PM    Final    US RENAL Result Date: 08/02/2023 CLINICAL DATA:  Acute kidney injury. EXAM: RENAL / URINARY TRACT ULTRASOUND COMPLETE COMPARISON:  Renal stone CT 07/28/2023 FINDINGS: Right Kidney: Renal measurements: 10.5 x 3.6 x 7.0 cm = volume: 137.3 mL. Echogenicity within normal limits. No mass or hydronephrosis visualized. Left Kidney: Not well seen on this ultrasound. Significant overlapping bowel gas and soft tissue. On prior CT of the left kidney was atrophic. Bladder: Moderately distended.  Preserved contour Other: None. IMPRESSION: No right-sided renal collecting system dilatation. The left kidney is poorly seen today. Please correlate with prior CT 07/28/2023. Electronically Signed   By: Karen Kays M.D.   On: 08/02/2023 15:59   DG Chest Portable 1 View Result Date: 08/01/2023 CLINICAL DATA:  Shortness of breath.  Cough. EXAM: PORTABLE CHEST 1 VIEW COMPARISON:  02/22/2019. FINDINGS: Low lung volume. There are increased interstitial markings, which is nonspecific and may be due to underlying chronic interstitial lung disease versus atypical pneumonia. No frank pulmonary edema. Bilateral lung fields are clear. Bilateral  costophrenic angles are clear. Stable cardio-mediastinal silhouette. There are surgical staples along the heart border and sternotomy wires, status post CABG (coronary artery bypass graft). No acute osseous abnormalities. The soft tissues are within normal limits. IMPRESSION: *Increased interstitial markings, as described above. Electronically Signed   By: Jules Schick M.D.   On: 08/01/2023 13:40     Signed: Carmina Miller, DO  Internal Medicine Resident, PGY-1 [facility]  Pager: [phone] 1:08 PM, 08/09/2023

## 2023-08-09 NOTE — Progress Notes (Signed)
PHARMACY - ANTICOAGULATION CONSULT NOTE  Pharmacy Consult for warfarin dosing. Indication: atrial fibrillation and history of mitral valve mechanical prosthetic valve replacement requiring INR target range 2.5 - 3.5.   Allergies  Allergen Reactions   Neurontin [Gabapentin] Other (See Comments)    Weakness    Percocet [Oxycodone-Acetaminophen] Other (See Comments)    Percocet 5-325mg  dose too strong, OK to take Norco 5-325mg .    Patient Measurements: Height: 5\' 6"  (167.6 cm) Weight: 82.4 kg (181 lb 10.5 oz) IBW/kg (Calculated) : 59.3  Vital Signs: Temp: 98 F (36.7 C) (01/23 0711) Temp Source: Oral (01/23 0711) BP: 117/48 (01/23 0711) Pulse Rate: 67 (01/23 0711)  Labs: Recent Labs    08/07/23 0239 08/08/23 0237 08/09/23 0257  HGB 7.7* 7.7* 8.5*  HCT 23.9* 25.4* 27.2*  PLT 250 261 285  LABPROT 25.3* 25.9* 29.5*  INR 2.3* 2.3* 2.8*  CREATININE 1.34* 1.21* 1.25*    Estimated Creatinine Clearance: 31.1 mL/min (A) (by C-G formula based on SCr of 1.25 mg/dL (H)).   Medical History: Past Medical History:  Diagnosis Date   Arrhythmia    chronic atrial fib. with controlled rate ,on warfarin therapy   High cholesterol    Hypertension    Lower extremity edema    mild -stable   Mitral valve stenosis 10/1994   s/p St. Jude mechanical valve placement -36M-101 MODEL   Stroke (HCC) 08/2010   from office visit 2013- the setting of normal INR of 2.8 ;therefore increased goal to 3.5   Thyroid disease    hypothyroidism on synthroid    Medications:  Scheduled:   atorvastatin  80 mg Oral Daily   diclofenac Sodium  2 g Topical BID   feeding supplement  237 mL Oral BID BM   furosemide  40 mg Oral BID   Gerhardt's butt cream   Topical Daily   ipratropium-albuterol  3 mL Nebulization BID   levothyroxine  100 mcg Oral QAC breakfast   potassium chloride  20 mEq Oral BID   senna-docusate  2 tablet Oral BID   Warfarin - Pharmacist Dosing Inpatient   Does not apply q1600     Assessment: INR 2.8 at 0257 hours today. Platelet count stable. Hemoglobin stable.  Goal of Therapy:  Target range for indication, 2.5 - 3.5. INR therapeutic today.  Monitor platelets by anticoagulation protocol: Yes>>285 K/uL today.   Plan:  Will dose with 2 mg, suspecting that INR will continue to increase relative to the past  three consecutive doses of 4 mg. Prior to admission, home regimen had been 2 mg TThSaSun and 4 mg on MWF.  Elicia Lamp, PharmD, CPP 08/09/2023,7:30 AM

## 2023-08-09 NOTE — Progress Notes (Signed)
Physical Therapy Treatment Patient Details Name: Tina Wade MRN: [id number] DOB: 04/13/1929 Today's Date: 08/09/2023   History of Present Illness 89 y.o. female who presented on 08/01/23 with  SOB, acute hypoxic respiratory failure, and CHF. Imaging: Progressive and advanced osteoarthritis of the left hip, osteopenia/osteoporosis, and   aortic atherosclerosis. Of note, pt had PNA 2-3 weeks ago, chronic L hip pain secondary to proximal femur fx in 2020, and recent UTI. PMHx: arrhythmia, high cholesterol, HTN, lower extremity edema, mitral valve stenosis, CVA, hypothyroid diease.    PT Comments  Pt required maxA x1-2 for bed mobility. Introduced sara stedy to attempt STS, totalA x2 with no visible activation from pt. Unable to further mobility d/t pt refusal and c/o pain, fatigue, and lethargy. Educated pt on BLE HEP she could perform to maintain strength and the importance of engagement with skilled PT services. Pt stated "I understand you have a job to do, and I am sure you are good at, but I want to be left alone." Will continue to follow acutely and advance appropriately.    If plan is discharge home, recommend the following: Assistance with cooking/housework;Assist for transportation;Two people to help with walking and/or transfers;Two people to help with bathing/dressing/bathroom;Direct supervision/assist for medications management;Direct supervision/assist for financial management;Help with stairs or ramp for entrance   Can travel by private vehicle     No  Equipment Recommendations       Recommendations for Other Services       Precautions / Restrictions Precautions Precautions: Fall Restrictions Weight Bearing Restrictions Per Provider Order: No     Mobility  Bed Mobility Overal bed mobility: Needs Assistance Bed Mobility: Rolling, Supine to Sit, Sit to Supine Rolling: Max assist, Used rails, +2 for physical assistance   Supine to sit: Max assist, +2 for physical  assistance, Used rails, HOB elevated Sit to supine: Total assist   General bed mobility comments: VC/TC provided to facilitate bed mobility. Rolled R/L multiple time to get on/off bedpan and replace bed pads. MaxA x2 at trunk to obtain upright position. Pt displayed limited scooting ability fwd/bkwd/sideways. MaxA at bed pad to reposition hips.    Transfers Overall transfer level: Needs assistance Equipment used: None Transfers: Sit to/from Stand Sit to Stand: +2 physical assistance, +2 safety/equipment, Total assist           General transfer comment: Attempted STS from elevated surface using sara stedy. Pt engaged in set up with appropriate hand/foot placement.  TotalA x2 with no visible participation from pt. Pt refused to attempt again. Transfer via Lift Equipment: Stedy  Ambulation/Gait               General Gait Details: Unable at this time   Comptroller Bed    Modified Rankin (Stroke Patients Only)       Balance Overall balance assessment: Needs assistance Sitting-balance support: Feet supported, Single extremity supported Sitting balance-Leahy Scale: Fair Sitting balance - Comments: Pt sat EOB with Sup and intermittent UE support.       Standing balance comment: Unable to obtain stand                            Cognition Arousal: Alert Behavior During Therapy: Flat affect Overall Cognitive Status: Within Functional Limits for tasks assessed  Exercises General Exercises - Lower Extremity Ankle Circles/Pumps: Both (Instructed pt to complete 10 reps 3x/day while in bed.) Quad Sets: Both (Instructed pt to complete 10 reps 3x/day while in bed.) Gluteal Sets: Both (Instructed pt to complete 10 reps 3x/day while in bed.) Heel Slides: Both (Instructed pt to complete 10 reps 3x/day while in bed.) Hip ABduction/ADduction: Both (Instructed pt to  complete 10 reps 3x/day while in bed.) Straight Leg Raises: Both (Instructed pt to complete 10 reps 3x/day while in bed.)    General Comments General comments (skin integrity, edema, etc.): VSS on RA.      Pertinent Vitals/Pain Pain Assessment Pain Assessment: 0-10 Pain Score: 5  Faces Pain Scale: Hurts little more Pain Location: L hip Pain Descriptors / Indicators: Discomfort, Guarding Pain Intervention(s): Limited activity within patient's tolerance, Monitored during session    Home Living                          Prior Function            PT Goals (current goals can now be found in the care plan section) Acute Rehab PT Goals Patient Stated Goal: Get rid of this chest congestion Progress towards PT goals: Not progressing toward goals - comment    Frequency    Min 1X/week      PT Plan      Co-evaluation              AM-PAC PT "6 Clicks" Mobility   Outcome Measure  Help needed turning from your back to your side while in a flat bed without using bedrails?: Total Help needed moving from lying on your back to sitting on the side of a flat bed without using bedrails?: Total Help needed moving to and from a bed to a chair (including a wheelchair)?: Total Help needed standing up from a chair using your arms (e.g., wheelchair or bedside chair)?: Total Help needed to walk in hospital room?: Total Help needed climbing 3-5 steps with a railing? : Total 6 Click Score: 6    End of Session Equipment Utilized During Treatment: Gait belt Activity Tolerance: Patient limited by pain;Patient limited by fatigue;Patient limited by lethargy Patient left: in bed;with bed alarm set;with call bell/phone within reach Nurse Communication: Mobility status;Patient requests pain meds PT Visit Diagnosis: Unsteadiness on feet (R26.81);Other abnormalities of gait and mobility (R26.89);Muscle weakness (generalized) (M62.81);Difficulty in walking, not elsewhere classified  (R26.2);Pain     Time: 1610-9604 PT Time Calculation (min) (ACUTE ONLY): 35 min  Charges:    $Therapeutic Exercise: 8-22 mins $Therapeutic Activity: 8-22 mins                      Cheri Guppy, PT, DPT Acute Rehabilitation Services Office: [phone] Secure Chat Preferred   Richardson Chiquito 08/09/2023, 3:32 PM

## 2023-08-09 NOTE — Progress Notes (Signed)
Patient report given to nurse [place] at [facility]. Elnita Maxwell, RN

## 2023-08-09 NOTE — TOC Progression Note (Addendum)
Transition of Care [facility]) - Progression Note    Patient Details  Name: Tina Wade MRN: [id number] Date of Birth: 1929-06-22  [facility] of [facility]) CM/SW Contact  Michaela Corner, [place] Phone Number: 08/09/2023, 10:26 AM  Clinical Narrative:   CSW spoke with [place] (pts dtr) and at this time family has chosen, [employer].   CSW spoke with [facility] admissions coordinator [phone]) and they have bed availability today if pt is medically stable to dc. CSW notified treatment team of bed availability. Per teaching service, awaiting follow up on urinary retention.   11:09AM: Per teaching service, will proceed with pts dc. CSW updated facility of dc today. CSW asked Tresa Endo, admissions coordinator, about palliative care following pt while at [facility]. Tresa Endo states order for palliative care services will need to be on dc summary. CSW notified teaching service.   TOC will continue to follow.    Expected Discharge Plan: Skilled Nursing Facility Barriers to Discharge: Continued Medical Work up, Other (must enter comment) (Waiting on family decision of SNF)  Expected Discharge Plan and Services In-house Referral: NA Discharge Planning Services: CM Consult Post Acute Care Choice: NA Living arrangements for the past 2 months: Single Family Home                 DME Arranged: N/A DME Agency: NA       HH Arranged: NA           Social Determinants of Health (SDOH) Interventions SDOH Screenings   Food Insecurity: No Food Insecurity (08/02/2023)  Housing: Low Risk  (08/02/2023)  Transportation Needs: No Transportation Needs (08/02/2023)  Utilities: Not At Risk (08/02/2023)  Financial Resource Strain: Low Risk  (07/23/2023)   Received from [facility]  Physical Activity: Unknown (09/12/2022)   Received from [facility]  Social Connections: Moderately Isolated (08/02/2023)  Stress: No Stress Concern Present (05/06/2023)   Received from [facility]  Tobacco Use:  High Risk (08/01/2023)    Readmission Risk Interventions     No data to display

## 2023-08-21 ENCOUNTER — Encounter (HOSPITAL_COMMUNITY): Payer: Self-pay | Admitting: Pharmacy Technician

## 2023-08-21 ENCOUNTER — Emergency Department (HOSPITAL_COMMUNITY)
Admission: EM | Admit: 2023-08-21 | Discharge: 2023-08-21 | Disposition: A | Discharge status: Home / Self Care | Payer: Medicare Other | Attending: Emergency Medicine | Admitting: Emergency Medicine

## 2023-08-21 DIAGNOSIS — Z7901 Long term (current) use of anticoagulants: Secondary | ICD-10-CM | POA: Insufficient documentation

## 2023-08-21 DIAGNOSIS — E875 Hyperkalemia: Secondary | ICD-10-CM

## 2023-08-21 DIAGNOSIS — I509 Heart failure, unspecified: Secondary | ICD-10-CM | POA: Diagnosis not present

## 2023-08-21 DIAGNOSIS — Z79899 Other long term (current) drug therapy: Secondary | ICD-10-CM | POA: Diagnosis not present

## 2023-08-21 DIAGNOSIS — Z515 Encounter for palliative care: Secondary | ICD-10-CM

## 2023-08-21 DIAGNOSIS — E039 Hypothyroidism, unspecified: Secondary | ICD-10-CM | POA: Insufficient documentation

## 2023-08-21 DIAGNOSIS — N189 Chronic kidney disease, unspecified: Secondary | ICD-10-CM | POA: Insufficient documentation

## 2023-08-21 LAB — I-STAT CHEM 8, ED
BUN: 48 mg/dL — ABNORMAL HIGH (ref 8–23)
Calcium, Ion: 0.95 mmol/L — ABNORMAL LOW (ref 1.15–1.40)
Chloride: 90 mmol/L — ABNORMAL LOW (ref 98–111)
Creatinine, Ser: 1 mg/dL (ref 0.44–1.00)
Glucose, Bld: 103 mg/dL — ABNORMAL HIGH (ref 70–99)
HCT: 25 % — ABNORMAL LOW (ref 36.0–46.0)
Hemoglobin: 8.5 g/dL — ABNORMAL LOW (ref 12.0–15.0)
Potassium: 6.5 mmol/L (ref 3.5–5.1)
Sodium: 129 mmol/L — ABNORMAL LOW (ref 135–145)
TCO2: 36 mmol/L — ABNORMAL HIGH (ref 22–32)

## 2023-08-21 LAB — CBC WITH DIFFERENTIAL/PLATELET
Abs Immature Granulocytes: 0.03 10*3/uL (ref 0.00–0.07)
Basophils Absolute: 0 10*3/uL (ref 0.0–0.1)
Basophils Relative: 0 %
Eosinophils Absolute: 0.1 10*3/uL (ref 0.0–0.5)
Eosinophils Relative: 1 %
HCT: 26.5 % — ABNORMAL LOW (ref 36.0–46.0)
Hemoglobin: 8.1 g/dL — ABNORMAL LOW (ref 12.0–15.0)
Immature Granulocytes: 1 %
Lymphocytes Relative: 6 %
Lymphs Abs: 0.3 10*3/uL — ABNORMAL LOW (ref 0.7–4.0)
MCH: 33.3 pg (ref 26.0–34.0)
MCHC: 30.6 g/dL (ref 30.0–36.0)
MCV: 109.1 fL — ABNORMAL HIGH (ref 80.0–100.0)
Monocytes Absolute: 0.5 10*3/uL (ref 0.1–1.0)
Monocytes Relative: 9 %
Neutro Abs: 4.4 10*3/uL (ref 1.7–7.7)
Neutrophils Relative %: 83 %
Platelets: 66 10*3/uL — ABNORMAL LOW (ref 150–400)
RBC: 2.43 MIL/uL — ABNORMAL LOW (ref 3.87–5.11)
RDW: 19.3 % — ABNORMAL HIGH (ref 11.5–15.5)
WBC: 5.3 10*3/uL (ref 4.0–10.5)
nRBC: 1 % — ABNORMAL HIGH (ref 0.0–0.2)

## 2023-08-21 LAB — COMPREHENSIVE METABOLIC PANEL
ALT: 23 U/L (ref 0–44)
AST: 28 U/L (ref 15–41)
Albumin: 2.5 g/dL — ABNORMAL LOW (ref 3.5–5.0)
Alkaline Phosphatase: 81 U/L (ref 38–126)
Anion gap: 10 (ref 5–15)
BUN: 49 mg/dL — ABNORMAL HIGH (ref 8–23)
CO2: 32 mmol/L (ref 22–32)
Calcium: 8.1 mg/dL — ABNORMAL LOW (ref 8.9–10.3)
Chloride: 90 mmol/L — ABNORMAL LOW (ref 98–111)
Creatinine, Ser: 0.78 mg/dL (ref 0.44–1.00)
GFR, Estimated: >60 mL/min (ref >60)
Glucose, Bld: 98 mg/dL (ref 70–99)
Potassium: 6.7 mmol/L (ref 3.5–5.1)
Sodium: 132 mmol/L — ABNORMAL LOW (ref 135–145)
Total Bilirubin: 0.6 mg/dL (ref 0.0–1.2)
Total Protein: 6.2 g/dL — ABNORMAL LOW (ref 6.5–8.1)

## 2023-08-21 LAB — PROTIME-INR
INR: 3.4 — ABNORMAL HIGH (ref 0.8–1.2)
Prothrombin Time: 34.6 s — ABNORMAL HIGH (ref 11.4–15.2)

## 2023-08-21 MED ORDER — CALCIUM GLUCONATE-NACL 1-0.675 GM/50ML-% IV SOLN
1.0000 g | Freq: Once | INTRAVENOUS | Status: DC
  Administered 2023-08-21: 1000 mg via INTRAVENOUS
  Filled 2023-08-21: qty 50

## 2023-08-21 MED ORDER — SODIUM CHLORIDE 0.9 % IV BOLUS
1000.0000 mL | Freq: Once | INTRAVENOUS | Status: AC
  Administered 2023-08-21: 1000 mL via INTRAVENOUS

## 2023-08-21 MED ORDER — LORAZEPAM 2 MG/ML PO CONC: 0.6000 mg | Freq: Three times a day (TID) | ORAL | 0 refills | Status: AC

## 2023-08-21 MED ORDER — INSULIN ASPART 100 UNIT/ML IJ SOLN: 5.0000 [IU] | Freq: Once | INTRAMUSCULAR | Status: DC

## 2023-08-21 MED ORDER — DEXTROSE 50 % IV SOLN
1.0000 | Freq: Once | INTRAVENOUS | Status: DC
  Filled 2023-08-21: qty 50

## 2023-08-21 MED ORDER — SODIUM ZIRCONIUM CYCLOSILICATE 10 G PO PACK
10.0000 g | PACK | Freq: Once | ORAL | Status: DC
  Filled 2023-08-21: qty 1

## 2023-08-21 MED ORDER — GLYCOPYRROLATE 1 MG PO TABS: 1.0000 mg | ORAL_TABLET | Freq: Three times a day (TID) | ORAL | 0 refills | Status: AC | PRN

## 2023-08-21 MED ORDER — MORPHINE SULFATE 20 MG/5ML PO SOLN: 5.0000 mg | ORAL | 0 refills | Status: AC | PRN

## 2023-08-21 NOTE — ED Provider Notes (Signed)
 [place] EMERGENCY DEPARTMENT AT [facility] Provider Note   CSN: [id number] Arrival date & time: 08/21/23  1453     History  No chief complaint on file.   Tina Wade is a 89 y.o. female.  HPI    89 year old female with a history of congestive heart failure, hyperlipidemia, history of mechanical mitral valve replacement on Coumadin , CKD, hypothyroidism, recent admission from her skilled nursing facility to [facility] for encephalopathy secondary to UTI who presents with concern for hyperkalemia on outpatient labs.  During this admission she was noted to have dysphagia and had DHT placed for nutrition.   Labs showed an INR of 4.8, potassium of 7 with a normal creatinine of 0.8  Daughter reports she initially was seen with left hip pain and then developed shortness of breath and was admitted to the hospital, after that went to rehab and then ended up going back to [facility] with concern for encephalopathy and had DHT placed for dysphagia.  She just got home with her daughter on Sunday. Daughter notes that sicne her hospitalization she has been generally weak but deneis acute changes including no acute focal weakness, fever, nausea, vomiting, black or bloody stools, cough.   Home Medications Prior to Admission medications   Medication Sig Start Date End Date Taking? Authorizing Provider  glycopyrrolate  (ROBINUL ) 1 MG tablet Take 1 tablet (1 mg total) by mouth 3 (three) times daily as needed (secretions near end of life). 08/21/23  Yes Dreama Longs, MD  LORazepam  (ATIVAN ) 2 MG/ML concentrated solution Take 0.3 mLs (0.6 mg total) by mouth every 8 (eight) hours. 08/21/23  Yes Dreama Longs, MD  morphine  20 MG/5ML solution Take 1.3 mLs (5.2 mg total) by mouth every 2 (two) hours as needed for pain. 08/21/23  Yes Dreama Longs, MD  acetaminophen  (TYLENOL ) 325 MG tablet Take 650 mg by mouth every 6 (six) hours as needed for moderate pain (pain score 4-6), fever or  headache.    [provider]  albuterol  (VENTOLIN  HFA) 108 (90 Base) MCG/ACT inhaler Inhale 2 puffs into the lungs as needed. 10/04/18   [provider]  atorvastatin  (LIPITOR ) 80 MG tablet Take 1 tablet (80 mg total) by mouth daily. 08/10/23   Marylu Gee, DO  diclofenac  Sodium (VOLTAREN ) 1 % GEL Apply 2 g topically 2 (two) times daily at 10 AM and 5 PM. 08/09/23   Marylu Gee, DO  feeding supplement (ENSURE ENLIVE / ENSURE PLUS) LIQD Take 237 mLs by mouth 2 (two) times daily between meals. 08/09/23   Marylu Gee, DO  ferrous sulfate  325 (65 FE) MG tablet Take 325 mg by mouth daily.    [provider]  furosemide  (LASIX ) 40 MG tablet Take 40 mg by mouth 2 (two) times daily.    [provider]  HYDROcodone -acetaminophen  (NORCO/VICODIN) 5-325 MG tablet Take 1 tablet by mouth every 6 (six) hours as needed for severe pain (pain score 7-10). 08/09/23   Fernand Prost, MD  ipratropium-albuterol  (DUONEB) 0.5-2.5 (3) MG/3ML SOLN Take 3 mLs by nebulization every 6 (six) hours as needed. 08/09/23   Marylu Gee, DO  levETIRAcetam (KEPPRA XR) 750 MG 24 hr tablet Take 750 mg by mouth in the morning and at bedtime. 07/25/22   [provider]  levothyroxine  (SYNTHROID ) 100 MCG tablet Take 100 mcg by mouth daily before breakfast.    [provider]  ondansetron  (ZOFRAN -ODT) 4 MG disintegrating tablet 4mg  ODT q4 hours prn nausea/vomit 07/28/23   Floyd, Dan, DO  potassium chloride  (KLOR-CON ) 20 MEQ packet Take 20 mEq by mouth daily. 08/10/23   Marylu Gee, DO  senna-docusate (SENOKOT-S) 8.6-50 MG tablet Take 2 tablets by mouth 2 (two) times daily. 08/09/23   Marylu Gee, DO  spironolactone (ALDACTONE) 25 MG tablet Take 25 mg by mouth 2 (two) times daily. 09/22/19   [provider]  Tiotropium Bromide-Olodaterol (STIOLTO RESPIMAT) 2.5-2.5 MCG/ACT AERS Inhale 2 puffs into the lungs daily.  06/17/18   [provider]  triamcinolone cream (KENALOG) 0.1 %  Apply 1 application topically as needed for rash. 02/07/19   [provider]  warfarin (COUMADIN ) 2 MG tablet Take 2-4 mg by mouth See admin instructions. Take 1 tablet (2mg ) around noon every Sunday, Tuesday, Thursday, Saturday Take 2 tablets (4mg ) around noon every Monday, Wednesday, Friday.    [provider]      Allergies    Neurontin  [gabapentin ] and Percocet [oxycodone -acetaminophen ]    Review of Systems   Review of Systems  Physical Exam Updated Vital Signs BP (!) 130/51   Pulse 73   Temp (!) 97.4 F (36.3 C) (Axillary)   Resp (!) 30   SpO2 100%  Physical Exam Vitals and nursing note reviewed.  Constitutional:      General: She is not in acute distress.    Appearance: She is well-developed. She is ill-appearing and toxic-appearing. She is not diaphoretic.     Comments: Sleepy but wakes appropriately Generally weak  HENT:     Head: Normocephalic and atraumatic.  Eyes:     Conjunctiva/sclera: Conjunctivae normal.  Cardiovascular:     Rate and Rhythm: Normal rate and regular rhythm.     Heart sounds: Normal heart sounds. No murmur heard.    No friction rub. No gallop.  Pulmonary:     Effort: Pulmonary effort is normal. No respiratory distress.     Breath sounds: Normal breath sounds. No wheezing or rales.  Abdominal:     General: There is no distension.     Palpations: Abdomen is soft.     Tenderness: There is no abdominal tenderness. There is no guarding.  Musculoskeletal:        General: No tenderness.     Cervical back: Normal range of motion.  Skin:    General: Skin is warm and dry.     Findings: No erythema or rash.  Neurological:     Mental Status: She is alert and oriented to person, place, and time.     ED Results / Procedures / Treatments   Labs (all labs ordered are listed, but only abnormal results are displayed) Labs Reviewed  CBC WITH DIFFERENTIAL/PLATELET - Abnormal; Notable for the following components:      Result Value    RBC 2.43 (*)    Hemoglobin 8.1 (*)    HCT 26.5 (*)    MCV 109.1 (*)    RDW 19.3 (*)    Platelets 66 (*)    nRBC 1.0 (*)    Lymphs Abs 0.3 (*)    All other components within normal limits  COMPREHENSIVE METABOLIC PANEL - Abnormal; Notable for the following components:   Sodium 132 (*)    Potassium 6.7 (*)    Chloride 90 (*)    BUN 49 (*)    Calcium  8.1 (*)    Total Protein 6.2 (*)    Albumin 2.5 (*)    All other components within normal limits  PROTIME-INR - Abnormal; Notable for the following components:   Prothrombin Time 34.6 (*)  INR 3.4 (*)    All other components within normal limits  I-STAT CHEM 8, ED - Abnormal; Notable for the following components:   Sodium 129 (*)    Potassium 6.5 (*)    Chloride 90 (*)    BUN 48 (*)    Glucose, Bld 103 (*)    Calcium , Ion 0.95 (*)    TCO2 36 (*)    Hemoglobin 8.5 (*)    HCT 25.0 (*)    All other components within normal limits    EKG EKG Interpretation Date/Time:  Tuesday August 21 2023 15:37:20 EST Ventricular Rate:  68 PR Interval:    QRS Duration:  106 QT Interval:  416 QTC Calculation: 443 R Axis:   -9  Text Interpretation: Atrial fibrillation Rate has slowed, no significant changes Confirmed by Dreama Longs ([id number]) on 08/21/2023 3:39:21 PM  Radiology No results found.  Procedures Procedures    Medications Ordered in ED Medications  sodium chloride  0.9 % bolus 1,000 mL (0 mLs Intravenous Stopped 08/21/23 1905)    ED Course/ Medical Decision Making/ A&P                                   89 year old female with a history of congestive heart failure, hyperlipidemia, history of mechanical mitral valve replacement on Coumadin , CKD, hypothyroidism, recent admission from her skilled nursing facility to [facility] for encephalopathy secondary to UTI who presents with concern for hyperkalemia on outpatient labs.  Labs reviewed and significant for hyperkalemia and supratherapeutic INR however renal  function normal.  Ordered EKG and labs for repeat  EKG evaluated by me and shows atrial fibrillation without other significant changes.   Labs completed and personally about interpreted by me show stable anemia, new thrombocytopenia, hyperkalemia with a potassium of 6.7, creatinine improved to 0.78.  Unclear etiology at this time of her hyperkalemia, perhaps spironolactone is contributing as well as her tube feeds.  Discussed goals of care with patient and her daughter Deitra at bedside.  Ms. Zani is alert, oriented and able to make her own decisions.  She reports at this time that she would like only palliative care and for goals to shift from quantity of life to quality.  She is tired. She would like the Dobbhoff feeding tube removed.  She does not want treatment for hyperkalemia, antibiotics, other IV interventions or admission.  She would like to just go home, be with her family, and be made comfortable.  Discussed in detail and we all agree with her decision to be Comfort Care Only.    Consulted TOC/palliative and Randine Nail with hospice was able to discuss care and make plan to see her.    Given rx for morphine , ativan , glycopyrrolate  for pain, anxiety, secretions to use as needed.  She has DNR at bedside.  Plan for transportation home to be with family for comfort care.         Final Clinical Impression(s) / ED Diagnoses Final diagnoses:  Hyperkalemia  Palliative care patient    Rx / DC Orders ED Discharge Orders          Ordered    morphine  20 MG/5ML solution  Every 2 hours PRN        08/21/23 2112    LORazepam  (ATIVAN ) 2 MG/ML concentrated solution  Every 8 hours        08/21/23 2112    glycopyrrolate  (ROBINUL ) 1 MG  tablet  3 times daily PRN        08/21/23 2115              Dreama Longs, MD 08/22/23 [phone]

## 2023-08-21 NOTE — ED Notes (Signed)
Duodenum tube removed

## 2023-08-21 NOTE — ED Triage Notes (Signed)
Pt bib ems from home with abnormal labs. Today had home visit with PCP who did blood work. Per PCP INR and potassium are elevated.  120/57 HR 70 RR 18 100% 2L [place] (baseline)

## 2023-08-21 NOTE — Progress Notes (Signed)
 [facility] ED 024 [facility] Liaison Note  Received request for hospice services at home after discharge from Dr. Jeryl and Dr. Dreama, ED Provider. Winnie from [facility] was notified after I had already spoken to daughter.  Phone call to daughter Saddie to initiate education related to hospice philosophy, services and team approach to care. Daughter verbalized understanding of information given. Per discussion plan is for discharge home this evening.  DME needs discussed, patient has the following DME in the home. Hospital bed, O2 concentrator, feeding pump, wheelchair, walker, BSC, shower chair via [employer]. Address has been verified and is correct in the chart.  Please send completed and signed DNR with patient/family.   Please provide prescriptions at discharge as needed to ensure ongoing symptom management.  Winnie with TOC again, notified after discussions with daughter and aware of the above.  Please call with any questions or concerns.  Thank you for the opportunity to participate in this patients care.  Randine Nail, BSN, [employer] [phone]

## 2023-08-21 NOTE — Care Management (Signed)
 Transition of Care [facility]) - Emergency Department Mini Assessment   Patient Details  Name: Tina Wade MRN: [id number] Date of Birth: 1929-02-15  Transition of Care [facility]) CM/SW Contact:    Tina JONELLE Cory, RN Phone Number: 08/21/2023, 7:03 PM   Clinical Narrative:  Tina Wade consult for home hospice services. Thi RNCM spoke with patient's son Tina Wade who recommend speaking with his sister Tina Wade. This RNCM spoke with Tina Wade via phone# [phone] to offer choice for home hospice services. Tina Wade reports she just spoke with someone from hospice that will be coming to their home tomorrow and provided the hospice phone number.  This RNCM spoke with Tina Wade with [facility] who reports palliative provider contacted her re: hospice referral. TOC unable to offer choice patient's daughter agreed to remin with the hospice services she spoke with.   No additional TOC needs.  ED Mini Assessment: What brought you to the Emergency Department? : Patient from home via EMS with abnormal labs from PCP appt today.  Barriers to Discharge: No Barriers Identified  Barrier interventions: discussed home hospice  Means of departure: Ambulance  Interventions which prevented an admission or readmission: Hospice    Patient Contact and Communications     Spoke with: Tina Wade Pass Date: 08/21/23,   Contact time: 1902 Contact Phone Number: [phone] Call outcome: home hospice services  Patient states their goals for this hospitalization and ongoing recovery are:: to feel better CMS Medicare.gov Compare Post Acute Care list provided to:: Patient Represenative (must comment) Choice offered to / list presented to : Adult Children  Admission diagnosis:  abn labs Patient Active Problem List   Diagnosis Date Noted   Normocytic anemia 08/04/2023   Supratherapeutic INR 08/04/2023   Urinary tract infection 08/04/2023   Delirium 08/04/2023   Chronic kidney disease 08/02/2023   Acute hypoxic respiratory failure  (HCC) 08/01/2023   Moderate protein-calorie malnutrition (HCC) 08/01/2023   Congestive heart failure (HCC) 08/01/2023   Left displaced femoral neck fracture (HCC) 02/22/2019   Closed left hip fracture, initial encounter (HCC) 02/22/2019   Anticoagulated on warfarin    Nonrheumatic aortic valve stenosis 11/20/2018   Dyspnea on minimal exertion 11/07/2018   TIA (transient ischemic attack) 05/24/2018   HLD (hyperlipidemia) 05/24/2018   Reactive airway disease 05/24/2018   Chronic combined systolic and diastolic CHF, NYHA class 2 (HCC) 11/14/2017   COPD (chronic obstructive pulmonary disease) (HCC) 11/07/2016   Dysphagia 09/20/2016   SAH (subarachnoid hemorrhage) (HCC) 09/16/2015   Hypokalemia 09/12/2015   History of TIA (transient ischemic attack) 03/26/2014   Bilateral lower extremity edema - mild 05/02/2013   Cigarette smoker one half pack a day or less unmotivated he quit -  05/02/2013   Chronic atrial fibrillation (HCC) 04/30/2013   Essential hypertension 04/30/2013   Iron  deficiency anemia due to chronic blood loss 01/03/2013   Adenoma of left adrenal gland 12/22/2012   Long term (current) use of anticoagulants 10/03/2012   CVA (cerebral vascular accident) (HCC) 10/03/2012   H/O mitral valve replacement 10/03/2012   Gastrointestinal hemorrhage with melena 06/18/2012   Age-related osteoporosis without current pathological fracture 02/13/2012   Cardiomegaly 01/11/2012   Former smoker 04/04/2011   Old embolic stroke without late effect 10/18/2010   Hypothyroidism 08/04/2010   PCP:  Tina Lenis, MD Pharmacy:   [facility] [id number] [address] [place], [place] - [address] [place] [address] Phone: [phone] Fax: [phone]  [id number] - [place], [place] - [address] [place]  [place] [address] Phone: [phone] Fax: [phone]

## 2023-08-21 NOTE — ED Notes (Signed)
Pt had loose BM - peri care and pad change

## 2023-08-21 NOTE — ED Notes (Signed)
[facility] called to set up transport - no ETA at this time

## 2023-09-15 DEATH — deceased
